# Patient Record
Sex: Female | Born: 1935 | Race: White | Hispanic: No | Marital: Married | Smoking: Never smoker
Health system: Southern US, Community
[De-identification: ages and names within clinical notes are randomized; demographics above are authoritative.]

## PROBLEM LIST (undated history)

## (undated) DIAGNOSIS — I779 Disorder of arteries and arterioles, unspecified: Secondary | ICD-10-CM

## (undated) DIAGNOSIS — I739 Peripheral vascular disease, unspecified: Secondary | ICD-10-CM

## (undated) DIAGNOSIS — G8103 Flaccid hemiplegia affecting right nondominant side: Secondary | ICD-10-CM

## (undated) DIAGNOSIS — G20A1 Parkinson's disease without dyskinesia, without mention of fluctuations: Secondary | ICD-10-CM

## (undated) DIAGNOSIS — M6259 Muscle wasting and atrophy, not elsewhere classified, multiple sites: Secondary | ICD-10-CM

## (undated) DIAGNOSIS — I69320 Aphasia following cerebral infarction: Secondary | ICD-10-CM

## (undated) DIAGNOSIS — I639 Cerebral infarction, unspecified: Secondary | ICD-10-CM

## (undated) DIAGNOSIS — R41841 Cognitive communication deficit: Secondary | ICD-10-CM

## (undated) DIAGNOSIS — G20 Parkinson's disease: Secondary | ICD-10-CM

## (undated) DIAGNOSIS — R627 Adult failure to thrive: Secondary | ICD-10-CM

## (undated) DIAGNOSIS — E559 Vitamin D deficiency, unspecified: Secondary | ICD-10-CM

## (undated) DIAGNOSIS — N189 Chronic kidney disease, unspecified: Secondary | ICD-10-CM

## (undated) DIAGNOSIS — I10 Essential (primary) hypertension: Secondary | ICD-10-CM

## (undated) DIAGNOSIS — E785 Hyperlipidemia, unspecified: Secondary | ICD-10-CM

## (undated) HISTORY — DX: Flaccid hemiplegia affecting right nondominant side: G81.03

## (undated) HISTORY — DX: Aphasia following cerebral infarction: I69.320

## (undated) HISTORY — PX: CATARACT EXTRACTION: SUR2

## (undated) HISTORY — DX: Cerebral infarction, unspecified: I63.9

## (undated) HISTORY — DX: Essential (primary) hypertension: I10

## (undated) HISTORY — DX: Adult failure to thrive: R62.7

## (undated) HISTORY — DX: Hyperlipidemia, unspecified: E78.5

## (undated) HISTORY — DX: Disorder of arteries and arterioles, unspecified: I77.9

## (undated) HISTORY — DX: Parkinson's disease: G20

## (undated) HISTORY — DX: Muscle wasting and atrophy, not elsewhere classified, multiple sites: M62.59

## (undated) HISTORY — DX: Chronic kidney disease, unspecified: N18.9

## (undated) HISTORY — DX: Peripheral vascular disease, unspecified: I73.9

## (undated) HISTORY — DX: Vitamin D deficiency, unspecified: E55.9

## (undated) HISTORY — DX: Parkinson's disease without dyskinesia, without mention of fluctuations: G20.A1

## (undated) HISTORY — DX: Cognitive communication deficit: R41.841

---

## 1957-02-10 HISTORY — PX: THYROIDECTOMY: SHX17

## 1998-07-13 ENCOUNTER — Other Ambulatory Visit: Admission: RE | Admit: 1998-07-13 | Discharge: 1998-07-13 | Payer: Self-pay | Admitting: Internal Medicine

## 1999-12-04 ENCOUNTER — Other Ambulatory Visit: Admission: RE | Admit: 1999-12-04 | Discharge: 1999-12-04 | Payer: Self-pay | Admitting: Internal Medicine

## 2003-08-11 ENCOUNTER — Ambulatory Visit (HOSPITAL_COMMUNITY): Admission: RE | Admit: 2003-08-11 | Discharge: 2003-08-11 | Payer: Self-pay | Admitting: *Deleted

## 2007-04-29 ENCOUNTER — Encounter: Payer: Self-pay | Admitting: Internal Medicine

## 2007-05-06 ENCOUNTER — Ambulatory Visit (HOSPITAL_COMMUNITY): Admission: RE | Admit: 2007-05-06 | Discharge: 2007-05-06 | Payer: Self-pay | Admitting: Interventional Radiology

## 2008-02-14 ENCOUNTER — Ambulatory Visit: Payer: Self-pay | Admitting: Internal Medicine

## 2008-12-11 HISTORY — PX: NM MYOCAR PERF WALL MOTION: HXRAD629

## 2009-01-12 ENCOUNTER — Emergency Department (HOSPITAL_COMMUNITY): Admission: EM | Admit: 2009-01-12 | Discharge: 2009-01-12 | Payer: Self-pay | Admitting: Emergency Medicine

## 2010-05-09 HISTORY — PX: US ECHOCARDIOGRAPHY: HXRAD669

## 2010-05-14 LAB — POCT I-STAT, CHEM 8
BUN: 32 mg/dL — ABNORMAL HIGH (ref 6–23)
Chloride: 108 mEq/L (ref 96–112)
HCT: 34 % — ABNORMAL LOW (ref 36.0–46.0)
Potassium: 4 mEq/L (ref 3.5–5.1)

## 2010-05-14 LAB — DIFFERENTIAL
Basophils Absolute: 0 10*3/uL (ref 0.0–0.1)
Basophils Relative: 0 % (ref 0–1)
Lymphocytes Relative: 20 % (ref 12–46)
Monocytes Absolute: 0.5 10*3/uL (ref 0.1–1.0)
Neutro Abs: 5.5 10*3/uL (ref 1.7–7.7)
Neutrophils Relative %: 71 % (ref 43–77)

## 2010-05-14 LAB — POCT CARDIAC MARKERS
CKMB, poc: 1.4 ng/mL (ref 1.0–8.0)
Myoglobin, poc: 160 ng/mL (ref 12–200)
Troponin i, poc: <0.05 ng/mL (ref 0.00–0.09)

## 2010-05-14 LAB — CBC
Hemoglobin: 11.4 g/dL — ABNORMAL LOW (ref 12.0–15.0)
MCHC: 34 g/dL (ref 30.0–36.0)
RBC: 3.63 MIL/uL — ABNORMAL LOW (ref 3.87–5.11)
RDW: 13.5 % (ref 11.5–15.5)

## 2010-06-25 NOTE — Consult Note (Signed)
NAME:  Maria Hanna, Maria Hanna               ACCOUNT NO.:  [id number]   MEDICAL RECORD NO.:  [id number]          PATIENT TYPE:  OUT   LOCATION:  XRAY                         FACILITY:  [facility]   PHYSICIAN:  Delton See, P.A.   DATE OF BIRTH:  Sep 04, 1925   DATE OF CONSULTATION:  04/29/2007  DATE OF DISCHARGE:                                 CONSULTATION   CHIEF COMPLAINT:  Cerebrovascular disease.   HISTORY OF PRESENT ILLNESS:  This is a very pleasant 77 year old female  referred to Dr. Corliss Skains through the courtesy of Dr. Jerrel Ivory after  the patient was admitted to [facility] on April 24, 2007 with a  left CVA.  The patient fortunately has coordination problems with her  right hand.   During her stay at [facility] she had an MRI, MRA that did reveal  an occluded left internal carotid artery as well as an occluded right  vertebral artery. Cerebral angiogram has been recommended.  The patient  has been treated with aspirin and Plavix.  She has had no further  symptoms.  She presents today accompanied by her husband to meet with  Dr. Corliss Skains to discuss further treatment recommendations.   PAST MEDICAL HISTORY:  Significant for hyperlipidemia, her recent left  CVA, hypertension, osteoporosis.  She has previously seen Dr. Jacinto Halim for  a 2-D echo that revealed minimal valvular abnormalities with an ejection  fraction of 70%.  She also previously had a treadmill stress test.   PAST SURGICAL HISTORY:  Significant for a subtotal thyroidectomy as well  as cataract surgery.  The patient denies previous problems with  anesthesia.   ALLERGIES:  No known drug allergies.  She denies allergies to latex,  shrimp, iodine or shellfish.   CURRENT MEDICATIONS:  Include Plavix 75 mg daily, calcium 500 mg daily,  Fosamax once weekly, Lovaza 1000 mg daily, Crestor 5 mg daily, Toprol XL  50 mg daily, Xforge 160 mg daily, a vitamin for her eyes one daily, and  aspirin 325 mg daily.  The  patient has never smoked.  She does not use  alcohol.  She is retired from the [employer] and she also worked  at the [employer]   FAMILY HISTORY:  Mother died at age 59 from natural causes. Her father  died at age 55 from renal cancer.   IMPRESSION:  As noted the patient presents today for further discussions  with Dr. Corliss Skains regarding her cerebrovascular disease.  Dr. Corliss Skains  reviewed the results of the MRI, MRA performed April 26, 2007 at  [facility].  He pointed out the occluded left internal carotid  artery as well as the suspected occluded right vertebral artery.  Based  on the MRI, MRA results it could not be determined with certainty that  these arteries were occluded.  Therefore a cerebral angiogram has been  recommended.  The cerebral angiogram was described in detail  along with the risks and potential benefits. All of their questions have  been answered.  We also discussed possible PTA stenting if felt to be  safe and indicated.  We have tentatively scheduled the patient for  cerebral angiogram next Thursday. Greater than 40 minutes was spent on  this consult.      Delton See, P.A.     DR/MEDQ  D:  04/29/2007  T:  04/30/2007  Job:  [id number]   cc:   Victoriano Lain, MD  Lenon Curt. Chilton Si, M.D.  Jerrel Ivory, MD  Cristy Hilts. Jacinto Halim, MD

## 2010-06-25 NOTE — Assessment & Plan Note (Signed)
[facility]                         ELECTROPHYSIOLOGY OFFICE NOTE   NAME:JOHNSONYudith, Norlander                      MRN:          [id number]  DATE:02/14/2008                            DOB:          [birth year]    HISTORY OF PRESENT ILLNESS:  Ms. Texidor is a new patient of mine.  She  is referred for evaluation of a tachy-brady symptoms.  The patient has a  history of stroke and she has known carotid and vertebral artery disease  with documented occluded right vertebral artery and an occluded left  internal carotid by MRI and cerebral angiogram.  For this, she has been  treated with aspirin and Plavix.  She has rare palpitations.  She has  never had frank syncope.  She was seen by Dr. Yates Decamp and had a 3-week  CardioNet monitor obtained, this demonstrated asymptomatic bradycardia  down into the 50s.  It also demonstrated nonsustained tachycardia at a  rate of 130 beats per minute.  This was classified as ventricular  tachycardia though it certainly could be apparent SVT.  The etiology of  the arrhythmia is unclear.  Despite this, the patient has never had  frank syncope and really does not feel much in the way of symptomatic  palpitations.  If anything, these are not particularly bothersome to her  and are not associated with much in the way of symptoms.  The patient  recently underwent stress Myoview testing, which demonstrated normal LV  systolic function and normal perfusion in all coronary distribution.  The patient had normal renal arteries on vascular study.  She denies  chest pain or shortness of breath and she denies frank syncope.   PAST MEDICAL HISTORY:  Notable for hypertension.  She has dyslipidemia.  She has a history of osteoporosis.   PAST SURGICAL HISTORY:  Notable for subtotal thyroidectomy and cataract  surgery in the past.   ALLERGIES:  She had no known drug allergies.   MEDICATIONS:  Her medications include,  1. Lovaza 1 g 4 times  daily.  2. Maxzide 25 mg half tablet daily.  3. Lisinopril 40 a day.  4. Plavix 75 a day.  5. Aspirin 81 a day.  6. Fosamax 70 a day.   FAMILY HISTORY:  Noncontributory at her advanced age.   SOCIAL HISTORY:  The patient denies tobacco or ethanol abuse.   REVIEW OF SYSTEMS:  Review of systems are negative except as noted in  the HPI.   PHYSICAL EXAMINATION:  GENERAL:  She is a pleasant, elderly-appearing  woman, in no acute distress.  VITAL SIGNS:  The blood pressure was 100/60, pulse was 77 and regular,  respirations were 18, the weight was 135 pounds.  HEENT:  Normocephalic  and atraumatic.  She is wearing glasses.  The oropharynx is moist.  Sclerae anicteric.  NECK:  Revealed no jugular venous distention.  There is no thyromegaly.  Trachea was midline.  LUNGS:  Clear bilaterally to auscultation.  No wheezes, rales, or  rhonchi were appreciated.  There is no increased work of breathing.  CARDIOVASCULAR:  Revealed a regular rate and rhythm.  Normal S1 and S2.  The PMI is not enlarged nor was it laterally displaced.  ABDOMEN:  Soft and nontender.  There is no organomegaly.  EXTREMITIES:  Demonstrated no cyanosis, clubbing, or edema.  NEUROLOGIC:  The patient's right arm and right leg were minimally weaker  than the left.  She has a slight tremor on the right arm.  Otherwise,  neurologic exam was normal.   IMPRESSION:  1. Minimally symptomatic palpitations.  2. Cerebral vascular disease with an occluded carotid as well as      vertebral artery.  3. Wide QRS tachycardia most likely nonsustained atrial flutter versus      supraventricular tachycardia with aberration versus ventricular      tachycardia.  4. Sinus bradycardia, also minimally if at all symptomatic.   DISCUSSION:  I have discussed treatment options with the patient in  detail. The thing that I am most struck by is her paucity of symptoms.  I am not sure whether she is just playing this down or whether she truly   is asymptomatic, but as best I can tell she is minimally if at all  symptomatic from this.  With her cerebrovascular disease, aspirin and  Plavix are indicated and I think there is very little if any advantage,  maybe of significant disadvantage in terms of bleeding risk by  initiation of Coumadin, because she has not had much documented in the  way of symptomatic arrhythmias, I would recommend at this point a period  of watchful waiting.  I would be reluctant to recommend catheter  ablation, as I think the likelihood of reducing her thromboembolic risk  with this is minimal and that she is fairly asymptomatic suggested we  would not be doing much for her symptoms.  With regard to her  bradycardia, again she is minimally if at all symptomatic from  bradycardia, and I think that some day, she may ultimately require  permanent pacemaker insertion, she presently does not have a clear-cut  indication for one.  I will plan on discussing all these issues with her  primary cardiologist, Dr. Jacinto Halim and we will see her back as needed.     Doylene Canning. Ladona Ridgel, MD  Electronically Signed    GWT/MedQ  DD: 02/14/2008  DT: 02/15/2008  Job #: [id number]   cc:   Cristy Hilts. Jacinto Halim, MD  Lonie Peak, PA

## 2010-06-28 NOTE — Op Note (Signed)
NAME:  Maria Hanna, Maria Hanna                         ACCOUNT NO.:  [id number]   MEDICAL RECORD NO.:  [id number]                   PATIENT TYPE:  AMB   LOCATION:  ENDO                                 FACILITY:  [facility]   PHYSICIAN:  Georgiana Spinner, M.D.                 DATE OF BIRTH:  09/06/1925   DATE OF PROCEDURE:  08/11/2003  DATE OF DISCHARGE:                                 OPERATIVE REPORT   ADDENDUM:                                               Georgiana Spinner, M.D.    GMO/MEDQ  D:  08/11/2003  T:  08/11/2003  Job:  [id number]   cc:   Lenon Curt. Chilton Si, M.D.  [address]  [place]  [place] [address]  Fax: [phone]

## 2010-06-28 NOTE — Op Note (Signed)
NAME:  Maria Hanna, Maria Hanna                         ACCOUNT NO.:  [id number]   MEDICAL RECORD NO.:  [id number]                   PATIENT TYPE:  AMB   LOCATION:  ENDO                                 FACILITY:  [facility]   PHYSICIAN:  Georgiana Spinner, M.D.                 DATE OF BIRTH:  09/27/1925   DATE OF PROCEDURE:  DATE OF DISCHARGE:                                 OPERATIVE REPORT   PROCEDURE:  Colonoscopy.   INDICATIONS:  Colon polyp.   ANESTHESIA:  Demerol 60 mg, Versed 6 mg.   DESCRIPTION OF PROCEDURE:  With the patient mildly sedated and in the left  lateral decubitus position, the Olympus videoscopic colonoscope was inserted  in the rectum and passed through a tortuous sigmoid colon to the cecum,  identified by ileocecal valve and appendiceal orifice, both of which were  photographed.  From this point,  the colonoscope was slowly withdrawn,  taking circumferential views of the colonic mucosa, stopping only in the  rectum which appeared normal on direct and showed hemorrhoids on retroflexed  view.  The endoscope was straightened and withdrawn.  The patient's vital  signs and pulse oximetry remained stable.  The patient tolerated the  procedure well without apparent complications.   FINDINGS:  Internal hemorrhoids and some diverticula seen in the sigmoid  colon; otherwise unremarkable exam.   PLAN:  Repeat examination possibly in five years.                                               Georgiana Spinner, M.D.    GMO/MEDQ  D:  08/11/2003  T:  08/11/2003  Job:  [id number]

## 2010-11-04 LAB — BASIC METABOLIC PANEL
BUN: 11
Calcium: 9.1
Creatinine, Ser: 0.81
GFR calc Af Amer: >60
GFR calc non Af Amer: >60

## 2010-11-04 LAB — PROTIME-INR
INR: 0.9
Prothrombin Time: 11.8

## 2010-11-04 LAB — CBC
MCV: 90.3
Platelets: 244
RBC: 4.06
WBC: 6.8

## 2011-02-19 DIAGNOSIS — M999 Biomechanical lesion, unspecified: Secondary | ICD-10-CM | POA: Diagnosis not present

## 2011-02-19 DIAGNOSIS — M5430 Sciatica, unspecified side: Secondary | ICD-10-CM | POA: Diagnosis not present

## 2011-03-10 DIAGNOSIS — D500 Iron deficiency anemia secondary to blood loss (chronic): Secondary | ICD-10-CM | POA: Diagnosis not present

## 2011-03-24 DIAGNOSIS — I6529 Occlusion and stenosis of unspecified carotid artery: Secondary | ICD-10-CM | POA: Diagnosis not present

## 2011-03-24 DIAGNOSIS — I119 Hypertensive heart disease without heart failure: Secondary | ICD-10-CM | POA: Diagnosis not present

## 2011-04-09 DIAGNOSIS — D500 Iron deficiency anemia secondary to blood loss (chronic): Secondary | ICD-10-CM | POA: Diagnosis not present

## 2011-04-16 DIAGNOSIS — H3530 Unspecified macular degeneration: Secondary | ICD-10-CM | POA: Diagnosis not present

## 2011-04-16 DIAGNOSIS — Z961 Presence of intraocular lens: Secondary | ICD-10-CM | POA: Diagnosis not present

## 2011-04-21 DIAGNOSIS — M5430 Sciatica, unspecified side: Secondary | ICD-10-CM | POA: Diagnosis not present

## 2011-04-21 DIAGNOSIS — M999 Biomechanical lesion, unspecified: Secondary | ICD-10-CM | POA: Diagnosis not present

## 2011-04-29 DIAGNOSIS — R195 Other fecal abnormalities: Secondary | ICD-10-CM | POA: Diagnosis not present

## 2011-04-29 DIAGNOSIS — D509 Iron deficiency anemia, unspecified: Secondary | ICD-10-CM | POA: Diagnosis not present

## 2011-05-06 DIAGNOSIS — I10 Essential (primary) hypertension: Secondary | ICD-10-CM | POA: Diagnosis not present

## 2011-05-06 DIAGNOSIS — M549 Dorsalgia, unspecified: Secondary | ICD-10-CM | POA: Diagnosis not present

## 2011-05-06 DIAGNOSIS — G20 Parkinson's disease: Secondary | ICD-10-CM | POA: Diagnosis not present

## 2011-05-06 DIAGNOSIS — I69959 Hemiplegia and hemiparesis following unspecified cerebrovascular disease affecting unspecified side: Secondary | ICD-10-CM | POA: Diagnosis not present

## 2011-05-06 DIAGNOSIS — Z79899 Other long term (current) drug therapy: Secondary | ICD-10-CM | POA: Diagnosis not present

## 2011-05-06 DIAGNOSIS — E782 Mixed hyperlipidemia: Secondary | ICD-10-CM | POA: Diagnosis not present

## 2011-05-21 DIAGNOSIS — M999 Biomechanical lesion, unspecified: Secondary | ICD-10-CM | POA: Diagnosis not present

## 2011-05-21 DIAGNOSIS — M5430 Sciatica, unspecified side: Secondary | ICD-10-CM | POA: Diagnosis not present

## 2011-06-18 DIAGNOSIS — M5430 Sciatica, unspecified side: Secondary | ICD-10-CM | POA: Diagnosis not present

## 2011-06-18 DIAGNOSIS — D500 Iron deficiency anemia secondary to blood loss (chronic): Secondary | ICD-10-CM | POA: Diagnosis not present

## 2011-06-18 DIAGNOSIS — M999 Biomechanical lesion, unspecified: Secondary | ICD-10-CM | POA: Diagnosis not present

## 2011-07-11 DIAGNOSIS — Z01818 Encounter for other preprocedural examination: Secondary | ICD-10-CM | POA: Diagnosis not present

## 2011-07-11 DIAGNOSIS — IMO0002 Reserved for concepts with insufficient information to code with codable children: Secondary | ICD-10-CM | POA: Diagnosis not present

## 2011-07-11 DIAGNOSIS — I495 Sick sinus syndrome: Secondary | ICD-10-CM | POA: Diagnosis not present

## 2011-07-11 DIAGNOSIS — M479 Spondylosis, unspecified: Secondary | ICD-10-CM | POA: Diagnosis not present

## 2011-07-11 DIAGNOSIS — M1710 Unilateral primary osteoarthritis, unspecified knee: Secondary | ICD-10-CM | POA: Diagnosis not present

## 2011-07-16 DIAGNOSIS — M5430 Sciatica, unspecified side: Secondary | ICD-10-CM | POA: Diagnosis not present

## 2011-07-16 DIAGNOSIS — M999 Biomechanical lesion, unspecified: Secondary | ICD-10-CM | POA: Diagnosis not present

## 2011-07-17 DIAGNOSIS — M1710 Unilateral primary osteoarthritis, unspecified knee: Secondary | ICD-10-CM | POA: Diagnosis not present

## 2011-08-13 DIAGNOSIS — M5430 Sciatica, unspecified side: Secondary | ICD-10-CM | POA: Diagnosis not present

## 2011-08-13 DIAGNOSIS — M999 Biomechanical lesion, unspecified: Secondary | ICD-10-CM | POA: Diagnosis not present

## 2011-08-18 DIAGNOSIS — M1710 Unilateral primary osteoarthritis, unspecified knee: Secondary | ICD-10-CM | POA: Diagnosis not present

## 2011-09-10 DIAGNOSIS — M5430 Sciatica, unspecified side: Secondary | ICD-10-CM | POA: Diagnosis not present

## 2011-09-10 DIAGNOSIS — M999 Biomechanical lesion, unspecified: Secondary | ICD-10-CM | POA: Diagnosis not present

## 2011-09-18 DIAGNOSIS — D500 Iron deficiency anemia secondary to blood loss (chronic): Secondary | ICD-10-CM | POA: Diagnosis not present

## 2011-09-30 DIAGNOSIS — I6529 Occlusion and stenosis of unspecified carotid artery: Secondary | ICD-10-CM | POA: Diagnosis not present

## 2011-10-14 DIAGNOSIS — D649 Anemia, unspecified: Secondary | ICD-10-CM | POA: Diagnosis not present

## 2011-10-14 DIAGNOSIS — K5900 Constipation, unspecified: Secondary | ICD-10-CM | POA: Diagnosis not present

## 2011-10-14 DIAGNOSIS — D500 Iron deficiency anemia secondary to blood loss (chronic): Secondary | ICD-10-CM | POA: Diagnosis not present

## 2011-10-27 DIAGNOSIS — G609 Hereditary and idiopathic neuropathy, unspecified: Secondary | ICD-10-CM | POA: Diagnosis not present

## 2011-10-27 DIAGNOSIS — M159 Polyosteoarthritis, unspecified: Secondary | ICD-10-CM | POA: Diagnosis not present

## 2011-10-27 DIAGNOSIS — M25561 Pain in right knee: Secondary | ICD-10-CM | POA: Insufficient documentation

## 2011-10-27 DIAGNOSIS — M25569 Pain in unspecified knee: Secondary | ICD-10-CM | POA: Diagnosis not present

## 2011-11-05 DIAGNOSIS — M5430 Sciatica, unspecified side: Secondary | ICD-10-CM | POA: Diagnosis not present

## 2011-11-05 DIAGNOSIS — M999 Biomechanical lesion, unspecified: Secondary | ICD-10-CM | POA: Diagnosis not present

## 2011-11-06 DIAGNOSIS — I69959 Hemiplegia and hemiparesis following unspecified cerebrovascular disease affecting unspecified side: Secondary | ICD-10-CM | POA: Diagnosis not present

## 2011-11-06 DIAGNOSIS — E782 Mixed hyperlipidemia: Secondary | ICD-10-CM | POA: Diagnosis not present

## 2011-11-06 DIAGNOSIS — I10 Essential (primary) hypertension: Secondary | ICD-10-CM | POA: Diagnosis not present

## 2011-11-06 DIAGNOSIS — Z23 Encounter for immunization: Secondary | ICD-10-CM | POA: Diagnosis not present

## 2011-11-06 DIAGNOSIS — D509 Iron deficiency anemia, unspecified: Secondary | ICD-10-CM | POA: Diagnosis not present

## 2011-12-03 DIAGNOSIS — M999 Biomechanical lesion, unspecified: Secondary | ICD-10-CM | POA: Diagnosis not present

## 2011-12-03 DIAGNOSIS — M5430 Sciatica, unspecified side: Secondary | ICD-10-CM | POA: Diagnosis not present

## 2011-12-10 DIAGNOSIS — G8929 Other chronic pain: Secondary | ICD-10-CM | POA: Diagnosis not present

## 2011-12-10 DIAGNOSIS — M1710 Unilateral primary osteoarthritis, unspecified knee: Secondary | ICD-10-CM | POA: Diagnosis not present

## 2011-12-10 DIAGNOSIS — G609 Hereditary and idiopathic neuropathy, unspecified: Secondary | ICD-10-CM | POA: Diagnosis not present

## 2011-12-10 DIAGNOSIS — G5790 Unspecified mononeuropathy of unspecified lower limb: Secondary | ICD-10-CM | POA: Diagnosis not present

## 2011-12-10 DIAGNOSIS — M25569 Pain in unspecified knee: Secondary | ICD-10-CM | POA: Diagnosis not present

## 2011-12-12 DIAGNOSIS — I10 Essential (primary) hypertension: Secondary | ICD-10-CM | POA: Diagnosis not present

## 2011-12-12 DIAGNOSIS — R404 Transient alteration of awareness: Secondary | ICD-10-CM | POA: Diagnosis not present

## 2011-12-12 DIAGNOSIS — R5383 Other fatigue: Secondary | ICD-10-CM | POA: Diagnosis not present

## 2011-12-12 DIAGNOSIS — I69998 Other sequelae following unspecified cerebrovascular disease: Secondary | ICD-10-CM | POA: Diagnosis not present

## 2011-12-12 DIAGNOSIS — R5381 Other malaise: Secondary | ICD-10-CM | POA: Diagnosis not present

## 2011-12-12 DIAGNOSIS — D649 Anemia, unspecified: Secondary | ICD-10-CM | POA: Diagnosis not present

## 2011-12-12 DIAGNOSIS — R55 Syncope and collapse: Secondary | ICD-10-CM | POA: Diagnosis not present

## 2011-12-12 DIAGNOSIS — I2510 Atherosclerotic heart disease of native coronary artery without angina pectoris: Secondary | ICD-10-CM | POA: Diagnosis not present

## 2011-12-12 DIAGNOSIS — A499 Bacterial infection, unspecified: Secondary | ICD-10-CM | POA: Diagnosis not present

## 2011-12-12 DIAGNOSIS — N390 Urinary tract infection, site not specified: Secondary | ICD-10-CM | POA: Diagnosis not present

## 2011-12-29 DIAGNOSIS — M159 Polyosteoarthritis, unspecified: Secondary | ICD-10-CM | POA: Diagnosis not present

## 2011-12-29 DIAGNOSIS — M25569 Pain in unspecified knee: Secondary | ICD-10-CM | POA: Diagnosis not present

## 2012-02-13 DIAGNOSIS — G894 Chronic pain syndrome: Secondary | ICD-10-CM

## 2012-02-13 DIAGNOSIS — G5790 Unspecified mononeuropathy of unspecified lower limb: Secondary | ICD-10-CM

## 2012-02-13 DIAGNOSIS — M179 Osteoarthritis of knee, unspecified: Secondary | ICD-10-CM | POA: Insufficient documentation

## 2012-02-13 DIAGNOSIS — M1710 Unilateral primary osteoarthritis, unspecified knee: Secondary | ICD-10-CM | POA: Diagnosis not present

## 2012-02-13 DIAGNOSIS — M25569 Pain in unspecified knee: Secondary | ICD-10-CM | POA: Diagnosis not present

## 2012-02-13 HISTORY — DX: Osteoarthritis of knee, unspecified: M17.9

## 2012-02-13 HISTORY — DX: Unspecified mononeuropathy of unspecified lower limb: G57.90

## 2012-02-13 HISTORY — DX: Chronic pain syndrome: G89.4

## 2012-02-23 DIAGNOSIS — D500 Iron deficiency anemia secondary to blood loss (chronic): Secondary | ICD-10-CM | POA: Diagnosis not present

## 2012-03-01 DIAGNOSIS — N390 Urinary tract infection, site not specified: Secondary | ICD-10-CM | POA: Diagnosis not present

## 2012-03-01 DIAGNOSIS — G20 Parkinson's disease: Secondary | ICD-10-CM | POA: Diagnosis not present

## 2012-03-01 DIAGNOSIS — D509 Iron deficiency anemia, unspecified: Secondary | ICD-10-CM | POA: Diagnosis not present

## 2012-03-01 DIAGNOSIS — R55 Syncope and collapse: Secondary | ICD-10-CM | POA: Diagnosis not present

## 2012-03-02 DIAGNOSIS — D500 Iron deficiency anemia secondary to blood loss (chronic): Secondary | ICD-10-CM | POA: Diagnosis not present

## 2012-03-02 DIAGNOSIS — R195 Other fecal abnormalities: Secondary | ICD-10-CM | POA: Diagnosis not present

## 2012-03-11 ENCOUNTER — Other Ambulatory Visit (HOSPITAL_COMMUNITY): Payer: Self-pay | Admitting: Cardiovascular Disease

## 2012-03-11 DIAGNOSIS — I119 Hypertensive heart disease without heart failure: Secondary | ICD-10-CM | POA: Diagnosis not present

## 2012-03-11 DIAGNOSIS — R5383 Other fatigue: Secondary | ICD-10-CM | POA: Diagnosis not present

## 2012-03-11 DIAGNOSIS — Z79899 Other long term (current) drug therapy: Secondary | ICD-10-CM | POA: Diagnosis not present

## 2012-03-11 DIAGNOSIS — I6529 Occlusion and stenosis of unspecified carotid artery: Secondary | ICD-10-CM

## 2012-03-11 DIAGNOSIS — I359 Nonrheumatic aortic valve disorder, unspecified: Secondary | ICD-10-CM

## 2012-03-11 DIAGNOSIS — E782 Mixed hyperlipidemia: Secondary | ICD-10-CM | POA: Diagnosis not present

## 2012-03-25 ENCOUNTER — Ambulatory Visit (HOSPITAL_COMMUNITY): Payer: Self-pay

## 2012-03-25 ENCOUNTER — Inpatient Hospital Stay (HOSPITAL_COMMUNITY): Admission: RE | Admit: 2012-03-25 | Payer: Self-pay | Source: Ambulatory Visit

## 2012-03-29 ENCOUNTER — Ambulatory Visit (HOSPITAL_COMMUNITY)
Admission: RE | Admit: 2012-03-29 | Discharge: 2012-03-29 | Disposition: A | Discharge status: Home / Self Care | Payer: Medicare Other | Source: Ambulatory Visit | Attending: Cardiovascular Disease | Admitting: Cardiovascular Disease

## 2012-03-29 DIAGNOSIS — I369 Nonrheumatic tricuspid valve disorder, unspecified: Secondary | ICD-10-CM | POA: Insufficient documentation

## 2012-03-29 DIAGNOSIS — I059 Rheumatic mitral valve disease, unspecified: Secondary | ICD-10-CM | POA: Diagnosis not present

## 2012-03-29 DIAGNOSIS — E782 Mixed hyperlipidemia: Secondary | ICD-10-CM | POA: Insufficient documentation

## 2012-03-29 DIAGNOSIS — I119 Hypertensive heart disease without heart failure: Secondary | ICD-10-CM

## 2012-03-29 DIAGNOSIS — I359 Nonrheumatic aortic valve disorder, unspecified: Secondary | ICD-10-CM | POA: Diagnosis not present

## 2012-03-29 DIAGNOSIS — I319 Disease of pericardium, unspecified: Secondary | ICD-10-CM | POA: Insufficient documentation

## 2012-03-29 DIAGNOSIS — I6529 Occlusion and stenosis of unspecified carotid artery: Secondary | ICD-10-CM | POA: Diagnosis not present

## 2012-03-29 NOTE — Progress Notes (Signed)
2D Echo Performed 03/29/2012    Davell Beckstead, RCS  

## 2012-03-29 NOTE — Progress Notes (Signed)
Carotid Duplex Complete Maria Hanna 

## 2012-04-01 DIAGNOSIS — M25569 Pain in unspecified knee: Secondary | ICD-10-CM | POA: Diagnosis not present

## 2012-04-01 DIAGNOSIS — G894 Chronic pain syndrome: Secondary | ICD-10-CM | POA: Diagnosis not present

## 2012-04-01 DIAGNOSIS — M1710 Unilateral primary osteoarthritis, unspecified knee: Secondary | ICD-10-CM | POA: Diagnosis not present

## 2012-04-06 DIAGNOSIS — D509 Iron deficiency anemia, unspecified: Secondary | ICD-10-CM | POA: Diagnosis not present

## 2012-04-20 DIAGNOSIS — Z961 Presence of intraocular lens: Secondary | ICD-10-CM | POA: Diagnosis not present

## 2012-04-20 DIAGNOSIS — H3530 Unspecified macular degeneration: Secondary | ICD-10-CM | POA: Diagnosis not present

## 2012-04-29 DIAGNOSIS — M25569 Pain in unspecified knee: Secondary | ICD-10-CM | POA: Diagnosis not present

## 2012-04-29 DIAGNOSIS — D500 Iron deficiency anemia secondary to blood loss (chronic): Secondary | ICD-10-CM | POA: Diagnosis not present

## 2012-04-29 DIAGNOSIS — M1710 Unilateral primary osteoarthritis, unspecified knee: Secondary | ICD-10-CM | POA: Diagnosis not present

## 2012-04-29 DIAGNOSIS — G894 Chronic pain syndrome: Secondary | ICD-10-CM | POA: Diagnosis not present

## 2012-05-05 DIAGNOSIS — I10 Essential (primary) hypertension: Secondary | ICD-10-CM | POA: Diagnosis not present

## 2012-05-05 DIAGNOSIS — D509 Iron deficiency anemia, unspecified: Secondary | ICD-10-CM | POA: Diagnosis not present

## 2012-05-05 DIAGNOSIS — K2970 Gastritis, unspecified, without bleeding: Secondary | ICD-10-CM | POA: Diagnosis not present

## 2012-05-05 DIAGNOSIS — E782 Mixed hyperlipidemia: Secondary | ICD-10-CM | POA: Diagnosis not present

## 2012-05-07 DIAGNOSIS — I6529 Occlusion and stenosis of unspecified carotid artery: Secondary | ICD-10-CM | POA: Diagnosis not present

## 2012-05-07 DIAGNOSIS — E785 Hyperlipidemia, unspecified: Secondary | ICD-10-CM | POA: Diagnosis not present

## 2012-05-07 DIAGNOSIS — I119 Hypertensive heart disease without heart failure: Secondary | ICD-10-CM | POA: Diagnosis not present

## 2012-07-14 DIAGNOSIS — M1710 Unilateral primary osteoarthritis, unspecified knee: Secondary | ICD-10-CM | POA: Diagnosis not present

## 2012-07-14 DIAGNOSIS — G894 Chronic pain syndrome: Secondary | ICD-10-CM | POA: Diagnosis not present

## 2012-07-28 DIAGNOSIS — D509 Iron deficiency anemia, unspecified: Secondary | ICD-10-CM | POA: Diagnosis not present

## 2012-08-03 ENCOUNTER — Other Ambulatory Visit: Payer: Self-pay | Admitting: *Deleted

## 2012-08-03 MED ORDER — FENOFIBRATE 145 MG PO TABS: 145.0000 mg | ORAL_TABLET | Freq: Every day | ORAL | Status: DC

## 2012-08-03 NOTE — Telephone Encounter (Signed)
Fenofibrate refilled electronically

## 2012-08-10 DIAGNOSIS — J209 Acute bronchitis, unspecified: Secondary | ICD-10-CM | POA: Diagnosis not present

## 2012-08-10 DIAGNOSIS — H6120 Impacted cerumen, unspecified ear: Secondary | ICD-10-CM | POA: Diagnosis not present

## 2012-08-16 DIAGNOSIS — N189 Chronic kidney disease, unspecified: Secondary | ICD-10-CM | POA: Diagnosis not present

## 2012-08-16 DIAGNOSIS — E0789 Other specified disorders of thyroid: Secondary | ICD-10-CM | POA: Diagnosis not present

## 2012-08-16 DIAGNOSIS — D649 Anemia, unspecified: Secondary | ICD-10-CM | POA: Diagnosis not present

## 2012-08-23 DIAGNOSIS — M5430 Sciatica, unspecified side: Secondary | ICD-10-CM | POA: Diagnosis not present

## 2012-08-23 DIAGNOSIS — M999 Biomechanical lesion, unspecified: Secondary | ICD-10-CM | POA: Diagnosis not present

## 2012-08-24 DIAGNOSIS — N189 Chronic kidney disease, unspecified: Secondary | ICD-10-CM | POA: Diagnosis not present

## 2012-08-24 DIAGNOSIS — N039 Chronic nephritic syndrome with unspecified morphologic changes: Secondary | ICD-10-CM | POA: Diagnosis not present

## 2012-08-24 DIAGNOSIS — D631 Anemia in chronic kidney disease: Secondary | ICD-10-CM | POA: Diagnosis not present

## 2012-09-06 ENCOUNTER — Other Ambulatory Visit (HOSPITAL_COMMUNITY): Payer: Self-pay | Admitting: Cardiovascular Disease

## 2012-09-06 DIAGNOSIS — I6521 Occlusion and stenosis of right carotid artery: Secondary | ICD-10-CM

## 2012-09-07 DIAGNOSIS — N189 Chronic kidney disease, unspecified: Secondary | ICD-10-CM | POA: Diagnosis not present

## 2012-09-07 DIAGNOSIS — D631 Anemia in chronic kidney disease: Secondary | ICD-10-CM | POA: Diagnosis not present

## 2012-09-13 ENCOUNTER — Ambulatory Visit (HOSPITAL_COMMUNITY)
Admission: RE | Admit: 2012-09-13 | Discharge: 2012-09-13 | Disposition: A | Discharge status: Home / Self Care | Payer: Medicare Other | Source: Ambulatory Visit | Attending: Cardiology | Admitting: Cardiology

## 2012-09-13 DIAGNOSIS — I6521 Occlusion and stenosis of right carotid artery: Secondary | ICD-10-CM

## 2012-09-13 DIAGNOSIS — I6529 Occlusion and stenosis of unspecified carotid artery: Secondary | ICD-10-CM | POA: Insufficient documentation

## 2012-09-13 NOTE — Progress Notes (Signed)
Carotid Duplex Completed. Shardae Kleinman, BS, RDMS, RVT  

## 2012-09-20 DIAGNOSIS — M999 Biomechanical lesion, unspecified: Secondary | ICD-10-CM | POA: Diagnosis not present

## 2012-09-20 DIAGNOSIS — M5430 Sciatica, unspecified side: Secondary | ICD-10-CM | POA: Diagnosis not present

## 2012-09-21 DIAGNOSIS — D649 Anemia, unspecified: Secondary | ICD-10-CM | POA: Diagnosis not present

## 2012-10-18 DIAGNOSIS — M999 Biomechanical lesion, unspecified: Secondary | ICD-10-CM | POA: Diagnosis not present

## 2012-10-18 DIAGNOSIS — M5430 Sciatica, unspecified side: Secondary | ICD-10-CM | POA: Diagnosis not present

## 2012-10-19 DIAGNOSIS — D631 Anemia in chronic kidney disease: Secondary | ICD-10-CM | POA: Diagnosis not present

## 2012-10-19 DIAGNOSIS — N189 Chronic kidney disease, unspecified: Secondary | ICD-10-CM | POA: Diagnosis not present

## 2012-11-02 ENCOUNTER — Other Ambulatory Visit: Payer: Self-pay | Admitting: *Deleted

## 2012-11-02 MED ORDER — FENOFIBRATE 145 MG PO TABS: 145.0000 mg | ORAL_TABLET | Freq: Every day | ORAL | Status: DC

## 2012-11-04 DIAGNOSIS — G20 Parkinson's disease: Secondary | ICD-10-CM | POA: Diagnosis not present

## 2012-11-04 DIAGNOSIS — D509 Iron deficiency anemia, unspecified: Secondary | ICD-10-CM | POA: Diagnosis not present

## 2012-11-04 DIAGNOSIS — I10 Essential (primary) hypertension: Secondary | ICD-10-CM | POA: Diagnosis not present

## 2012-11-04 DIAGNOSIS — N183 Chronic kidney disease, stage 3 unspecified: Secondary | ICD-10-CM | POA: Diagnosis not present

## 2012-11-04 DIAGNOSIS — E782 Mixed hyperlipidemia: Secondary | ICD-10-CM | POA: Diagnosis not present

## 2012-11-04 DIAGNOSIS — I69959 Hemiplegia and hemiparesis following unspecified cerebrovascular disease affecting unspecified side: Secondary | ICD-10-CM | POA: Diagnosis not present

## 2012-11-15 DIAGNOSIS — M999 Biomechanical lesion, unspecified: Secondary | ICD-10-CM | POA: Diagnosis not present

## 2012-11-15 DIAGNOSIS — M5430 Sciatica, unspecified side: Secondary | ICD-10-CM | POA: Diagnosis not present

## 2012-11-17 DIAGNOSIS — D649 Anemia, unspecified: Secondary | ICD-10-CM | POA: Diagnosis not present

## 2012-11-18 DIAGNOSIS — Z23 Encounter for immunization: Secondary | ICD-10-CM | POA: Diagnosis not present

## 2012-12-13 DIAGNOSIS — M5430 Sciatica, unspecified side: Secondary | ICD-10-CM | POA: Diagnosis not present

## 2012-12-13 DIAGNOSIS — M999 Biomechanical lesion, unspecified: Secondary | ICD-10-CM | POA: Diagnosis not present

## 2012-12-20 DIAGNOSIS — D631 Anemia in chronic kidney disease: Secondary | ICD-10-CM | POA: Diagnosis not present

## 2012-12-20 DIAGNOSIS — N189 Chronic kidney disease, unspecified: Secondary | ICD-10-CM | POA: Diagnosis not present

## 2012-12-22 ENCOUNTER — Ambulatory Visit: Payer: No Typology Code available for payment source | Admitting: Cardiovascular Disease

## 2012-12-23 ENCOUNTER — Encounter: Payer: Self-pay | Admitting: Cardiovascular Disease

## 2012-12-23 ENCOUNTER — Ambulatory Visit (INDEPENDENT_AMBULATORY_CARE_PROVIDER_SITE_OTHER): Payer: Medicare Other | Admitting: Cardiovascular Disease

## 2012-12-23 VITALS — BP 140/62 | HR 60 | Ht 62.0 in | Wt 132.5 lb

## 2012-12-23 DIAGNOSIS — R011 Cardiac murmur, unspecified: Secondary | ICD-10-CM

## 2012-12-23 DIAGNOSIS — I6529 Occlusion and stenosis of unspecified carotid artery: Secondary | ICD-10-CM | POA: Diagnosis not present

## 2012-12-23 DIAGNOSIS — I10 Essential (primary) hypertension: Secondary | ICD-10-CM

## 2012-12-23 DIAGNOSIS — I6521 Occlusion and stenosis of right carotid artery: Secondary | ICD-10-CM

## 2012-12-23 DIAGNOSIS — D649 Anemia, unspecified: Secondary | ICD-10-CM

## 2012-12-23 DIAGNOSIS — E785 Hyperlipidemia, unspecified: Secondary | ICD-10-CM

## 2012-12-23 DIAGNOSIS — K219 Gastro-esophageal reflux disease without esophagitis: Secondary | ICD-10-CM

## 2012-12-23 NOTE — Patient Instructions (Signed)
Your physician recommends that you schedule a follow-up appointment in: feb 2015  Your physician has requested that you have a carotid duplex. This test is an ultrasound of the carotid arteries in your neck. It looks at blood flow through these arteries that supply the brain with blood. Allow one hour for this exam. There are no restrictions or special instructions. Feb 2015   Your physician has requested that you have an echocardiogram. Echocardiography is a painless test that uses sound waves to create images of your heart. It provides your doctor with information about the size and shape of your heart and how well your heart's chambers and valves are working. This procedure takes approximately one hour. There are no restrictions for this procedure. Feb 2015       '

## 2013-01-03 DIAGNOSIS — D631 Anemia in chronic kidney disease: Secondary | ICD-10-CM | POA: Diagnosis not present

## 2013-01-03 DIAGNOSIS — N189 Chronic kidney disease, unspecified: Secondary | ICD-10-CM | POA: Diagnosis not present

## 2013-01-10 DIAGNOSIS — M999 Biomechanical lesion, unspecified: Secondary | ICD-10-CM | POA: Diagnosis not present

## 2013-01-10 DIAGNOSIS — M5430 Sciatica, unspecified side: Secondary | ICD-10-CM | POA: Diagnosis not present

## 2013-01-13 ENCOUNTER — Encounter: Payer: Self-pay | Admitting: Cardiovascular Disease

## 2013-01-13 DIAGNOSIS — I6529 Occlusion and stenosis of unspecified carotid artery: Secondary | ICD-10-CM

## 2013-01-13 DIAGNOSIS — I10 Essential (primary) hypertension: Secondary | ICD-10-CM

## 2013-01-13 DIAGNOSIS — K219 Gastro-esophageal reflux disease without esophagitis: Secondary | ICD-10-CM | POA: Insufficient documentation

## 2013-01-13 DIAGNOSIS — D649 Anemia, unspecified: Secondary | ICD-10-CM | POA: Insufficient documentation

## 2013-01-13 DIAGNOSIS — E785 Hyperlipidemia, unspecified: Secondary | ICD-10-CM | POA: Insufficient documentation

## 2013-01-13 HISTORY — DX: Occlusion and stenosis of unspecified carotid artery: I65.29

## 2013-01-13 HISTORY — DX: Gastro-esophageal reflux disease without esophagitis: K21.9

## 2013-01-13 HISTORY — DX: Essential (primary) hypertension: I10

## 2013-01-13 HISTORY — DX: Hyperlipidemia, unspecified: E78.5

## 2013-01-13 NOTE — Progress Notes (Signed)
Patient ID: Maria Hanna, female   DOB: 1925-10-30, 79 y.o.   MRN: [id number]     HPI: Maria Hanna is a 79 y.o. female who presents for assessment cardiology evaluation.  Maria Hanna has a history of hypertension, hyperlipidemia, as well as carotid disease. She is status post remote CVA. 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 Additional problems include anemia, aortic valve sclerosis, and grade 1 diastolic dysfunction.  Over the past 6 months, she has continued to do well. She is unaware of any episodes of chest pain. She denies any paresthesias. She apparently has seen hematologist and has been put on Procrit for her anemia. She also has a history of GERD and hyperlipidemia.  Past Medical History  Diagnosis Date  . Hypertension   . Hyperlipidemia   . Chronic kidney disease   . Stroke     History reviewed. No pertinent past surgical history.  Allergies  Allergen Reactions  . Atorvastatin Other (See Comments)    Muscle pain    Current Outpatient Prescriptions  Medication Sig Dispense Refill  . aspirin 81 MG tablet Take 81 mg by mouth every other day.      . calcium carbonate (OS-CAL) 600 MG TABS tablet Take 600 mg by mouth 2 (two) times daily with a meal.      . clopidogrel (PLAVIX) 75 MG tablet Take 75 mg by mouth daily with breakfast.      . doxazosin (CARDURA) 2 MG tablet Take 2 mg by mouth daily.      Marland Kitchen Epoetin Alfa (PROCRIT IJ) Inject as directed every 8 (eight) weeks.      . fenofibrate (TRICOR) 145 MG tablet Take 1 tablet (145 mg total) by mouth daily.  90 tablet  0  . lisinopril (PRINIVIL,ZESTRIL) 40 MG tablet Take 40 mg by mouth daily.      . Multiple Vitamins-Minerals (OCUVITE PRESERVISION PO) Take 1 tablet by mouth daily.      . pantoprazole (PROTONIX) 40 MG tablet Take 40 mg by mouth daily.      . psyllium (METAMUCIL) 58.6 % packet Take 1 packet by mouth daily.      . rosuvastatin (CRESTOR) 20 MG  tablet Take 20 mg by mouth daily.      . vitamin C (ASCORBIC ACID) 250 MG tablet Take 250 mg by mouth daily.       No current facility-administered medications for this visit.    History   Social History  . Marital Status: Married    Spouse Name: N/A    Number of Children: N/A  . Years of Education: N/A   Occupational History  . Not on file.   Social History Main Topics  . Smoking status: Never Smoker   . Smokeless tobacco: Never Used  . Alcohol Use: Not on file  . Drug Use: No  . Sexual Activity: Not on file   Other Topics Concern  . Not on file   Social History Narrative  . No narrative on file   Additional social history is notable that she is married and has 2 children and 2 grandchildren. She does not use tobacco or alcohol. She does not routinely exercise.  History reviewed. No pertinent family history.  ROS is negative for fevers, chills or night sweats. She denies skin rash. She denies hearing issues. She denies weakness on one side of the body versus the other. There is no shortness of breath.  She denies presyncope or syncope. There is no chest pressure pressure. At times she does note some mild constipation. She also does have remote GERD. She denies claudication. She denies tremors. She denies rash. She denies known diabetes. She denies cold or heat intolerance.  Other comprehensive 12 point system review is negative.  PE BP 140/62  Pulse 60  Ht 5\' 2"  (1.575 m)  Wt 60.102 kg (132 lb 8 oz)  BMI 24.23 kg/m2  Repeat blood pressure by me 03/05/1968. General: Alert, oriented, no distress.  Skin: normal turgor, no rashes HEENT: Normocephalic, atraumatic. Pupils round and reactive; sclera anicteric;no lid lag.  Nose without nasal septal hypertrophy Mouth/Parynx benign; Mallinpatti scale 2 Neck: No JVD, right carotid bruit. Lungs: clear to ausculatation and percussion; no wheezing or rales Heart: RRR, s1 s2 normal 2/6 systolic murmur in the aortic region  concordant with her aortic sclerosis Abdomen: soft, nontender; no hepatosplenomehaly, BS+; abdominal aorta nontender and not dilated by palpation. Pulses 2+ Extremities: no clubbing cyanosis or edema, Homan's sign negative  Neurologic: grossly nonfocal Psychologic: normal affect and mood.  ECG: Sinus rhythm at 60 beats per minute. No ectopy.  LABS:  BMET    Component Value Date/Time   NA 142 01/12/2009 0545   K 4.0 01/12/2009 0545   CL 108 01/12/2009 0545   CO2 28 05/06/2007 0848   GLUCOSE 102* 01/12/2009 0545   BUN 32* 01/12/2009 0545   CREATININE 1.0 01/12/2009 0545   CALCIUM 9.1 05/06/2007 0848   GFRNONAA >60 05/06/2007 0848   GFRAA  Value: >60        The eGFR has been calculated using the MDRD equation. This calculation has not been validated in all clinical 05/06/2007 0848     Hepatic Function Panel  No results found for this basename: prot, albumin, ast, alt, alkphos, bilitot, bilidir, ibili     CBC    Component Value Date/Time   WBC 7.8 01/12/2009 0535   RBC 3.63* 01/12/2009 0535   HGB 11.6* 01/12/2009 0545   HCT 34.0* 01/12/2009 0545   PLT 190 01/12/2009 0535   MCV 92.3 01/12/2009 0535   MCHC 34.0 01/12/2009 0535   RDW 13.5 01/12/2009 0535   LYMPHSABS 1.6 01/12/2009 0535   MONOABS 0.5 01/12/2009 0535   EOSABS 0.2 01/12/2009 0535   BASOSABS 0.0 01/12/2009 0535     BNP No results found for this basename: probnp    Lipid Panel  No results found for this basename: chol, trig, hdl, cholhdl, vldl, ldlcalc     RADIOLOGY: No results found.    ASSESSMENT AND PLAN: My impression is that Maria Hanna continues to do fairly well. Her blood pressure today is controlled and repeat by me was 124/70. She does have documented total occlusion of her left carotid previous 50 - 69% right carotid stenoses. In February 2015 I am scheduling her for a one-year followup carotid duplex study to make certain she is not developing progressive stenoses on the right. She will also undergo a 2-D  echo Doppler study at that time. I will see her back in followup of the above studies and further recommendations will be made at that time     Lennette Bihari, MD, [facility]  01/13/2013 3:11 PM

## 2013-01-17 ENCOUNTER — Encounter: Payer: Self-pay | Admitting: Cardiovascular Disease

## 2013-01-18 DIAGNOSIS — N189 Chronic kidney disease, unspecified: Secondary | ICD-10-CM | POA: Diagnosis not present

## 2013-01-18 DIAGNOSIS — D631 Anemia in chronic kidney disease: Secondary | ICD-10-CM | POA: Diagnosis not present

## 2013-01-18 DIAGNOSIS — Z09 Encounter for follow-up examination after completed treatment for conditions other than malignant neoplasm: Secondary | ICD-10-CM | POA: Diagnosis not present

## 2013-01-27 DIAGNOSIS — D649 Anemia, unspecified: Secondary | ICD-10-CM | POA: Diagnosis not present

## 2013-01-28 DIAGNOSIS — E538 Deficiency of other specified B group vitamins: Secondary | ICD-10-CM | POA: Diagnosis not present

## 2013-01-31 DIAGNOSIS — E538 Deficiency of other specified B group vitamins: Secondary | ICD-10-CM | POA: Diagnosis not present

## 2013-02-01 DIAGNOSIS — E538 Deficiency of other specified B group vitamins: Secondary | ICD-10-CM | POA: Diagnosis not present

## 2013-02-02 DIAGNOSIS — E538 Deficiency of other specified B group vitamins: Secondary | ICD-10-CM | POA: Diagnosis not present

## 2013-02-04 DIAGNOSIS — E538 Deficiency of other specified B group vitamins: Secondary | ICD-10-CM | POA: Diagnosis not present

## 2013-02-07 DIAGNOSIS — E538 Deficiency of other specified B group vitamins: Secondary | ICD-10-CM | POA: Diagnosis not present

## 2013-02-09 ENCOUNTER — Other Ambulatory Visit: Payer: Self-pay | Admitting: Cardiovascular Disease

## 2013-02-09 NOTE — Telephone Encounter (Signed)
Rx was sent to pharmacy electronically. 

## 2013-02-14 DIAGNOSIS — M999 Biomechanical lesion, unspecified: Secondary | ICD-10-CM | POA: Diagnosis not present

## 2013-02-14 DIAGNOSIS — M5430 Sciatica, unspecified side: Secondary | ICD-10-CM | POA: Diagnosis not present

## 2013-02-15 DIAGNOSIS — E538 Deficiency of other specified B group vitamins: Secondary | ICD-10-CM | POA: Diagnosis not present

## 2013-02-21 DIAGNOSIS — L821 Other seborrheic keratosis: Secondary | ICD-10-CM | POA: Diagnosis not present

## 2013-02-21 DIAGNOSIS — L820 Inflamed seborrheic keratosis: Secondary | ICD-10-CM | POA: Diagnosis not present

## 2013-02-22 DIAGNOSIS — D649 Anemia, unspecified: Secondary | ICD-10-CM | POA: Diagnosis not present

## 2013-02-24 DIAGNOSIS — D509 Iron deficiency anemia, unspecified: Secondary | ICD-10-CM | POA: Diagnosis not present

## 2013-02-24 DIAGNOSIS — N189 Chronic kidney disease, unspecified: Secondary | ICD-10-CM | POA: Diagnosis not present

## 2013-02-24 DIAGNOSIS — D631 Anemia in chronic kidney disease: Secondary | ICD-10-CM | POA: Diagnosis not present

## 2013-02-24 DIAGNOSIS — E538 Deficiency of other specified B group vitamins: Secondary | ICD-10-CM | POA: Diagnosis not present

## 2013-03-01 DIAGNOSIS — D509 Iron deficiency anemia, unspecified: Secondary | ICD-10-CM | POA: Diagnosis not present

## 2013-03-03 DIAGNOSIS — E538 Deficiency of other specified B group vitamins: Secondary | ICD-10-CM | POA: Diagnosis not present

## 2013-03-10 DIAGNOSIS — E538 Deficiency of other specified B group vitamins: Secondary | ICD-10-CM | POA: Diagnosis not present

## 2013-03-14 DIAGNOSIS — M999 Biomechanical lesion, unspecified: Secondary | ICD-10-CM | POA: Diagnosis not present

## 2013-03-14 DIAGNOSIS — M5430 Sciatica, unspecified side: Secondary | ICD-10-CM | POA: Diagnosis not present

## 2013-03-15 DIAGNOSIS — D500 Iron deficiency anemia secondary to blood loss (chronic): Secondary | ICD-10-CM | POA: Diagnosis not present

## 2013-03-16 ENCOUNTER — Ambulatory Visit (HOSPITAL_COMMUNITY)
Admission: RE | Admit: 2013-03-16 | Discharge: 2013-03-16 | Disposition: A | Discharge status: Home / Self Care | Payer: Medicare Other | Source: Ambulatory Visit | Attending: Cardiovascular Disease | Admitting: Cardiovascular Disease

## 2013-03-16 DIAGNOSIS — I6529 Occlusion and stenosis of unspecified carotid artery: Secondary | ICD-10-CM | POA: Diagnosis not present

## 2013-03-16 DIAGNOSIS — I6521 Occlusion and stenosis of right carotid artery: Secondary | ICD-10-CM

## 2013-03-16 NOTE — Progress Notes (Signed)
Carotid Duplex Completed. Bentlie Catanzaro, BS, RDMS, RVT  

## 2013-03-30 ENCOUNTER — Inpatient Hospital Stay (HOSPITAL_COMMUNITY): Admission: RE | Admit: 2013-03-30 | Payer: No Typology Code available for payment source | Source: Ambulatory Visit

## 2013-04-06 DIAGNOSIS — D500 Iron deficiency anemia secondary to blood loss (chronic): Secondary | ICD-10-CM | POA: Diagnosis not present

## 2013-04-11 DIAGNOSIS — E538 Deficiency of other specified B group vitamins: Secondary | ICD-10-CM | POA: Diagnosis not present

## 2013-04-18 ENCOUNTER — Ambulatory Visit (INDEPENDENT_AMBULATORY_CARE_PROVIDER_SITE_OTHER): Payer: Medicare Other | Admitting: Cardiovascular Disease

## 2013-04-18 ENCOUNTER — Encounter: Payer: Self-pay | Admitting: Cardiovascular Disease

## 2013-04-18 VITALS — BP 162/78 | HR 65 | Ht 62.0 in | Wt 129.8 lb

## 2013-04-18 DIAGNOSIS — E785 Hyperlipidemia, unspecified: Secondary | ICD-10-CM | POA: Diagnosis not present

## 2013-04-18 DIAGNOSIS — I6529 Occlusion and stenosis of unspecified carotid artery: Secondary | ICD-10-CM

## 2013-04-18 DIAGNOSIS — K219 Gastro-esophageal reflux disease without esophagitis: Secondary | ICD-10-CM | POA: Diagnosis not present

## 2013-04-18 DIAGNOSIS — M25473 Effusion, unspecified ankle: Secondary | ICD-10-CM

## 2013-04-18 DIAGNOSIS — I10 Essential (primary) hypertension: Secondary | ICD-10-CM

## 2013-04-18 DIAGNOSIS — D649 Anemia, unspecified: Secondary | ICD-10-CM

## 2013-04-18 DIAGNOSIS — I700 Atherosclerosis of aorta: Secondary | ICD-10-CM | POA: Diagnosis not present

## 2013-04-18 DIAGNOSIS — R609 Edema, unspecified: Secondary | ICD-10-CM

## 2013-04-18 DIAGNOSIS — IMO0001 Reserved for inherently not codable concepts without codable children: Secondary | ICD-10-CM

## 2013-04-18 MED ORDER — HYDROCHLOROTHIAZIDE 12.5 MG PO CAPS: 12.5000 mg | ORAL_CAPSULE | Freq: Every day | ORAL | Status: DC

## 2013-04-18 NOTE — Patient Instructions (Signed)
Your physician has recommended you make the following change in your medication: start new prescription given for hydrochlorathiazide 12.5 mg this has already been sent to the pharmacy.   Your physician recommends that you schedule a follow-up appointment in: 2-3 months.

## 2013-04-25 ENCOUNTER — Encounter: Payer: Self-pay | Admitting: Cardiovascular Disease

## 2013-04-25 DIAGNOSIS — M25473 Effusion, unspecified ankle: Secondary | ICD-10-CM | POA: Insufficient documentation

## 2013-04-25 DIAGNOSIS — IMO0001 Reserved for inherently not codable concepts without codable children: Secondary | ICD-10-CM

## 2013-04-25 HISTORY — DX: Reserved for inherently not codable concepts without codable children: IMO0001

## 2013-04-25 NOTE — Progress Notes (Signed)
Patient ID: [name], female   DOB: 1925/06/26, 80 y.o.   MRN: [id number]     HPI: Maria Hanna is a 80 y.o. female who presents for follow-up cardiology evaluation.  Maria Hanna has a history of hypertension, hyperlipidemia, as well as carotid disease. She is status post remote CVA. 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 Additional problems include anemia, aortic valve sclerosis, and grade 1 diastolic dysfunction.  I last saw her 4 months ago.  She does have recent development of ankle swelling right greater than left. She also has a right hand/arm tremor. She is unaware of any episodes of chest pain. She denies any paresthesias. In the past had seen a hematologist and had been put on Procrit for her anemia. She also has a history of GERD and hyperlipidemia.  Past Medical History  Diagnosis Date  . Hypertension   . Hyperlipidemia   . Chronic kidney disease   . Stroke     History reviewed. No pertinent past surgical history.  Allergies  Allergen Reactions  . Atorvastatin Other (See Comments)    Muscle pain    Current Outpatient Prescriptions  Medication Sig Dispense Refill  . aspirin 81 MG tablet Take 81 mg by mouth every other day.      . calcium carbonate (OS-CAL) 600 MG TABS tablet Take 600 mg by mouth 2 (two) times daily with a meal.      . clopidogrel (PLAVIX) 75 MG tablet Take 75 mg by mouth daily with breakfast.      . Cyanocobalamin (VITAMIN B-12 IJ) Inject as directed every 30 (thirty) days.      Marland Kitchen doxazosin (CARDURA) 2 MG tablet Take 2 mg by mouth daily.      . fenofibrate (TRICOR) 145 MG tablet TAKE 1 TABLET (145 MG TOTAL) BY MOUTH DAILY.  90 tablet  3  . lisinopril (PRINIVIL,ZESTRIL) 40 MG tablet Take 40 mg by mouth daily.      . Multiple Vitamins-Minerals (OCUVITE PRESERVISION PO) Take 1 tablet by mouth daily.      . pantoprazole (PROTONIX) 40 MG tablet Take 40 mg by mouth daily.      .  psyllium (METAMUCIL) 58.6 % packet Take 1 packet by mouth daily as needed.       . rosuvastatin (CRESTOR) 20 MG tablet Take 20 mg by mouth daily.      . hydrochlorothiazide (MICROZIDE) 12.5 MG capsule Take 1 capsule (12.5 mg total) by mouth daily.  90 capsule  3   No current facility-administered medications for this visit.    History   Social History  . Marital Status: Married    Spouse Name: N/A    Number of Children: N/A  . Years of Education: N/A   Occupational History  . Not on file.   Social History Main Topics  . Smoking status: Never Smoker   . Smokeless tobacco: Never Used  . Alcohol Use: Not on file  . Drug Use: No  . Sexual Activity: Not on file   Other Topics Concern  . Not on file   Social History Narrative  . No narrative on file   Additional social history is notable that she is married and has 2 children and 2 grandchildren. She does not use tobacco or alcohol. She does not routinely exercise.  History reviewed. No pertinent family history.  ROS is negative for fevers, chills or night sweats. She denies skin  rash. She denies change in vision She denies hearing issues. She denies weakness on one side of the body versus the other. There is no shortness of breath. She denies presyncope or syncope. There is no chest pressure pressure. At times she does note some mild constipation. She also does have remote GERD. She denies nausea vomiting or diarrhea. There is no blood in stool or urine. She denies claudication. She admits to ankle swelling right greater than left . She does have a tremor of her right hand and arm. She denies rash. She denies known diabetes. She denies cold or heat intolerance.  Other comprehensive 14 point system review is negative.  PE BP 162/78  Pulse 65  Ht 5' 2"  (1.575 m)  Wt 129 lb 12.8 oz (58.877 kg)  BMI 23.73 kg/m2  Repeat blood pressure by me 03/05/1968. General: Alert, oriented, no distress.  Skin: normal turgor, no rashes HEENT:  Normocephalic, atraumatic. Pupils round and reactive; sclera anicteric;no lid lag.  Nose without nasal septal hypertrophy Mouth/Parynx benign; Mallinpatti scale 2 Neck: No JVD, right carotid bruit with normal carotid upstroke Lungs: clear to ausculatation and percussion; no wheezing or rales Heart: RRR, s1 s2 normal 2/6 systolic murmur in the aortic region concordant with her aortic sclerosis Abdomen: soft, nontender; no hepatosplenomehaly, BS+; abdominal aorta nontender and not dilated by palpation. Back: No CVA tenderness Pulses 2+ Extremities: no clubbing cyanosis or edema, Homan's sign negative  Neurologic: grossly nonfocal Psychologic: normal affect and mood.  ECG (and apparently read by me) normal sinus rhythm at 65 beats per minute. Normal intervals. No ectopy.  Prior 12/23/2012 ECG: Sinus rhythm at 60 beats per minute. No ectopy.  LABS:  BMET    Component Value Date/Time   NA 142 01/12/2009 0545   K 4.0 01/12/2009 0545   CL 108 01/12/2009 0545   CO2 28 05/06/2007 0848   GLUCOSE 102* 01/12/2009 0545   BUN 32* 01/12/2009 0545   CREATININE 1.0 01/12/2009 0545   CALCIUM 9.1 05/06/2007 0848   GFRNONAA >60 05/06/2007 0848   GFRAA  Value: >60        The eGFR has been calculated using the MDRD equation. This calculation has not been validated in all clinical 05/06/2007 0848     Hepatic Function Panel  No results found for this basename: prot,  albumin,  ast,  alt,  alkphos,  bilitot,  bilidir,  ibili     CBC    Component Value Date/Time   WBC 7.8 01/12/2009 0535   RBC 3.63* 01/12/2009 0535   HGB 11.6* 01/12/2009 0545   HCT 34.0* 01/12/2009 0545   PLT 190 01/12/2009 0535   MCV 92.3 01/12/2009 0535   MCHC 34.0 01/12/2009 0535   RDW 13.5 01/12/2009 0535   LYMPHSABS 1.6 01/12/2009 0535   MONOABS 0.5 01/12/2009 0535   EOSABS 0.2 01/12/2009 0535   BASOSABS 0.0 01/12/2009 0535     BNP No results found for this basename: probnp    Lipid Panel  No results found for this basename:  chol,  trig,  hdl,  cholhdl,  vldl,  ldlcalc     RADIOLOGY: No results found.    ASSESSMENT AND PLAN: Maria Hanna is an 80 year old female has remote history of CVA and has known total occlusion of her left carotid with 50-60% stenosis of her right carotid. Chest history of hypertension hyperlipidemia as well as aortic valve sclerosis , and grade 1 diastolic dysfunction. Her blood pressure today is elevated. She does have mild ankle  swelling. I'm electing to add hydrochlorothiazide 12.5 mg to her medical regimen which should also improve blood pressure above and beyond her current treatment with lisinopril 40 mg daily and Cardura 2 mg. She is on aspirin and Plavix for her carotid disease and prior stroke. She takes Protonix for GERD. She is on fenofibrate 145 mg and Crestor 20 mg for hyperlipidemia. She will be scheduled for followup echo Doppler study to reassess her systolic diastolic function and aortic valve disease. I will see her in 3 months for cardiology reevaluation.   Troy Sine, MD, [facility]  04/25/2013 9:03 AM

## 2013-04-26 DIAGNOSIS — H353290 Exudative age-related macular degeneration, unspecified eye, stage unspecified: Secondary | ICD-10-CM | POA: Diagnosis not present

## 2013-04-26 DIAGNOSIS — Z961 Presence of intraocular lens: Secondary | ICD-10-CM | POA: Diagnosis not present

## 2013-04-29 DIAGNOSIS — D649 Anemia, unspecified: Secondary | ICD-10-CM | POA: Diagnosis not present

## 2013-05-02 ENCOUNTER — Ambulatory Visit (HOSPITAL_COMMUNITY)
Admission: RE | Admit: 2013-05-02 | Discharge: 2013-05-02 | Disposition: A | Discharge status: Home / Self Care | Payer: Medicare Other | Source: Ambulatory Visit | Attending: Cardiovascular Disease | Admitting: Cardiovascular Disease

## 2013-05-02 DIAGNOSIS — R011 Cardiac murmur, unspecified: Secondary | ICD-10-CM

## 2013-05-02 DIAGNOSIS — I517 Cardiomegaly: Secondary | ICD-10-CM | POA: Diagnosis not present

## 2013-05-02 NOTE — Progress Notes (Signed)
2D Echo Performed 05/02/2013    Shanequia Kendrick, RCS  

## 2013-05-03 DIAGNOSIS — Z09 Encounter for follow-up examination after completed treatment for conditions other than malignant neoplasm: Secondary | ICD-10-CM | POA: Diagnosis not present

## 2013-05-03 DIAGNOSIS — N189 Chronic kidney disease, unspecified: Secondary | ICD-10-CM | POA: Diagnosis not present

## 2013-05-03 DIAGNOSIS — D631 Anemia in chronic kidney disease: Secondary | ICD-10-CM | POA: Diagnosis not present

## 2013-05-04 DIAGNOSIS — I10 Essential (primary) hypertension: Secondary | ICD-10-CM | POA: Diagnosis not present

## 2013-05-04 DIAGNOSIS — I69959 Hemiplegia and hemiparesis following unspecified cerebrovascular disease affecting unspecified side: Secondary | ICD-10-CM | POA: Diagnosis not present

## 2013-05-04 DIAGNOSIS — E782 Mixed hyperlipidemia: Secondary | ICD-10-CM | POA: Diagnosis not present

## 2013-05-04 DIAGNOSIS — G20 Parkinson's disease: Secondary | ICD-10-CM | POA: Diagnosis not present

## 2013-05-11 DIAGNOSIS — E538 Deficiency of other specified B group vitamins: Secondary | ICD-10-CM | POA: Diagnosis not present

## 2013-05-11 DIAGNOSIS — E782 Mixed hyperlipidemia: Secondary | ICD-10-CM | POA: Diagnosis not present

## 2013-05-11 DIAGNOSIS — D631 Anemia in chronic kidney disease: Secondary | ICD-10-CM | POA: Diagnosis not present

## 2013-05-11 DIAGNOSIS — N183 Chronic kidney disease, stage 3 unspecified: Secondary | ICD-10-CM | POA: Diagnosis not present

## 2013-05-18 ENCOUNTER — Encounter: Payer: Self-pay | Admitting: *Deleted

## 2013-06-02 DIAGNOSIS — D649 Anemia, unspecified: Secondary | ICD-10-CM | POA: Diagnosis not present

## 2013-07-04 DIAGNOSIS — D631 Anemia in chronic kidney disease: Secondary | ICD-10-CM | POA: Diagnosis not present

## 2013-07-04 DIAGNOSIS — N189 Chronic kidney disease, unspecified: Secondary | ICD-10-CM | POA: Diagnosis not present

## 2013-07-20 ENCOUNTER — Encounter: Payer: Self-pay | Admitting: *Deleted

## 2013-07-23 ENCOUNTER — Telehealth: Payer: Self-pay | Admitting: Cardiovascular Disease

## 2013-07-23 NOTE — Telephone Encounter (Signed)
Closed encounter °

## 2013-07-25 ENCOUNTER — Ambulatory Visit: Payer: Medicare Other | Admitting: Cardiovascular Disease

## 2013-08-02 ENCOUNTER — Encounter: Payer: Self-pay | Admitting: Cardiovascular Disease

## 2013-08-02 ENCOUNTER — Ambulatory Visit (INDEPENDENT_AMBULATORY_CARE_PROVIDER_SITE_OTHER): Payer: Medicare Other | Admitting: Cardiovascular Disease

## 2013-08-02 VITALS — BP 142/62 | HR 68 | Ht 62.0 in | Wt 135.0 lb

## 2013-08-02 DIAGNOSIS — I6523 Occlusion and stenosis of bilateral carotid arteries: Secondary | ICD-10-CM

## 2013-08-02 DIAGNOSIS — I658 Occlusion and stenosis of other precerebral arteries: Secondary | ICD-10-CM

## 2013-08-02 DIAGNOSIS — I10 Essential (primary) hypertension: Secondary | ICD-10-CM

## 2013-08-02 DIAGNOSIS — I700 Atherosclerosis of aorta: Secondary | ICD-10-CM | POA: Diagnosis not present

## 2013-08-02 DIAGNOSIS — I6529 Occlusion and stenosis of unspecified carotid artery: Secondary | ICD-10-CM

## 2013-08-02 DIAGNOSIS — IMO0001 Reserved for inherently not codable concepts without codable children: Secondary | ICD-10-CM

## 2013-08-02 DIAGNOSIS — M25473 Effusion, unspecified ankle: Secondary | ICD-10-CM

## 2013-08-02 DIAGNOSIS — R609 Edema, unspecified: Secondary | ICD-10-CM | POA: Diagnosis not present

## 2013-08-02 DIAGNOSIS — E785 Hyperlipidemia, unspecified: Secondary | ICD-10-CM

## 2013-08-02 NOTE — Patient Instructions (Addendum)
Your physician has recommended you make the following change in your medication: stop the doxazosin. Resume fluid pill ( hydrochlorothiazide)  Your physician recommends that you schedule a follow-up appointment in: 6 months.

## 2013-08-02 NOTE — Progress Notes (Signed)
Patient ID: Maria Hanna, female   DOB: [birth year], 80 y.o.   MRN: [id number]     HPI: Maria Hanna is a 80 y.o. female who presents for 6 month follow-up cardiology evaluation.  Maria Hanna has a history of hypertension, hyperlipidemia, as well as carotid disease. She is status post remote CVA. 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 Additional problems include anemia, aortic valve sclerosis, grade 1 diastolic dysfunction and intermittent lower extremity edema.  When I saw her last she had development of ankle swelling right greater than left , and was hypertensive with a blood pressure of 132 systolic.  I recommended the addition of HCTZ 12.5 mg to her medical regimen.  She tells me she saw her primary care physician shortly thereafter and her blood pressure was low and was therefore told to stop taking this.  She still notes occasional leg swelling.  She denies palpitations She also has a right hand/arm tremor. She is unaware of any episodes of chest pain. She denies any paresthesias. In the past had seen a hematologist and had been put on Procrit for her anemia.  A recent hemoglobin and hematocrit was 10.7/33.5, and she is to follow up with the hematologist later this week.  She also has a history of GERD and hyperlipidemia.    Past Medical History  Diagnosis Date  . Hypertension   . Hyperlipidemia   . Chronic kidney disease   . Stroke   . Carotid disease, bilateral     Past Surgical History  Procedure Laterality Date  . Cataract extraction  7/97,10/97  . Thyroidectomy  1959  . US echocardiography  05/09/2010    mild ca+ of the AOV leaflets,AOV mildly sclerotic,aortic root sclerosis.  Marland Kitchen Nm myocar perf wall motion  12/11/2008    negative for ischemia. Low risk scan.    Allergies  Allergen Reactions  . Atorvastatin Other (See Comments)    Muscle pain    Current Outpatient Prescriptions  Medication Sig Dispense  Refill  . aspirin 81 MG tablet Take 81 mg by mouth every other day.      . calcium carbonate (OS-CAL) 600 MG TABS tablet Take 600 mg by mouth 2 (two) times daily with a meal.      . clopidogrel (PLAVIX) 75 MG tablet Take 75 mg by mouth daily with breakfast.      . Cyanocobalamin (VITAMIN B-12 IJ) Inject as directed every 30 (thirty) days.      . fenofibrate (TRICOR) 145 MG tablet TAKE 1 TABLET (145 MG TOTAL) BY MOUTH DAILY.  90 tablet  3  . hydrochlorothiazide (MICROZIDE) 12.5 MG capsule Take 1 capsule (12.5 mg total) by mouth daily.  90 capsule  3  . lisinopril (PRINIVIL,ZESTRIL) 40 MG tablet Take 40 mg by mouth daily.      . Multiple Vitamins-Minerals (OCUVITE PRESERVISION PO) Take 1 tablet by mouth daily.      . pantoprazole (PROTONIX) 40 MG tablet Take 40 mg by mouth daily.      . psyllium (METAMUCIL) 58.6 % packet Take 1 packet by mouth daily as needed.       . rosuvastatin (CRESTOR) 20 MG tablet Take 20 mg by mouth daily.       No current facility-administered medications for this visit.    History   Social History  . Marital Status: Married    Spouse Name: N/A    Number of Children: N/A  .  Years of Education: N/A   Occupational History  . Not on file.   Social History Main Topics  . Smoking status: Never Smoker   . Smokeless tobacco: Never Used  . Alcohol Use: No  . Drug Use: No  . Sexual Activity: Not on file   Other Topics Concern  . Not on file   Social History Narrative  . No narrative on file   Additional social history is notable that she is married and has 2 children and 2 grandchildren. She does not use tobacco or alcohol. She does not routinely exercise.  Family History  Problem Relation Age of Onset  . Cancer Father   . Cancer Brother   . Pneumonia Mother    ROS General: Negative; No fevers, chills, or night sweats;  HEENT: Negative; No changes in vision or hearing, sinus congestion, difficulty swallowing Pulmonary: Negative; No cough, wheezing,  shortness of breath, hemoptysis Cardiovascular: Negative; No chest pain, presyncope, syncope, palpatations Positive for ankle swelling GI: Positive for GERD No nausea, vomiting, diarrhea, or abdominal pain GU: Negative; No dysuria, hematuria, or difficulty voiding Musculoskeletal: Negative; no myalgias, joint pain, or weakness Hematologic/Oncology: Positive for anemia; no easy bruising, bleeding Endocrine: Negative; no heat/cold intolerance; no diabetes Neuro: Positive for carotid stenoses, as are the left occluded carotid and right carotid with 50-69% stenosis; no changes in balance, headaches Skin: Negative; No rashes or skin lesions Psychiatric: Negative; No behavioral problems, depression Sleep: Negative; No snoring, daytime sleepiness, hypersomnolence, bruxism, restless legs, hypnogognic hallucinations, no cataplexy Other comprehensive 14 point system review is negative.  PE BP 142/62  Pulse 68  Ht 5' 2"  (1.575 m)  Wt 135 lb (61.236 kg)  BMI 24.69 kg/m2  Repeat blood pressure by me 03/05/1968. General: Alert, oriented, no distress.  Skin: normal turgor, no rashes HEENT: Normocephalic, atraumatic. Pupils round and reactive; sclera anicteric;no lid lag.  Nose without nasal septal hypertrophy Mouth/Parynx benign; Mallinpatti scale 2 Neck: No JVD, right carotid bruit with normal carotid upstroke Lungs: clear to ausculatation and percussion; no wheezing or rales Heart: RRR, s1 s2 normal; 2/6 systolic murmur in the aortic region concordant with her aortic sclerosis Abdomen: soft, nontender; no hepatosplenomehaly, BS+; abdominal aorta nontender and not dilated by palpation. Back: No CVA tenderness Pulses 2+ Extremities: Trace to 1+ bilateral ankle edema no clubbing cyanosis , Homan's sign negative  Neurologic: Right arm tremor Psychologic: normal affect and mood.  ECG (independently read by me): Normal sinus rhythm at 68 beats per minute.  No significant ST changes.  QTc interval  395 msec.  Prior March 90,015 ECG (and apparently read by me) normal sinus rhythm at 65 beats per minute. Normal intervals. No ectopy.  Prior 12/23/2012 ECG: Sinus rhythm at 60 beats per minute. No ectopy.  LABS:  BMET    Component Value Date/Time   NA 142 01/12/2009 0545   K 4.0 01/12/2009 0545   CL 108 01/12/2009 0545   CO2 28 05/06/2007 0848   GLUCOSE 102* 01/12/2009 0545   BUN 32* 01/12/2009 0545   CREATININE 1.0 01/12/2009 0545   CALCIUM 9.1 05/06/2007 0848   GFRNONAA >60 05/06/2007 0848   GFRAA  Value: >60        The eGFR has been calculated using the MDRD equation. This calculation has not been validated in all clinical 05/06/2007 0848     Hepatic Function Panel  No results found for this basename: prot,  albumin,  ast,  alt,  alkphos,  bilitot,  bilidir,  ibili  CBC    Component Value Date/Time   WBC 7.8 01/12/2009 0535   RBC 3.63* 01/12/2009 0535   HGB 11.6* 01/12/2009 0545   HCT 34.0* 01/12/2009 0545   PLT 190 01/12/2009 0535   MCV 92.3 01/12/2009 0535   MCHC 34.0 01/12/2009 0535   RDW 13.5 01/12/2009 0535   LYMPHSABS 1.6 01/12/2009 0535   MONOABS 0.5 01/12/2009 0535   EOSABS 0.2 01/12/2009 0535   BASOSABS 0.0 01/12/2009 0535     BNP No results found for this basename: probnp    Lipid Panel  No results found for this basename: chol,  trig,  hdl,  cholhdl,  vldl,  ldlcalc     RADIOLOGY: No results found.    ASSESSMENT AND PLAN: Maria Hanna is an 80 year old female has remote history of CVA and has known total occlusion of her left carotid with 50-60% stenosis of her right carotid. She has a history of hypertension hyperlipidemia as well as aortic valve sclerosis , and grade 1 diastolic dysfunction.  Presently, she has been on lisinopril 40 mg in addition to digoxin Zosyn 2 mg and has not been taking her HCTZ 12.5 mg for blood pressure control.  When I last saw her blood pressure was 220 systolically and she did have leg edema for which the HCTZ was added.   Presently, I recommended she discontinue the Doxil, Zosyn and resume taking the HCTZ to 25 mg.  Hopefully, she will tolerate this well.  Her blood pressure standpoint and also derived benefit with reference to her intermittent ankle edema.  She is tolerating Crestor 20 mg for hyperlipidemia.  She is on baby aspirin plus Plavix 75 mg for peripheral vascular disease carotid abnormalities.  She her GERD is well controlled with Protonix.  I will see her in 6 months for reevaluation or sooner if problems arise.   Troy Sine, MD, [facility]  08/02/2013 3:22 PM

## 2013-08-04 DIAGNOSIS — N039 Chronic nephritic syndrome with unspecified morphologic changes: Secondary | ICD-10-CM | POA: Diagnosis not present

## 2013-08-04 DIAGNOSIS — Z09 Encounter for follow-up examination after completed treatment for conditions other than malignant neoplasm: Secondary | ICD-10-CM | POA: Diagnosis not present

## 2013-08-04 DIAGNOSIS — D631 Anemia in chronic kidney disease: Secondary | ICD-10-CM | POA: Diagnosis not present

## 2013-08-04 DIAGNOSIS — E538 Deficiency of other specified B group vitamins: Secondary | ICD-10-CM | POA: Diagnosis not present

## 2013-08-04 DIAGNOSIS — D509 Iron deficiency anemia, unspecified: Secondary | ICD-10-CM | POA: Diagnosis not present

## 2013-08-04 DIAGNOSIS — N189 Chronic kidney disease, unspecified: Secondary | ICD-10-CM | POA: Diagnosis not present

## 2013-08-10 DIAGNOSIS — D509 Iron deficiency anemia, unspecified: Secondary | ICD-10-CM | POA: Diagnosis not present

## 2013-10-10 DIAGNOSIS — D649 Anemia, unspecified: Secondary | ICD-10-CM | POA: Diagnosis not present

## 2013-10-12 DIAGNOSIS — N039 Chronic nephritic syndrome with unspecified morphologic changes: Secondary | ICD-10-CM | POA: Diagnosis not present

## 2013-10-12 DIAGNOSIS — D509 Iron deficiency anemia, unspecified: Secondary | ICD-10-CM | POA: Diagnosis not present

## 2013-10-12 DIAGNOSIS — D631 Anemia in chronic kidney disease: Secondary | ICD-10-CM | POA: Diagnosis not present

## 2013-10-12 DIAGNOSIS — N189 Chronic kidney disease, unspecified: Secondary | ICD-10-CM | POA: Diagnosis not present

## 2013-11-04 DIAGNOSIS — R55 Syncope and collapse: Secondary | ICD-10-CM | POA: Diagnosis not present

## 2013-11-04 DIAGNOSIS — N390 Urinary tract infection, site not specified: Secondary | ICD-10-CM | POA: Diagnosis not present

## 2013-11-04 DIAGNOSIS — M1990 Unspecified osteoarthritis, unspecified site: Secondary | ICD-10-CM | POA: Diagnosis not present

## 2013-11-11 DIAGNOSIS — Z23 Encounter for immunization: Secondary | ICD-10-CM | POA: Diagnosis not present

## 2013-11-11 DIAGNOSIS — R609 Edema, unspecified: Secondary | ICD-10-CM | POA: Diagnosis not present

## 2013-11-11 DIAGNOSIS — I10 Essential (primary) hypertension: Secondary | ICD-10-CM | POA: Diagnosis not present

## 2013-11-11 DIAGNOSIS — Z1389 Encounter for screening for other disorder: Secondary | ICD-10-CM | POA: Diagnosis not present

## 2013-11-11 DIAGNOSIS — Z9181 History of falling: Secondary | ICD-10-CM | POA: Diagnosis not present

## 2013-11-11 DIAGNOSIS — N183 Chronic kidney disease, stage 3 (moderate): Secondary | ICD-10-CM | POA: Diagnosis not present

## 2013-12-05 DIAGNOSIS — I10 Essential (primary) hypertension: Secondary | ICD-10-CM | POA: Diagnosis not present

## 2013-12-05 DIAGNOSIS — M179 Osteoarthritis of knee, unspecified: Secondary | ICD-10-CM | POA: Diagnosis not present

## 2013-12-05 DIAGNOSIS — R609 Edema, unspecified: Secondary | ICD-10-CM | POA: Diagnosis not present

## 2013-12-05 DIAGNOSIS — N183 Chronic kidney disease, stage 3 (moderate): Secondary | ICD-10-CM | POA: Diagnosis not present

## 2013-12-05 DIAGNOSIS — E782 Mixed hyperlipidemia: Secondary | ICD-10-CM | POA: Diagnosis not present

## 2014-01-30 ENCOUNTER — Other Ambulatory Visit: Payer: Self-pay

## 2014-01-30 MED ORDER — FENOFIBRATE 145 MG PO TABS: 145.0000 mg | ORAL_TABLET | Freq: Every day | ORAL | Status: DC

## 2014-01-30 NOTE — Telephone Encounter (Signed)
Rx was sent to pharmacy electronically. 

## 2014-02-13 DIAGNOSIS — N189 Chronic kidney disease, unspecified: Secondary | ICD-10-CM | POA: Diagnosis not present

## 2014-02-13 DIAGNOSIS — D631 Anemia in chronic kidney disease: Secondary | ICD-10-CM | POA: Diagnosis not present

## 2014-02-16 DIAGNOSIS — D631 Anemia in chronic kidney disease: Secondary | ICD-10-CM | POA: Diagnosis not present

## 2014-02-16 DIAGNOSIS — N189 Chronic kidney disease, unspecified: Secondary | ICD-10-CM | POA: Diagnosis not present

## 2014-03-14 ENCOUNTER — Telehealth (HOSPITAL_COMMUNITY): Payer: Self-pay | Admitting: *Deleted

## 2014-04-10 DIAGNOSIS — I69359 Hemiplegia and hemiparesis following cerebral infarction affecting unspecified side: Secondary | ICD-10-CM | POA: Diagnosis not present

## 2014-04-10 DIAGNOSIS — I10 Essential (primary) hypertension: Secondary | ICD-10-CM | POA: Diagnosis not present

## 2014-04-10 DIAGNOSIS — N189 Chronic kidney disease, unspecified: Secondary | ICD-10-CM | POA: Diagnosis not present

## 2014-04-10 DIAGNOSIS — I739 Peripheral vascular disease, unspecified: Secondary | ICD-10-CM | POA: Diagnosis not present

## 2014-04-10 DIAGNOSIS — M179 Osteoarthritis of knee, unspecified: Secondary | ICD-10-CM | POA: Diagnosis not present

## 2014-04-10 DIAGNOSIS — G20 Parkinson's disease: Secondary | ICD-10-CM | POA: Diagnosis not present

## 2014-04-10 DIAGNOSIS — R634 Abnormal weight loss: Secondary | ICD-10-CM | POA: Diagnosis not present

## 2014-04-10 DIAGNOSIS — N183 Chronic kidney disease, stage 3 (moderate): Secondary | ICD-10-CM | POA: Diagnosis not present

## 2014-04-10 DIAGNOSIS — E782 Mixed hyperlipidemia: Secondary | ICD-10-CM | POA: Diagnosis not present

## 2014-04-14 DIAGNOSIS — I739 Peripheral vascular disease, unspecified: Secondary | ICD-10-CM | POA: Diagnosis not present

## 2014-04-14 DIAGNOSIS — I6523 Occlusion and stenosis of bilateral carotid arteries: Secondary | ICD-10-CM | POA: Diagnosis not present

## 2014-05-24 ENCOUNTER — Other Ambulatory Visit: Payer: Self-pay | Admitting: *Deleted

## 2014-05-24 MED ORDER — HYDROCHLOROTHIAZIDE 12.5 MG PO CAPS: 12.5000 mg | ORAL_CAPSULE | Freq: Every day | ORAL | Status: DC

## 2014-06-20 ENCOUNTER — Other Ambulatory Visit: Payer: Self-pay

## 2014-06-20 MED ORDER — HYDROCHLOROTHIAZIDE 12.5 MG PO CAPS: 12.5000 mg | ORAL_CAPSULE | Freq: Every day | ORAL | Status: DC

## 2014-06-20 NOTE — Telephone Encounter (Signed)
Rx(s) sent to pharmacy electronically.  

## 2014-06-22 DIAGNOSIS — D631 Anemia in chronic kidney disease: Secondary | ICD-10-CM | POA: Diagnosis not present

## 2014-06-22 DIAGNOSIS — N189 Chronic kidney disease, unspecified: Secondary | ICD-10-CM | POA: Diagnosis not present

## 2014-07-16 ENCOUNTER — Other Ambulatory Visit: Payer: Self-pay | Admitting: Cardiovascular Disease

## 2014-07-17 NOTE — Telephone Encounter (Signed)
Rx has been sent to the pharmacy electronically. ° °

## 2014-07-28 ENCOUNTER — Other Ambulatory Visit: Payer: Self-pay

## 2014-07-28 MED ORDER — FENOFIBRATE 145 MG PO TABS: 145.0000 mg | ORAL_TABLET | Freq: Every day | ORAL | Status: DC

## 2014-08-12 ENCOUNTER — Other Ambulatory Visit: Payer: Self-pay | Admitting: Cardiovascular Disease

## 2014-08-15 NOTE — Telephone Encounter (Signed)
Last telephone note says pt doesn't want to drive to the office anymore, supposed to find care else where

## 2014-08-17 DIAGNOSIS — Z23 Encounter for immunization: Secondary | ICD-10-CM | POA: Diagnosis not present

## 2014-08-17 DIAGNOSIS — E538 Deficiency of other specified B group vitamins: Secondary | ICD-10-CM | POA: Diagnosis not present

## 2014-08-17 DIAGNOSIS — N183 Chronic kidney disease, stage 3 (moderate): Secondary | ICD-10-CM | POA: Diagnosis not present

## 2014-08-17 DIAGNOSIS — I739 Peripheral vascular disease, unspecified: Secondary | ICD-10-CM | POA: Diagnosis not present

## 2014-08-17 DIAGNOSIS — E782 Mixed hyperlipidemia: Secondary | ICD-10-CM | POA: Diagnosis not present

## 2014-08-17 DIAGNOSIS — N189 Chronic kidney disease, unspecified: Secondary | ICD-10-CM | POA: Diagnosis not present

## 2014-08-17 DIAGNOSIS — I10 Essential (primary) hypertension: Secondary | ICD-10-CM | POA: Diagnosis not present

## 2014-08-17 DIAGNOSIS — G20 Parkinson's disease: Secondary | ICD-10-CM | POA: Diagnosis not present

## 2014-08-17 DIAGNOSIS — M179 Osteoarthritis of knee, unspecified: Secondary | ICD-10-CM | POA: Diagnosis not present

## 2014-08-17 DIAGNOSIS — Z1389 Encounter for screening for other disorder: Secondary | ICD-10-CM | POA: Diagnosis not present

## 2014-08-17 DIAGNOSIS — I69359 Hemiplegia and hemiparesis following cerebral infarction affecting unspecified side: Secondary | ICD-10-CM | POA: Diagnosis not present

## 2014-08-22 DIAGNOSIS — N189 Chronic kidney disease, unspecified: Secondary | ICD-10-CM | POA: Diagnosis not present

## 2014-08-22 DIAGNOSIS — D631 Anemia in chronic kidney disease: Secondary | ICD-10-CM | POA: Diagnosis not present

## 2014-10-20 DIAGNOSIS — D631 Anemia in chronic kidney disease: Secondary | ICD-10-CM | POA: Diagnosis not present

## 2014-10-20 DIAGNOSIS — N189 Chronic kidney disease, unspecified: Secondary | ICD-10-CM | POA: Diagnosis not present

## 2014-10-23 DIAGNOSIS — D631 Anemia in chronic kidney disease: Secondary | ICD-10-CM | POA: Diagnosis not present

## 2014-10-23 DIAGNOSIS — N189 Chronic kidney disease, unspecified: Secondary | ICD-10-CM | POA: Diagnosis not present

## 2014-12-22 DIAGNOSIS — M179 Osteoarthritis of knee, unspecified: Secondary | ICD-10-CM | POA: Diagnosis not present

## 2014-12-22 DIAGNOSIS — Z9181 History of falling: Secondary | ICD-10-CM | POA: Diagnosis not present

## 2014-12-22 DIAGNOSIS — Z1389 Encounter for screening for other disorder: Secondary | ICD-10-CM | POA: Diagnosis not present

## 2014-12-22 DIAGNOSIS — E782 Mixed hyperlipidemia: Secondary | ICD-10-CM | POA: Diagnosis not present

## 2014-12-22 DIAGNOSIS — I10 Essential (primary) hypertension: Secondary | ICD-10-CM | POA: Diagnosis not present

## 2014-12-22 DIAGNOSIS — G20 Parkinson's disease: Secondary | ICD-10-CM | POA: Diagnosis not present

## 2014-12-22 DIAGNOSIS — Z23 Encounter for immunization: Secondary | ICD-10-CM | POA: Diagnosis not present

## 2014-12-22 DIAGNOSIS — E538 Deficiency of other specified B group vitamins: Secondary | ICD-10-CM | POA: Diagnosis not present

## 2014-12-22 DIAGNOSIS — N189 Chronic kidney disease, unspecified: Secondary | ICD-10-CM | POA: Diagnosis not present

## 2014-12-22 DIAGNOSIS — I739 Peripheral vascular disease, unspecified: Secondary | ICD-10-CM | POA: Diagnosis not present

## 2014-12-22 DIAGNOSIS — D631 Anemia in chronic kidney disease: Secondary | ICD-10-CM | POA: Diagnosis not present

## 2014-12-22 DIAGNOSIS — I69359 Hemiplegia and hemiparesis following cerebral infarction affecting unspecified side: Secondary | ICD-10-CM | POA: Diagnosis not present

## 2014-12-22 DIAGNOSIS — K2970 Gastritis, unspecified, without bleeding: Secondary | ICD-10-CM | POA: Diagnosis not present

## 2014-12-22 DIAGNOSIS — N183 Chronic kidney disease, stage 3 (moderate): Secondary | ICD-10-CM | POA: Diagnosis not present

## 2015-02-27 DIAGNOSIS — H353132 Nonexudative age-related macular degeneration, bilateral, intermediate dry stage: Secondary | ICD-10-CM | POA: Diagnosis not present

## 2015-02-27 DIAGNOSIS — H26493 Other secondary cataract, bilateral: Secondary | ICD-10-CM | POA: Diagnosis not present

## 2015-04-25 DIAGNOSIS — K2970 Gastritis, unspecified, without bleeding: Secondary | ICD-10-CM | POA: Diagnosis not present

## 2015-04-25 DIAGNOSIS — E782 Mixed hyperlipidemia: Secondary | ICD-10-CM | POA: Diagnosis not present

## 2015-04-25 DIAGNOSIS — Z6820 Body mass index (BMI) 20.0-20.9, adult: Secondary | ICD-10-CM | POA: Diagnosis not present

## 2015-04-25 DIAGNOSIS — N189 Chronic kidney disease, unspecified: Secondary | ICD-10-CM | POA: Diagnosis not present

## 2015-04-25 DIAGNOSIS — I69359 Hemiplegia and hemiparesis following cerebral infarction affecting unspecified side: Secondary | ICD-10-CM | POA: Diagnosis not present

## 2015-04-25 DIAGNOSIS — M179 Osteoarthritis of knee, unspecified: Secondary | ICD-10-CM | POA: Diagnosis not present

## 2015-04-25 DIAGNOSIS — I739 Peripheral vascular disease, unspecified: Secondary | ICD-10-CM | POA: Diagnosis not present

## 2015-04-25 DIAGNOSIS — G20 Parkinson's disease: Secondary | ICD-10-CM | POA: Diagnosis not present

## 2015-04-25 DIAGNOSIS — I10 Essential (primary) hypertension: Secondary | ICD-10-CM | POA: Diagnosis not present

## 2015-06-12 DIAGNOSIS — N189 Chronic kidney disease, unspecified: Secondary | ICD-10-CM | POA: Diagnosis not present

## 2015-06-12 DIAGNOSIS — D631 Anemia in chronic kidney disease: Secondary | ICD-10-CM | POA: Diagnosis not present

## 2015-07-24 DIAGNOSIS — D631 Anemia in chronic kidney disease: Secondary | ICD-10-CM | POA: Diagnosis not present

## 2015-07-24 DIAGNOSIS — N189 Chronic kidney disease, unspecified: Secondary | ICD-10-CM | POA: Diagnosis not present

## 2015-08-27 DIAGNOSIS — I10 Essential (primary) hypertension: Secondary | ICD-10-CM | POA: Diagnosis not present

## 2015-08-27 DIAGNOSIS — M1991 Primary osteoarthritis, unspecified site: Secondary | ICD-10-CM | POA: Diagnosis not present

## 2015-08-27 DIAGNOSIS — I739 Peripheral vascular disease, unspecified: Secondary | ICD-10-CM | POA: Diagnosis not present

## 2015-08-27 DIAGNOSIS — N183 Chronic kidney disease, stage 3 (moderate): Secondary | ICD-10-CM | POA: Diagnosis not present

## 2015-08-27 DIAGNOSIS — N189 Chronic kidney disease, unspecified: Secondary | ICD-10-CM | POA: Diagnosis not present

## 2015-08-27 DIAGNOSIS — I69359 Hemiplegia and hemiparesis following cerebral infarction affecting unspecified side: Secondary | ICD-10-CM | POA: Diagnosis not present

## 2015-08-27 DIAGNOSIS — E782 Mixed hyperlipidemia: Secondary | ICD-10-CM | POA: Diagnosis not present

## 2015-09-04 DIAGNOSIS — D631 Anemia in chronic kidney disease: Secondary | ICD-10-CM | POA: Diagnosis not present

## 2015-09-04 DIAGNOSIS — N189 Chronic kidney disease, unspecified: Secondary | ICD-10-CM | POA: Diagnosis not present

## 2015-09-26 DIAGNOSIS — I6523 Occlusion and stenosis of bilateral carotid arteries: Secondary | ICD-10-CM | POA: Diagnosis not present

## 2015-09-26 DIAGNOSIS — I739 Peripheral vascular disease, unspecified: Secondary | ICD-10-CM | POA: Diagnosis not present

## 2015-12-06 DIAGNOSIS — D631 Anemia in chronic kidney disease: Secondary | ICD-10-CM | POA: Diagnosis not present

## 2015-12-06 DIAGNOSIS — N189 Chronic kidney disease, unspecified: Secondary | ICD-10-CM | POA: Diagnosis not present

## 2016-01-07 DIAGNOSIS — E538 Deficiency of other specified B group vitamins: Secondary | ICD-10-CM | POA: Diagnosis not present

## 2016-01-07 DIAGNOSIS — I69359 Hemiplegia and hemiparesis following cerebral infarction affecting unspecified side: Secondary | ICD-10-CM | POA: Diagnosis not present

## 2016-01-07 DIAGNOSIS — I10 Essential (primary) hypertension: Secondary | ICD-10-CM | POA: Diagnosis not present

## 2016-01-07 DIAGNOSIS — Z1389 Encounter for screening for other disorder: Secondary | ICD-10-CM | POA: Diagnosis not present

## 2016-01-07 DIAGNOSIS — E782 Mixed hyperlipidemia: Secondary | ICD-10-CM | POA: Diagnosis not present

## 2016-01-07 DIAGNOSIS — Z23 Encounter for immunization: Secondary | ICD-10-CM | POA: Diagnosis not present

## 2016-01-07 DIAGNOSIS — I739 Peripheral vascular disease, unspecified: Secondary | ICD-10-CM | POA: Diagnosis not present

## 2016-01-07 DIAGNOSIS — Z6821 Body mass index (BMI) 21.0-21.9, adult: Secondary | ICD-10-CM | POA: Diagnosis not present

## 2016-01-07 DIAGNOSIS — Z9181 History of falling: Secondary | ICD-10-CM | POA: Diagnosis not present

## 2016-01-07 DIAGNOSIS — N189 Chronic kidney disease, unspecified: Secondary | ICD-10-CM | POA: Diagnosis not present

## 2016-01-07 DIAGNOSIS — G20 Parkinson's disease: Secondary | ICD-10-CM | POA: Diagnosis not present

## 2016-01-07 DIAGNOSIS — H6120 Impacted cerumen, unspecified ear: Secondary | ICD-10-CM | POA: Diagnosis not present

## 2016-04-07 DIAGNOSIS — E611 Iron deficiency: Secondary | ICD-10-CM | POA: Diagnosis not present

## 2016-04-07 DIAGNOSIS — N189 Chronic kidney disease, unspecified: Secondary | ICD-10-CM | POA: Diagnosis not present

## 2016-04-07 DIAGNOSIS — D631 Anemia in chronic kidney disease: Secondary | ICD-10-CM | POA: Diagnosis not present

## 2016-05-06 DIAGNOSIS — Z6820 Body mass index (BMI) 20.0-20.9, adult: Secondary | ICD-10-CM | POA: Diagnosis not present

## 2016-05-06 DIAGNOSIS — N189 Chronic kidney disease, unspecified: Secondary | ICD-10-CM | POA: Diagnosis not present

## 2016-05-06 DIAGNOSIS — I10 Essential (primary) hypertension: Secondary | ICD-10-CM | POA: Diagnosis not present

## 2016-05-06 DIAGNOSIS — I69359 Hemiplegia and hemiparesis following cerebral infarction affecting unspecified side: Secondary | ICD-10-CM | POA: Diagnosis not present

## 2016-05-06 DIAGNOSIS — E782 Mixed hyperlipidemia: Secondary | ICD-10-CM | POA: Diagnosis not present

## 2016-05-06 DIAGNOSIS — G20 Parkinson's disease: Secondary | ICD-10-CM | POA: Diagnosis not present

## 2016-05-06 DIAGNOSIS — Z79899 Other long term (current) drug therapy: Secondary | ICD-10-CM | POA: Diagnosis not present

## 2016-05-06 DIAGNOSIS — N183 Chronic kidney disease, stage 3 (moderate): Secondary | ICD-10-CM | POA: Diagnosis not present

## 2016-05-06 DIAGNOSIS — M170 Bilateral primary osteoarthritis of knee: Secondary | ICD-10-CM | POA: Diagnosis not present

## 2016-05-06 DIAGNOSIS — I739 Peripheral vascular disease, unspecified: Secondary | ICD-10-CM | POA: Diagnosis not present

## 2016-07-03 DIAGNOSIS — D631 Anemia in chronic kidney disease: Secondary | ICD-10-CM | POA: Diagnosis not present

## 2016-07-03 DIAGNOSIS — N189 Chronic kidney disease, unspecified: Secondary | ICD-10-CM | POA: Diagnosis not present

## 2016-07-28 DIAGNOSIS — Z136 Encounter for screening for cardiovascular disorders: Secondary | ICD-10-CM | POA: Diagnosis not present

## 2016-07-28 DIAGNOSIS — E785 Hyperlipidemia, unspecified: Secondary | ICD-10-CM | POA: Diagnosis not present

## 2016-07-28 DIAGNOSIS — Z0000 Encounter for general adult medical examination without abnormal findings: Secondary | ICD-10-CM | POA: Diagnosis not present

## 2016-08-14 DIAGNOSIS — D631 Anemia in chronic kidney disease: Secondary | ICD-10-CM | POA: Diagnosis not present

## 2016-08-14 DIAGNOSIS — N189 Chronic kidney disease, unspecified: Secondary | ICD-10-CM | POA: Diagnosis not present

## 2016-09-24 DIAGNOSIS — I10 Essential (primary) hypertension: Secondary | ICD-10-CM | POA: Diagnosis not present

## 2016-09-24 DIAGNOSIS — G20 Parkinson's disease: Secondary | ICD-10-CM | POA: Diagnosis not present

## 2016-09-24 DIAGNOSIS — N183 Chronic kidney disease, stage 3 (moderate): Secondary | ICD-10-CM | POA: Diagnosis not present

## 2016-09-24 DIAGNOSIS — E782 Mixed hyperlipidemia: Secondary | ICD-10-CM | POA: Diagnosis not present

## 2016-09-24 DIAGNOSIS — I739 Peripheral vascular disease, unspecified: Secondary | ICD-10-CM | POA: Diagnosis not present

## 2016-09-24 DIAGNOSIS — Z6821 Body mass index (BMI) 21.0-21.9, adult: Secondary | ICD-10-CM | POA: Diagnosis not present

## 2016-09-24 DIAGNOSIS — M1991 Primary osteoarthritis, unspecified site: Secondary | ICD-10-CM | POA: Diagnosis not present

## 2016-09-24 DIAGNOSIS — I69359 Hemiplegia and hemiparesis following cerebral infarction affecting unspecified side: Secondary | ICD-10-CM | POA: Diagnosis not present

## 2016-09-24 DIAGNOSIS — K2970 Gastritis, unspecified, without bleeding: Secondary | ICD-10-CM | POA: Diagnosis not present

## 2016-09-25 DIAGNOSIS — N189 Chronic kidney disease, unspecified: Secondary | ICD-10-CM | POA: Diagnosis not present

## 2016-09-25 DIAGNOSIS — D631 Anemia in chronic kidney disease: Secondary | ICD-10-CM | POA: Diagnosis not present

## 2016-09-30 DIAGNOSIS — I739 Peripheral vascular disease, unspecified: Secondary | ICD-10-CM | POA: Diagnosis not present

## 2016-09-30 DIAGNOSIS — I6523 Occlusion and stenosis of bilateral carotid arteries: Secondary | ICD-10-CM | POA: Diagnosis not present

## 2016-10-16 DIAGNOSIS — D631 Anemia in chronic kidney disease: Secondary | ICD-10-CM | POA: Diagnosis not present

## 2016-10-16 DIAGNOSIS — N189 Chronic kidney disease, unspecified: Secondary | ICD-10-CM | POA: Diagnosis not present

## 2016-10-22 DIAGNOSIS — D631 Anemia in chronic kidney disease: Secondary | ICD-10-CM | POA: Diagnosis not present

## 2016-10-22 DIAGNOSIS — N189 Chronic kidney disease, unspecified: Secondary | ICD-10-CM | POA: Diagnosis not present

## 2016-11-06 DIAGNOSIS — D631 Anemia in chronic kidney disease: Secondary | ICD-10-CM | POA: Diagnosis not present

## 2016-11-06 DIAGNOSIS — N189 Chronic kidney disease, unspecified: Secondary | ICD-10-CM | POA: Diagnosis not present

## 2016-12-18 DIAGNOSIS — N189 Chronic kidney disease, unspecified: Secondary | ICD-10-CM | POA: Diagnosis not present

## 2016-12-18 DIAGNOSIS — D631 Anemia in chronic kidney disease: Secondary | ICD-10-CM | POA: Diagnosis not present

## 2017-01-26 DIAGNOSIS — Z1331 Encounter for screening for depression: Secondary | ICD-10-CM | POA: Diagnosis not present

## 2017-01-26 DIAGNOSIS — Z6821 Body mass index (BMI) 21.0-21.9, adult: Secondary | ICD-10-CM | POA: Diagnosis not present

## 2017-01-26 DIAGNOSIS — Z79899 Other long term (current) drug therapy: Secondary | ICD-10-CM | POA: Diagnosis not present

## 2017-01-26 DIAGNOSIS — Z9181 History of falling: Secondary | ICD-10-CM | POA: Diagnosis not present

## 2017-01-26 DIAGNOSIS — M179 Osteoarthritis of knee, unspecified: Secondary | ICD-10-CM | POA: Diagnosis not present

## 2017-01-26 DIAGNOSIS — I69359 Hemiplegia and hemiparesis following cerebral infarction affecting unspecified side: Secondary | ICD-10-CM | POA: Diagnosis not present

## 2017-01-26 DIAGNOSIS — Z23 Encounter for immunization: Secondary | ICD-10-CM | POA: Diagnosis not present

## 2017-01-26 DIAGNOSIS — K2970 Gastritis, unspecified, without bleeding: Secondary | ICD-10-CM | POA: Diagnosis not present

## 2017-01-26 DIAGNOSIS — G20 Parkinson's disease: Secondary | ICD-10-CM | POA: Diagnosis not present

## 2017-01-29 DIAGNOSIS — D631 Anemia in chronic kidney disease: Secondary | ICD-10-CM | POA: Diagnosis not present

## 2017-01-29 DIAGNOSIS — N189 Chronic kidney disease, unspecified: Secondary | ICD-10-CM | POA: Diagnosis not present

## 2017-03-12 DIAGNOSIS — N189 Chronic kidney disease, unspecified: Secondary | ICD-10-CM | POA: Diagnosis not present

## 2017-03-12 DIAGNOSIS — D631 Anemia in chronic kidney disease: Secondary | ICD-10-CM | POA: Diagnosis not present

## 2017-04-27 DIAGNOSIS — Z6821 Body mass index (BMI) 21.0-21.9, adult: Secondary | ICD-10-CM | POA: Diagnosis not present

## 2017-04-27 DIAGNOSIS — I69359 Hemiplegia and hemiparesis following cerebral infarction affecting unspecified side: Secondary | ICD-10-CM | POA: Diagnosis not present

## 2017-04-27 DIAGNOSIS — N183 Chronic kidney disease, stage 3 (moderate): Secondary | ICD-10-CM | POA: Diagnosis not present

## 2017-04-27 DIAGNOSIS — I10 Essential (primary) hypertension: Secondary | ICD-10-CM | POA: Diagnosis not present

## 2017-04-27 DIAGNOSIS — M179 Osteoarthritis of knee, unspecified: Secondary | ICD-10-CM | POA: Diagnosis not present

## 2017-04-27 DIAGNOSIS — I739 Peripheral vascular disease, unspecified: Secondary | ICD-10-CM | POA: Diagnosis not present

## 2017-04-27 DIAGNOSIS — G20 Parkinson's disease: Secondary | ICD-10-CM | POA: Diagnosis not present

## 2017-04-27 DIAGNOSIS — E782 Mixed hyperlipidemia: Secondary | ICD-10-CM | POA: Diagnosis not present

## 2017-05-08 DIAGNOSIS — N189 Chronic kidney disease, unspecified: Secondary | ICD-10-CM | POA: Diagnosis not present

## 2017-05-08 DIAGNOSIS — D631 Anemia in chronic kidney disease: Secondary | ICD-10-CM | POA: Diagnosis not present

## 2017-07-10 DIAGNOSIS — D631 Anemia in chronic kidney disease: Secondary | ICD-10-CM | POA: Diagnosis not present

## 2017-07-10 DIAGNOSIS — N189 Chronic kidney disease, unspecified: Secondary | ICD-10-CM | POA: Diagnosis not present

## 2017-07-27 DIAGNOSIS — I739 Peripheral vascular disease, unspecified: Secondary | ICD-10-CM | POA: Diagnosis not present

## 2017-07-27 DIAGNOSIS — M1711 Unilateral primary osteoarthritis, right knee: Secondary | ICD-10-CM | POA: Diagnosis not present

## 2017-07-27 DIAGNOSIS — Z79899 Other long term (current) drug therapy: Secondary | ICD-10-CM | POA: Diagnosis not present

## 2017-07-27 DIAGNOSIS — I69359 Hemiplegia and hemiparesis following cerebral infarction affecting unspecified side: Secondary | ICD-10-CM | POA: Diagnosis not present

## 2017-07-27 DIAGNOSIS — I10 Essential (primary) hypertension: Secondary | ICD-10-CM | POA: Diagnosis not present

## 2017-09-15 DIAGNOSIS — D631 Anemia in chronic kidney disease: Secondary | ICD-10-CM | POA: Diagnosis not present

## 2017-09-15 DIAGNOSIS — N189 Chronic kidney disease, unspecified: Secondary | ICD-10-CM | POA: Diagnosis not present

## 2017-11-04 DIAGNOSIS — Z23 Encounter for immunization: Secondary | ICD-10-CM | POA: Diagnosis not present

## 2017-11-04 DIAGNOSIS — I10 Essential (primary) hypertension: Secondary | ICD-10-CM | POA: Diagnosis not present

## 2017-11-04 DIAGNOSIS — I739 Peripheral vascular disease, unspecified: Secondary | ICD-10-CM | POA: Diagnosis not present

## 2017-11-04 DIAGNOSIS — E782 Mixed hyperlipidemia: Secondary | ICD-10-CM | POA: Diagnosis not present

## 2017-11-04 DIAGNOSIS — Z1339 Encounter for screening examination for other mental health and behavioral disorders: Secondary | ICD-10-CM | POA: Diagnosis not present

## 2017-11-04 DIAGNOSIS — N183 Chronic kidney disease, stage 3 (moderate): Secondary | ICD-10-CM | POA: Diagnosis not present

## 2017-11-04 DIAGNOSIS — M1711 Unilateral primary osteoarthritis, right knee: Secondary | ICD-10-CM | POA: Diagnosis not present

## 2017-11-09 DIAGNOSIS — N189 Chronic kidney disease, unspecified: Secondary | ICD-10-CM | POA: Diagnosis not present

## 2017-11-09 DIAGNOSIS — D631 Anemia in chronic kidney disease: Secondary | ICD-10-CM | POA: Diagnosis not present

## 2017-12-07 DIAGNOSIS — E785 Hyperlipidemia, unspecified: Secondary | ICD-10-CM | POA: Diagnosis not present

## 2017-12-07 DIAGNOSIS — Z139 Encounter for screening, unspecified: Secondary | ICD-10-CM | POA: Diagnosis not present

## 2017-12-07 DIAGNOSIS — Z136 Encounter for screening for cardiovascular disorders: Secondary | ICD-10-CM | POA: Diagnosis not present

## 2017-12-07 DIAGNOSIS — Z0000 Encounter for general adult medical examination without abnormal findings: Secondary | ICD-10-CM | POA: Diagnosis not present

## 2017-12-28 DIAGNOSIS — I739 Peripheral vascular disease, unspecified: Secondary | ICD-10-CM | POA: Diagnosis not present

## 2017-12-28 DIAGNOSIS — I6521 Occlusion and stenosis of right carotid artery: Secondary | ICD-10-CM | POA: Diagnosis not present

## 2017-12-30 ENCOUNTER — Other Ambulatory Visit: Payer: Self-pay

## 2018-01-12 DIAGNOSIS — D631 Anemia in chronic kidney disease: Secondary | ICD-10-CM | POA: Diagnosis not present

## 2018-01-12 DIAGNOSIS — N189 Chronic kidney disease, unspecified: Secondary | ICD-10-CM | POA: Diagnosis not present

## 2018-02-18 DIAGNOSIS — Z79899 Other long term (current) drug therapy: Secondary | ICD-10-CM | POA: Diagnosis not present

## 2018-02-18 DIAGNOSIS — M1711 Unilateral primary osteoarthritis, right knee: Secondary | ICD-10-CM | POA: Diagnosis not present

## 2018-02-18 DIAGNOSIS — I69359 Hemiplegia and hemiparesis following cerebral infarction affecting unspecified side: Secondary | ICD-10-CM | POA: Diagnosis not present

## 2018-02-18 DIAGNOSIS — I739 Peripheral vascular disease, unspecified: Secondary | ICD-10-CM | POA: Diagnosis not present

## 2018-02-18 DIAGNOSIS — I10 Essential (primary) hypertension: Secondary | ICD-10-CM | POA: Diagnosis not present

## 2018-03-11 DIAGNOSIS — N189 Chronic kidney disease, unspecified: Secondary | ICD-10-CM | POA: Diagnosis not present

## 2018-03-11 DIAGNOSIS — D631 Anemia in chronic kidney disease: Secondary | ICD-10-CM | POA: Diagnosis not present

## 2018-05-20 DIAGNOSIS — M1711 Unilateral primary osteoarthritis, right knee: Secondary | ICD-10-CM | POA: Diagnosis not present

## 2018-05-20 DIAGNOSIS — I739 Peripheral vascular disease, unspecified: Secondary | ICD-10-CM | POA: Diagnosis not present

## 2018-05-20 DIAGNOSIS — I10 Essential (primary) hypertension: Secondary | ICD-10-CM | POA: Diagnosis not present

## 2018-05-20 DIAGNOSIS — I69359 Hemiplegia and hemiparesis following cerebral infarction affecting unspecified side: Secondary | ICD-10-CM | POA: Diagnosis not present

## 2018-07-12 DIAGNOSIS — D631 Anemia in chronic kidney disease: Secondary | ICD-10-CM

## 2018-07-12 DIAGNOSIS — N189 Chronic kidney disease, unspecified: Secondary | ICD-10-CM

## 2018-07-20 DIAGNOSIS — D509 Iron deficiency anemia, unspecified: Secondary | ICD-10-CM | POA: Diagnosis not present

## 2018-07-20 DIAGNOSIS — D631 Anemia in chronic kidney disease: Secondary | ICD-10-CM | POA: Diagnosis not present

## 2018-07-20 DIAGNOSIS — N189 Chronic kidney disease, unspecified: Secondary | ICD-10-CM | POA: Diagnosis not present

## 2018-07-22 DIAGNOSIS — Z9181 History of falling: Secondary | ICD-10-CM | POA: Diagnosis not present

## 2018-07-22 DIAGNOSIS — E782 Mixed hyperlipidemia: Secondary | ICD-10-CM | POA: Diagnosis not present

## 2018-07-22 DIAGNOSIS — Z1331 Encounter for screening for depression: Secondary | ICD-10-CM | POA: Diagnosis not present

## 2018-07-22 DIAGNOSIS — I739 Peripheral vascular disease, unspecified: Secondary | ICD-10-CM | POA: Diagnosis not present

## 2018-07-22 DIAGNOSIS — M1711 Unilateral primary osteoarthritis, right knee: Secondary | ICD-10-CM | POA: Diagnosis not present

## 2018-07-22 DIAGNOSIS — I10 Essential (primary) hypertension: Secondary | ICD-10-CM | POA: Diagnosis not present

## 2018-07-27 DIAGNOSIS — N189 Chronic kidney disease, unspecified: Secondary | ICD-10-CM | POA: Diagnosis not present

## 2018-07-27 DIAGNOSIS — D509 Iron deficiency anemia, unspecified: Secondary | ICD-10-CM | POA: Diagnosis not present

## 2018-07-27 DIAGNOSIS — D631 Anemia in chronic kidney disease: Secondary | ICD-10-CM | POA: Diagnosis not present

## 2018-10-12 DIAGNOSIS — D649 Anemia, unspecified: Secondary | ICD-10-CM

## 2018-10-12 DIAGNOSIS — N189 Chronic kidney disease, unspecified: Secondary | ICD-10-CM | POA: Diagnosis not present

## 2018-10-12 DIAGNOSIS — D631 Anemia in chronic kidney disease: Secondary | ICD-10-CM

## 2018-10-13 DIAGNOSIS — M79605 Pain in left leg: Secondary | ICD-10-CM | POA: Diagnosis not present

## 2018-10-13 DIAGNOSIS — R600 Localized edema: Secondary | ICD-10-CM | POA: Diagnosis not present

## 2018-10-29 DIAGNOSIS — N189 Chronic kidney disease, unspecified: Secondary | ICD-10-CM | POA: Diagnosis not present

## 2018-10-29 DIAGNOSIS — D631 Anemia in chronic kidney disease: Secondary | ICD-10-CM | POA: Diagnosis not present

## 2018-11-26 DIAGNOSIS — N189 Chronic kidney disease, unspecified: Secondary | ICD-10-CM | POA: Diagnosis not present

## 2018-11-26 DIAGNOSIS — D631 Anemia in chronic kidney disease: Secondary | ICD-10-CM | POA: Diagnosis not present

## 2018-12-13 DIAGNOSIS — E785 Hyperlipidemia, unspecified: Secondary | ICD-10-CM | POA: Diagnosis not present

## 2018-12-13 DIAGNOSIS — Z139 Encounter for screening, unspecified: Secondary | ICD-10-CM | POA: Diagnosis not present

## 2018-12-13 DIAGNOSIS — Z0000 Encounter for general adult medical examination without abnormal findings: Secondary | ICD-10-CM | POA: Diagnosis not present

## 2018-12-13 DIAGNOSIS — Z9181 History of falling: Secondary | ICD-10-CM | POA: Diagnosis not present

## 2018-12-13 DIAGNOSIS — Z1331 Encounter for screening for depression: Secondary | ICD-10-CM | POA: Diagnosis not present

## 2018-12-30 DIAGNOSIS — N189 Chronic kidney disease, unspecified: Secondary | ICD-10-CM | POA: Diagnosis not present

## 2018-12-30 DIAGNOSIS — D631 Anemia in chronic kidney disease: Secondary | ICD-10-CM | POA: Diagnosis not present

## 2019-02-22 DIAGNOSIS — N189 Chronic kidney disease, unspecified: Secondary | ICD-10-CM | POA: Diagnosis not present

## 2019-02-22 DIAGNOSIS — D509 Iron deficiency anemia, unspecified: Secondary | ICD-10-CM | POA: Diagnosis not present

## 2019-02-22 DIAGNOSIS — D631 Anemia in chronic kidney disease: Secondary | ICD-10-CM | POA: Diagnosis not present

## 2019-02-25 DIAGNOSIS — D509 Iron deficiency anemia, unspecified: Secondary | ICD-10-CM | POA: Diagnosis not present

## 2019-03-04 DIAGNOSIS — D509 Iron deficiency anemia, unspecified: Secondary | ICD-10-CM | POA: Diagnosis not present

## 2019-05-02 DIAGNOSIS — D631 Anemia in chronic kidney disease: Secondary | ICD-10-CM | POA: Diagnosis not present

## 2019-05-02 DIAGNOSIS — D509 Iron deficiency anemia, unspecified: Secondary | ICD-10-CM | POA: Diagnosis not present

## 2019-05-02 DIAGNOSIS — N189 Chronic kidney disease, unspecified: Secondary | ICD-10-CM | POA: Diagnosis not present

## 2019-05-09 DIAGNOSIS — N189 Chronic kidney disease, unspecified: Secondary | ICD-10-CM | POA: Diagnosis not present

## 2019-05-09 DIAGNOSIS — D631 Anemia in chronic kidney disease: Secondary | ICD-10-CM | POA: Diagnosis not present

## 2019-06-02 DIAGNOSIS — D631 Anemia in chronic kidney disease: Secondary | ICD-10-CM | POA: Diagnosis not present

## 2019-06-02 DIAGNOSIS — N189 Chronic kidney disease, unspecified: Secondary | ICD-10-CM | POA: Diagnosis not present

## 2019-06-22 DIAGNOSIS — Z6820 Body mass index (BMI) 20.0-20.9, adult: Secondary | ICD-10-CM | POA: Diagnosis not present

## 2019-06-22 DIAGNOSIS — M79671 Pain in right foot: Secondary | ICD-10-CM | POA: Diagnosis not present

## 2019-06-23 DIAGNOSIS — M79671 Pain in right foot: Secondary | ICD-10-CM | POA: Diagnosis not present

## 2019-07-01 DIAGNOSIS — D509 Iron deficiency anemia, unspecified: Secondary | ICD-10-CM | POA: Diagnosis not present

## 2019-07-01 DIAGNOSIS — N189 Chronic kidney disease, unspecified: Secondary | ICD-10-CM | POA: Diagnosis not present

## 2019-07-01 DIAGNOSIS — D631 Anemia in chronic kidney disease: Secondary | ICD-10-CM | POA: Diagnosis not present

## 2019-07-06 DIAGNOSIS — N183 Chronic kidney disease, stage 3 unspecified: Secondary | ICD-10-CM | POA: Diagnosis not present

## 2019-07-06 DIAGNOSIS — N189 Chronic kidney disease, unspecified: Secondary | ICD-10-CM | POA: Diagnosis not present

## 2019-07-06 DIAGNOSIS — E782 Mixed hyperlipidemia: Secondary | ICD-10-CM | POA: Diagnosis not present

## 2019-07-06 DIAGNOSIS — M1711 Unilateral primary osteoarthritis, right knee: Secondary | ICD-10-CM | POA: Diagnosis not present

## 2019-07-06 DIAGNOSIS — M1712 Unilateral primary osteoarthritis, left knee: Secondary | ICD-10-CM | POA: Diagnosis not present

## 2019-07-06 DIAGNOSIS — I10 Essential (primary) hypertension: Secondary | ICD-10-CM | POA: Diagnosis not present

## 2019-07-06 DIAGNOSIS — I739 Peripheral vascular disease, unspecified: Secondary | ICD-10-CM | POA: Diagnosis not present

## 2019-07-15 DIAGNOSIS — N189 Chronic kidney disease, unspecified: Secondary | ICD-10-CM | POA: Diagnosis not present

## 2019-07-15 DIAGNOSIS — D631 Anemia in chronic kidney disease: Secondary | ICD-10-CM | POA: Diagnosis not present

## 2019-07-15 DIAGNOSIS — D509 Iron deficiency anemia, unspecified: Secondary | ICD-10-CM | POA: Diagnosis not present

## 2019-07-22 DIAGNOSIS — D509 Iron deficiency anemia, unspecified: Secondary | ICD-10-CM | POA: Diagnosis not present

## 2019-08-02 DIAGNOSIS — D649 Anemia, unspecified: Secondary | ICD-10-CM | POA: Diagnosis not present

## 2019-10-03 DIAGNOSIS — D631 Anemia in chronic kidney disease: Secondary | ICD-10-CM | POA: Diagnosis not present

## 2019-10-03 DIAGNOSIS — D509 Iron deficiency anemia, unspecified: Secondary | ICD-10-CM | POA: Diagnosis not present

## 2019-10-03 DIAGNOSIS — N189 Chronic kidney disease, unspecified: Secondary | ICD-10-CM | POA: Diagnosis not present

## 2020-01-02 ENCOUNTER — Other Ambulatory Visit: Payer: Self-pay | Admitting: Oncology

## 2020-01-02 DIAGNOSIS — D649 Anemia, unspecified: Secondary | ICD-10-CM

## 2020-01-02 NOTE — Progress Notes (Signed)
[facility]  [address] [place],  [place]  [address] [phone]  Clinic Day:  01/03/2020  Referring physician: Lonie Peak, PA-C   HISTORY OF PRESENT ILLNESS:  The patient is a 86 y.o. female with anemia secondary to both chronic renal insufficiency and previous iron deficiency.  She comes in today to reassess her hemoglobin.  Despite her hemoglobin being low 9 at her last visit, the patient felt fine to where she wished to be managed conservatively.  Since her last visit, the patient has been doing fairly well.  She denies having increased fatigue or any overt forms of blood loss which concern her for progressive anemia.    PHYSICAL EXAM:  Blood pressure (!) 183/74, pulse 64, temperature 98.5 F (36.9 C), resp. rate 16, height 5\' 1"  (1.549 m), weight 109 lb 9.6 oz (49.7 kg), SpO2 94 %. Wt Readings from Last 3 Encounters:  01/03/20 109 lb 9.6 oz (49.7 kg)  08/02/13 135 lb (61.2 kg)  04/18/13 129 lb 12.8 oz (58.9 kg)   Body mass index is 20.71 kg/m. Performance status (ECOG): 2 - Symptomatic, <50% confined to bed Physical Exam Constitutional:      Appearance: Normal appearance. She is not ill-appearing (she is in a wheelchair).  HENT:     Mouth/Throat:     Mouth: Mucous membranes are moist.     Pharynx: Oropharynx is clear. No oropharyngeal exudate or posterior oropharyngeal erythema.  Cardiovascular:     Rate and Rhythm: Normal rate and regular rhythm.     Heart sounds: No murmur heard.  No friction rub. No gallop.   Pulmonary:     Effort: Pulmonary effort is normal. No respiratory distress.     Breath sounds: Normal breath sounds. No wheezing, rhonchi or rales.  Abdominal:     General: Bowel sounds are normal. There is no distension.     Palpations: Abdomen is soft. There is no mass.     Tenderness: There is no abdominal tenderness.  Musculoskeletal:        General: No swelling.     Right lower leg: No edema.     Left lower leg:  No edema.  Lymphadenopathy:     Cervical: No cervical adenopathy.     Upper Body:     Right upper body: No supraclavicular or axillary adenopathy.     Left upper body: No supraclavicular or axillary adenopathy.     Lower Body: No right inguinal adenopathy. No left inguinal adenopathy.  Skin:    General: Skin is warm.     Coloration: Skin is not jaundiced.     Findings: No lesion or rash.  Neurological:     General: No focal deficit present.     Mental Status: She is alert and oriented to person, place, and time. Mental status is at baseline.     Cranial Nerves: Cranial nerves are intact.  Psychiatric:        Mood and Affect: Mood normal.        Behavior: Behavior normal.        Thought Content: Thought content normal.     LABS:  Results for DANYALE, RIDINGER (MRN Kathlene Cote) as of 01/03/2020 21:47  Ref. Range 01/03/2020 00:00 01/03/2020 13:52  Sodium Latest Ref Range: 137 - 147  141   Potassium Latest Ref Range: 3.4 - 5.3  4.0   Chloride Latest Ref Range: 99 - 108  107   CO2 Latest Ref Range: 13 - 22  25 (A)   Glucose Unknown 122   BUN Latest Ref Range: 4 - 21  41 (A)   Creatinine Latest Ref Range: 0.5 - 1.1  1.1   Calcium Latest Ref Range: 8.7 - 10.7  8.9   Alkaline Phosphatase Latest Ref Range: 25 - 125  147 (A)   Albumin Latest Ref Range: 3.5 - 5.0  3.5   AST Latest Ref Range: 13 - 35  23   ALT Latest Ref Range: 7 - 35  12   Bilirubin, Total Unknown 0.3   Iron Latest Ref Range: 28 - 170 ug/dL  33  UIBC Latest Units: ug/dL  417  TIBC Latest Ref Range: 250 - 450 ug/dL  408 (L)  Saturation Ratios Latest Ref Range: 10.4 - 31.8 %  13  Ferritin Latest Ref Range: 11 - 307 ng/mL  660 (H)  WBC Unknown 7.1   RBC Latest Ref Range: 3.87 - 5.11  3.15 (A)   Hemoglobin Latest Ref Range: 12.0 - 16.0  9.1 (A)   HCT Latest Ref Range: 36 - 46  30 (A)      ASSESSMENT & PLAN:  Assessment/Plan:  A 86 y.o. female with anemia secondary to chronic renal insufficiency and iron  deficiency. Her hemoglobin of 9.1 is slightly better than what it was previously.  Her iron parameters still show no evidence of iron deficiency anemia.  Her kidney disease remains relatively mild.  As the patient's hemoglobin is essentially stable and she is clinically doing well, she wishes to remain conservative with her anemia management.  I have no problem acquiescing to her wishes.  I will see her back in 4 months to reassess her anemia. The patient understands all the plans discussed today and is in agreement with them.     Lavelle Berland Kirby Funk, MD

## 2020-01-03 ENCOUNTER — Other Ambulatory Visit: Payer: Self-pay

## 2020-01-03 ENCOUNTER — Inpatient Hospital Stay: Payer: Medicare Other | Attending: Oncology

## 2020-01-03 ENCOUNTER — Other Ambulatory Visit: Payer: Self-pay | Admitting: Hematology and Oncology

## 2020-01-03 ENCOUNTER — Inpatient Hospital Stay (INDEPENDENT_AMBULATORY_CARE_PROVIDER_SITE_OTHER): Payer: Medicare Other | Admitting: Oncology

## 2020-01-03 ENCOUNTER — Other Ambulatory Visit: Payer: Self-pay | Admitting: Oncology

## 2020-01-03 VITALS — BP 183/74 | HR 64 | Temp 98.5°F | Resp 16 | Ht 61.0 in | Wt 109.6 lb

## 2020-01-03 DIAGNOSIS — N189 Chronic kidney disease, unspecified: Secondary | ICD-10-CM | POA: Insufficient documentation

## 2020-01-03 DIAGNOSIS — D649 Anemia, unspecified: Secondary | ICD-10-CM

## 2020-01-03 DIAGNOSIS — D631 Anemia in chronic kidney disease: Secondary | ICD-10-CM | POA: Insufficient documentation

## 2020-01-03 DIAGNOSIS — Z0001 Encounter for general adult medical examination with abnormal findings: Secondary | ICD-10-CM | POA: Diagnosis not present

## 2020-01-03 LAB — BASIC METABOLIC PANEL
BUN: 41 — AB (ref 4–21)
CO2: 25 — AB (ref 13–22)
Chloride: 107 (ref 99–108)
Creatinine: 1.1 (ref 0.5–1.1)
Glucose: 122
Potassium: 4 (ref 3.4–5.3)
Sodium: 141 (ref 137–147)

## 2020-01-03 LAB — HEPATIC FUNCTION PANEL
ALT: 12 (ref 7–35)
AST: 23 (ref 13–35)
Alkaline Phosphatase: 147 — AB (ref 25–125)
Bilirubin, Total: 0.3

## 2020-01-03 LAB — CBC AND DIFFERENTIAL
HCT: 30 — AB (ref 36–46)
Hemoglobin: 9.1 — AB (ref 12.0–16.0)
Neutrophils Absolute: 4.97
Platelets: 367 (ref 150–399)
WBC: 7.1

## 2020-01-03 LAB — IRON AND TIBC
Iron: 33 ug/dL (ref 28–170)
Saturation Ratios: 13 % (ref 10.4–31.8)
TIBC: 249 ug/dL — ABNORMAL LOW (ref 250–450)
UIBC: 216 ug/dL

## 2020-01-03 LAB — COMPREHENSIVE METABOLIC PANEL
Albumin: 3.5 (ref 3.5–5.0)
Calcium: 8.9 (ref 8.7–10.7)

## 2020-01-03 LAB — CBC: RBC: 3.15 — AB (ref 3.87–5.11)

## 2020-01-03 LAB — FERRITIN: Ferritin: 660 ng/mL — ABNORMAL HIGH (ref 11–307)

## 2020-01-10 DIAGNOSIS — Z139 Encounter for screening, unspecified: Secondary | ICD-10-CM | POA: Diagnosis not present

## 2020-01-10 DIAGNOSIS — M170 Bilateral primary osteoarthritis of knee: Secondary | ICD-10-CM | POA: Diagnosis not present

## 2020-01-10 DIAGNOSIS — I10 Essential (primary) hypertension: Secondary | ICD-10-CM | POA: Diagnosis not present

## 2020-01-10 DIAGNOSIS — I69359 Hemiplegia and hemiparesis following cerebral infarction affecting unspecified side: Secondary | ICD-10-CM | POA: Diagnosis not present

## 2020-01-10 DIAGNOSIS — Z9181 History of falling: Secondary | ICD-10-CM | POA: Diagnosis not present

## 2020-01-10 DIAGNOSIS — Z1331 Encounter for screening for depression: Secondary | ICD-10-CM | POA: Diagnosis not present

## 2020-01-10 DIAGNOSIS — I739 Peripheral vascular disease, unspecified: Secondary | ICD-10-CM | POA: Diagnosis not present

## 2020-02-21 DIAGNOSIS — R609 Edema, unspecified: Secondary | ICD-10-CM | POA: Diagnosis not present

## 2020-02-21 DIAGNOSIS — Z6820 Body mass index (BMI) 20.0-20.9, adult: Secondary | ICD-10-CM | POA: Diagnosis not present

## 2020-02-21 DIAGNOSIS — I10 Essential (primary) hypertension: Secondary | ICD-10-CM | POA: Diagnosis not present

## 2020-04-09 DIAGNOSIS — Z1331 Encounter for screening for depression: Secondary | ICD-10-CM | POA: Diagnosis not present

## 2020-04-09 DIAGNOSIS — Z0000 Encounter for general adult medical examination without abnormal findings: Secondary | ICD-10-CM | POA: Diagnosis not present

## 2020-04-09 DIAGNOSIS — E785 Hyperlipidemia, unspecified: Secondary | ICD-10-CM | POA: Diagnosis not present

## 2020-04-09 DIAGNOSIS — Z9181 History of falling: Secondary | ICD-10-CM | POA: Diagnosis not present

## 2020-04-30 DIAGNOSIS — I739 Peripheral vascular disease, unspecified: Secondary | ICD-10-CM | POA: Diagnosis not present

## 2020-04-30 DIAGNOSIS — I10 Essential (primary) hypertension: Secondary | ICD-10-CM | POA: Diagnosis not present

## 2020-04-30 DIAGNOSIS — Z6820 Body mass index (BMI) 20.0-20.9, adult: Secondary | ICD-10-CM | POA: Diagnosis not present

## 2020-04-30 DIAGNOSIS — M170 Bilateral primary osteoarthritis of knee: Secondary | ICD-10-CM | POA: Diagnosis not present

## 2020-04-30 DIAGNOSIS — H6122 Impacted cerumen, left ear: Secondary | ICD-10-CM | POA: Diagnosis not present

## 2020-04-30 DIAGNOSIS — N183 Chronic kidney disease, stage 3 unspecified: Secondary | ICD-10-CM | POA: Diagnosis not present

## 2020-04-30 DIAGNOSIS — R609 Edema, unspecified: Secondary | ICD-10-CM | POA: Diagnosis not present

## 2020-04-30 DIAGNOSIS — I69359 Hemiplegia and hemiparesis following cerebral infarction affecting unspecified side: Secondary | ICD-10-CM | POA: Diagnosis not present

## 2020-04-30 DIAGNOSIS — G20 Parkinson's disease: Secondary | ICD-10-CM | POA: Diagnosis not present

## 2020-05-03 ENCOUNTER — Inpatient Hospital Stay: Payer: Medicare Other

## 2020-05-03 ENCOUNTER — Inpatient Hospital Stay: Payer: Medicare Other | Admitting: Oncology

## 2020-05-15 ENCOUNTER — Inpatient Hospital Stay: Payer: Medicare Other | Attending: Oncology

## 2020-05-15 ENCOUNTER — Other Ambulatory Visit: Payer: Self-pay | Admitting: Oncology

## 2020-05-15 ENCOUNTER — Inpatient Hospital Stay (INDEPENDENT_AMBULATORY_CARE_PROVIDER_SITE_OTHER): Payer: Medicare Other | Admitting: Oncology

## 2020-05-15 ENCOUNTER — Other Ambulatory Visit: Payer: Self-pay | Admitting: Hematology and Oncology

## 2020-05-15 ENCOUNTER — Telehealth: Payer: Self-pay | Admitting: Oncology

## 2020-05-15 ENCOUNTER — Other Ambulatory Visit: Payer: Self-pay

## 2020-05-15 VITALS — BP 168/112 | HR 62 | Temp 98.7°F | Resp 14 | Ht 61.0 in

## 2020-05-15 DIAGNOSIS — D649 Anemia, unspecified: Secondary | ICD-10-CM

## 2020-05-15 DIAGNOSIS — N189 Chronic kidney disease, unspecified: Secondary | ICD-10-CM | POA: Insufficient documentation

## 2020-05-15 DIAGNOSIS — D631 Anemia in chronic kidney disease: Secondary | ICD-10-CM | POA: Insufficient documentation

## 2020-05-15 LAB — CBC
MCV: 94 (ref 81–99)
RBC: 3.31 — AB (ref 3.87–5.11)

## 2020-05-15 LAB — CBC AND DIFFERENTIAL
HCT: 31 — AB (ref 36–46)
Hemoglobin: 9.7 — AB (ref 12.0–16.0)
Neutrophils Absolute: 4.89
Platelets: 365 (ref 150–399)
WBC: 7.3

## 2020-05-15 LAB — BASIC METABOLIC PANEL
BUN: 33 — AB (ref 4–21)
CO2: 22 (ref 13–22)
Chloride: 108 (ref 99–108)
Creatinine: 1 (ref 0.5–1.1)
Glucose: 95
Potassium: 4.8 (ref 3.4–5.3)
Sodium: 139 (ref 137–147)

## 2020-05-15 LAB — FERRITIN: Ferritin: 659 ng/mL — ABNORMAL HIGH (ref 11–307)

## 2020-05-15 LAB — HEPATIC FUNCTION PANEL
ALT: 11 (ref 7–35)
AST: 21 (ref 13–35)
Alkaline Phosphatase: 167 — AB (ref 25–125)
Bilirubin, Total: 0.2

## 2020-05-15 LAB — IRON AND TIBC
Iron: 21 ug/dL — ABNORMAL LOW (ref 28–170)
Saturation Ratios: 8 % — ABNORMAL LOW (ref 10.4–31.8)
TIBC: 250 ug/dL (ref 250–450)
UIBC: 229 ug/dL

## 2020-05-15 LAB — COMPREHENSIVE METABOLIC PANEL
Albumin: 3.5 (ref 3.5–5.0)
Calcium: 8.5 — AB (ref 8.7–10.7)

## 2020-05-15 NOTE — Telephone Encounter (Signed)
Per 4/5 LOS, patient scheduled for July Appt's.  Gave patient Appt Summary

## 2020-05-15 NOTE — Progress Notes (Signed)
[name]  [address] [place],  [place]  [address] [phone]  Clinic Day:  05/15/2020  Referring physician: Lonie Peak, PA-C   HISTORY OF PRESENT ILLNESS:  The patient is a 87 y.o. female with anemia secondary to both chronic renal insufficiency and previous iron deficiency.  She comes in today to reassess her hemoglobin.  Despite her hemoglobin being low at 9 at her last visit, the patient felt fine to where she wished to be managed conservatively.  Since her last visit, the patient has been doing fairly well.  She denies having increased fatigue or any overt forms of blood loss which concern her for having progressive anemia.    PHYSICAL EXAM:  Blood pressure (!) 168/112, pulse 62, temperature 98.7 F (37.1 C), resp. rate 14, height 5\' 1"  (1.549 m), SpO2 97 %. Wt Readings from Last 3 Encounters:  01/03/20 109 lb 9.6 oz (49.7 kg)  08/02/13 135 lb (61.2 kg)  04/18/13 129 lb 12.8 oz (58.9 kg)   Body mass index is 20.71 kg/m. Performance status (ECOG): 2 - Symptomatic, <50% confined to bed Physical Exam Constitutional:      Appearance: Normal appearance. She is not ill-appearing (she is in a wheelchair).  HENT:     Mouth/Throat:     Mouth: Mucous membranes are moist.     Pharynx: Oropharynx is clear. No oropharyngeal exudate or posterior oropharyngeal erythema.  Cardiovascular:     Rate and Rhythm: Normal rate and regular rhythm.     Heart sounds: No murmur heard. No friction rub. No gallop.   Pulmonary:     Effort: Pulmonary effort is normal. No respiratory distress.     Breath sounds: Normal breath sounds. No wheezing, rhonchi or rales.  Chest:  Breasts:     Right: No axillary adenopathy or supraclavicular adenopathy.     Left: No axillary adenopathy or supraclavicular adenopathy.    Abdominal:     General: Bowel sounds are normal. There is no distension.     Palpations: Abdomen is soft. There is no mass.     Tenderness:  There is no abdominal tenderness.  Musculoskeletal:        General: No swelling.     Right lower leg: No edema.     Left lower leg: No edema.  Lymphadenopathy:     Cervical: No cervical adenopathy.     Upper Body:     Right upper body: No supraclavicular or axillary adenopathy.     Left upper body: No supraclavicular or axillary adenopathy.     Lower Body: No right inguinal adenopathy. No left inguinal adenopathy.  Skin:    General: Skin is warm.     Coloration: Skin is not jaundiced.     Findings: No lesion or rash.  Neurological:     General: No focal deficit present.     Mental Status: She is alert and oriented to person, place, and time. Mental status is at baseline.     Cranial Nerves: Cranial nerves are intact.  Psychiatric:        Mood and Affect: Mood normal.        Behavior: Behavior normal.        Thought Content: Thought content normal.     LABS:    Ref. Range 05/15/2020 00:00 05/15/2020 14:47  Sodium Latest Ref Range: 137 - 147  139   Potassium Latest Ref Range: 3.4 - 5.3  4.8   Chloride Latest Ref Range: 99 - 108  108   CO2 Latest Ref Range: 13 - 22  22   Glucose Unknown 95   BUN Latest Ref Range: 4 - 21  33 (A)   Creatinine Latest Ref Range: 0.5 - 1.1  1.0   Calcium Latest Ref Range: 8.7 - 10.7  8.5 (A)   Alkaline Phosphatase Latest Ref Range: 25 - 125  167 (A)   Albumin Latest Ref Range: 3.5 - 5.0  3.5   AST Latest Ref Range: 13 - 35  21   ALT Latest Ref Range: 7 - 35  11   Bilirubin, Total Unknown 0.2   Iron Latest Ref Range: 28 - 170 ug/dL  21 (L)  UIBC Latest Units: ug/dL  778  TIBC Latest Ref Range: 250 - 450 ug/dL  242  Saturation Ratios Latest Ref Range: 10.4 - 31.8 %  8 (L)  Ferritin Latest Ref Range: 11 - 307 ng/mL  659 (H)  WBC Unknown 7.3   RBC Latest Ref Range: 3.87 - 5.11  3.31 (A)   Hemoglobin Latest Ref Range: 12.0 - 16.0  9.7 (A)   HCT Latest Ref Range: 36 - 46  31 (A)   MCV Latest Ref Range: 81 - 99  94   Platelets Latest Ref Range: 150 -  399  365     ASSESSMENT & PLAN:  Assessment/Plan:  A 87 y.o. female with anemia secondary to chronic renal insufficiency and iron deficiency. Her hemoglobin of 9.7 is better than what it was at her last visit.  Her iron parameters are consistent with anemia of chronic disease, not iron deficiency anemia.  Her kidney disease remains relatively mild.  As the patient's hemoglobin is essentially stable and she is clinically doing well, she wishes to remain conservative with her anemia management.  I have no problem acquiescing to her wishes.  I will see her back in 3 months to reassess her anemia. The patient understands all the plans discussed today and is in agreement with them.     Maria Hanna Kirby Funk, MD

## 2020-08-15 ENCOUNTER — Telehealth: Payer: Self-pay | Admitting: Oncology

## 2020-08-15 NOTE — Telephone Encounter (Signed)
Patient rescheduled from 7/8 to 7/12 Labs 2:30 pm - Follow Up 3:00 pm

## 2020-08-17 ENCOUNTER — Inpatient Hospital Stay: Payer: Medicare Other | Admitting: Oncology

## 2020-08-17 ENCOUNTER — Inpatient Hospital Stay: Payer: Medicare Other

## 2020-08-21 ENCOUNTER — Inpatient Hospital Stay: Payer: Medicare Other | Admitting: Oncology

## 2020-08-21 ENCOUNTER — Inpatient Hospital Stay: Payer: Medicare Other

## 2020-08-21 ENCOUNTER — Telehealth: Payer: Self-pay | Admitting: Oncology

## 2020-08-21 NOTE — Telephone Encounter (Signed)
Patient rescheduled today's Appt to 7/19 Labs 2:15 pm - Follow Up 2:45 pm due to No Transportation

## 2020-08-23 NOTE — Progress Notes (Signed)
[facility]  [address] [place],  [place]  [address] [phone]  Clinic Day:  08/28/2020  Referring physician: Lonie Peak, PA-C  This document serves as a record of services personally performed by Weston Settle, MD. It was created on their behalf by [facility] E, a trained medical scribe. The creation of this record is based on the scribe's personal observations and the provider's statements to them.  HISTORY OF PRESENT ILLNESS:  The patient is a 87 y.o. female with anemia secondary to both chronic renal insufficiency and previous iron deficiency.  She comes in today to reassess her hemoglobin.  At her last visit, as her hemoglobin was 9.7 and she was clinically doing well, she elected not to restart red cell shot therapy.  Since her last visit, the patient has been doing fairly well.  She denies having increased fatigue or any overt forms of blood loss which concern her for having progressive anemia.    PHYSICAL EXAM:  Blood pressure (!) 190/74, pulse 67, temperature 98.4 F (36.9 C), temperature source Oral, resp. rate 18, height 5\' 1"  (1.549 m), SpO2 97 %. Wt Readings from Last 3 Encounters:  01/03/20 109 lb 9.6 oz (49.7 kg)  08/02/13 135 lb (61.2 kg)  04/18/13 129 lb 12.8 oz (58.9 kg)   Body mass index is 20.71 kg/m. Performance status (ECOG): 2 - Symptomatic, <50% confined to bed Physical Exam Constitutional:      Appearance: Normal appearance. She is not ill-appearing (she is in a wheelchair).  HENT:     Mouth/Throat:     Mouth: Mucous membranes are moist.     Pharynx: Oropharynx is clear. No oropharyngeal exudate or posterior oropharyngeal erythema.  Cardiovascular:     Rate and Rhythm: Normal rate and regular rhythm.     Heart sounds: No murmur heard.   No friction rub. No gallop.  Pulmonary:     Effort: Pulmonary effort is normal. No respiratory distress.     Breath sounds: Normal breath sounds. No wheezing, rhonchi or  rales.  Chest:  Breasts:    Right: No axillary adenopathy or supraclavicular adenopathy.     Left: No axillary adenopathy or supraclavicular adenopathy.  Abdominal:     General: Bowel sounds are normal. There is no distension.     Palpations: Abdomen is soft. There is no mass.     Tenderness: There is no abdominal tenderness.  Musculoskeletal:        General: No swelling.     Right lower leg: No edema.     Left lower leg: No edema.  Lymphadenopathy:     Cervical: No cervical adenopathy.     Upper Body:     Right upper body: No supraclavicular or axillary adenopathy.     Left upper body: No supraclavicular or axillary adenopathy.     Lower Body: No right inguinal adenopathy. No left inguinal adenopathy.  Skin:    General: Skin is warm.     Coloration: Skin is not jaundiced.     Findings: No lesion or rash.  Neurological:     General: No focal deficit present.     Mental Status: She is alert and oriented to person, place, and time. Mental status is at baseline.     Cranial Nerves: Cranial nerves are intact.  Psychiatric:        Mood and Affect: Mood normal.        Behavior: Behavior normal.        Thought  Content: Thought content normal.    LABS:      Ref. Range 08/28/2020 15:13  Iron Latest Ref Range: 28 - 170 ug/dL 19 (L)  UIBC Latest Units: ug/dL 604  TIBC Latest Ref Range: 250 - 450 ug/dL 540 (L)  Saturation Ratios Latest Ref Range: 10.4 - 31.8 % 8 (L)  Ferritin Latest Ref Range: 11 - 307 ng/mL 693 (H)   ASSESSMENT & PLAN:  Assessment/Plan:  A 87 y.o. female with anemia secondary to chronic renal insufficiency and iron deficiency.  As her hemoglobin has held stable at 9.7 without any particular intervention, she wishes to remain conservative with her disease management.  I have no problem acquiescing to her wishes.  I will see her back in 4 months for repeat clinical assessment.  The patient understands all the plans discussed today and is in agreement with them.    I,  Foye Deer, am acting as scribe for Weston Settle, MD    I have reviewed this report as typed by the medical scribe, and it is complete and accurate.  Cecely Rengel Kirby Funk, MD

## 2020-08-28 ENCOUNTER — Other Ambulatory Visit: Payer: Self-pay | Admitting: Oncology

## 2020-08-28 ENCOUNTER — Inpatient Hospital Stay: Payer: Medicare Other | Attending: Oncology

## 2020-08-28 ENCOUNTER — Other Ambulatory Visit: Payer: Self-pay

## 2020-08-28 ENCOUNTER — Encounter: Payer: Self-pay | Admitting: Oncology

## 2020-08-28 ENCOUNTER — Inpatient Hospital Stay (INDEPENDENT_AMBULATORY_CARE_PROVIDER_SITE_OTHER): Payer: Medicare Other | Admitting: Oncology

## 2020-08-28 DIAGNOSIS — D649 Anemia, unspecified: Secondary | ICD-10-CM

## 2020-08-28 DIAGNOSIS — D631 Anemia in chronic kidney disease: Secondary | ICD-10-CM

## 2020-08-28 DIAGNOSIS — N189 Chronic kidney disease, unspecified: Secondary | ICD-10-CM

## 2020-08-28 HISTORY — DX: Anemia in chronic kidney disease: D63.1

## 2020-08-28 LAB — FERRITIN: Ferritin: 693 ng/mL — ABNORMAL HIGH (ref 11–307)

## 2020-08-28 LAB — CBC AND DIFFERENTIAL
HCT: 31 — AB (ref 36–46)
Hemoglobin: 9.7 — AB (ref 12.0–16.0)
Neutrophils Absolute: 4.94
Platelets: 388 (ref 150–399)
WBC: 7.6

## 2020-08-28 LAB — IRON AND TIBC
Iron: 19 ug/dL — ABNORMAL LOW (ref 28–170)
Saturation Ratios: 8 % — ABNORMAL LOW (ref 10.4–31.8)
TIBC: 225 ug/dL — ABNORMAL LOW (ref 250–450)
UIBC: 206 ug/dL

## 2020-08-28 LAB — CBC: RBC: 3.4 — AB (ref 3.87–5.11)

## 2020-09-11 DIAGNOSIS — Z6820 Body mass index (BMI) 20.0-20.9, adult: Secondary | ICD-10-CM | POA: Diagnosis not present

## 2020-09-11 DIAGNOSIS — Z79899 Other long term (current) drug therapy: Secondary | ICD-10-CM | POA: Diagnosis not present

## 2020-09-11 DIAGNOSIS — K2970 Gastritis, unspecified, without bleeding: Secondary | ICD-10-CM | POA: Diagnosis not present

## 2020-09-11 DIAGNOSIS — E538 Deficiency of other specified B group vitamins: Secondary | ICD-10-CM | POA: Diagnosis not present

## 2020-09-11 DIAGNOSIS — I739 Peripheral vascular disease, unspecified: Secondary | ICD-10-CM | POA: Diagnosis not present

## 2020-09-11 DIAGNOSIS — G20 Parkinson's disease: Secondary | ICD-10-CM | POA: Diagnosis not present

## 2020-09-11 DIAGNOSIS — I10 Essential (primary) hypertension: Secondary | ICD-10-CM | POA: Diagnosis not present

## 2020-09-11 DIAGNOSIS — M170 Bilateral primary osteoarthritis of knee: Secondary | ICD-10-CM | POA: Diagnosis not present

## 2020-09-11 DIAGNOSIS — N183 Chronic kidney disease, stage 3 unspecified: Secondary | ICD-10-CM | POA: Diagnosis not present

## 2020-09-11 DIAGNOSIS — I69359 Hemiplegia and hemiparesis following cerebral infarction affecting unspecified side: Secondary | ICD-10-CM | POA: Diagnosis not present

## 2020-11-16 DIAGNOSIS — G20 Parkinson's disease: Secondary | ICD-10-CM | POA: Diagnosis not present

## 2020-11-16 DIAGNOSIS — U071 COVID-19: Secondary | ICD-10-CM | POA: Diagnosis not present

## 2020-11-16 DIAGNOSIS — N183 Chronic kidney disease, stage 3 unspecified: Secondary | ICD-10-CM | POA: Diagnosis not present

## 2020-12-03 DIAGNOSIS — I639 Cerebral infarction, unspecified: Secondary | ICD-10-CM | POA: Diagnosis not present

## 2020-12-03 DIAGNOSIS — N183 Chronic kidney disease, stage 3 unspecified: Secondary | ICD-10-CM | POA: Diagnosis not present

## 2020-12-03 DIAGNOSIS — I10 Essential (primary) hypertension: Secondary | ICD-10-CM | POA: Diagnosis not present

## 2020-12-03 DIAGNOSIS — E785 Hyperlipidemia, unspecified: Secondary | ICD-10-CM | POA: Diagnosis not present

## 2020-12-03 DIAGNOSIS — U071 COVID-19: Secondary | ICD-10-CM | POA: Diagnosis not present

## 2020-12-03 DIAGNOSIS — M8580 Other specified disorders of bone density and structure, unspecified site: Secondary | ICD-10-CM | POA: Diagnosis not present

## 2020-12-03 DIAGNOSIS — Z8616 Personal history of COVID-19: Secondary | ICD-10-CM | POA: Diagnosis not present

## 2020-12-03 DIAGNOSIS — Z79899 Other long term (current) drug therapy: Secondary | ICD-10-CM | POA: Diagnosis not present

## 2020-12-03 DIAGNOSIS — E559 Vitamin D deficiency, unspecified: Secondary | ICD-10-CM | POA: Diagnosis not present

## 2020-12-03 DIAGNOSIS — I69359 Hemiplegia and hemiparesis following cerebral infarction affecting unspecified side: Secondary | ICD-10-CM | POA: Diagnosis not present

## 2020-12-03 DIAGNOSIS — D518 Other vitamin B12 deficiency anemias: Secondary | ICD-10-CM | POA: Diagnosis not present

## 2020-12-03 DIAGNOSIS — K219 Gastro-esophageal reflux disease without esophagitis: Secondary | ICD-10-CM | POA: Diagnosis not present

## 2020-12-03 DIAGNOSIS — M6259 Muscle wasting and atrophy, not elsewhere classified, multiple sites: Secondary | ICD-10-CM | POA: Diagnosis not present

## 2020-12-03 DIAGNOSIS — Z23 Encounter for immunization: Secondary | ICD-10-CM | POA: Diagnosis not present

## 2020-12-03 DIAGNOSIS — M170 Bilateral primary osteoarthritis of knee: Secondary | ICD-10-CM | POA: Diagnosis not present

## 2020-12-03 DIAGNOSIS — M6281 Muscle weakness (generalized): Secondary | ICD-10-CM | POA: Diagnosis not present

## 2020-12-03 DIAGNOSIS — E782 Mixed hyperlipidemia: Secondary | ICD-10-CM | POA: Diagnosis not present

## 2020-12-03 DIAGNOSIS — M159 Polyosteoarthritis, unspecified: Secondary | ICD-10-CM | POA: Diagnosis not present

## 2020-12-03 DIAGNOSIS — N189 Chronic kidney disease, unspecified: Secondary | ICD-10-CM | POA: Diagnosis not present

## 2020-12-03 DIAGNOSIS — I739 Peripheral vascular disease, unspecified: Secondary | ICD-10-CM | POA: Diagnosis not present

## 2020-12-03 DIAGNOSIS — K5900 Constipation, unspecified: Secondary | ICD-10-CM | POA: Diagnosis not present

## 2020-12-03 DIAGNOSIS — E538 Deficiency of other specified B group vitamins: Secondary | ICD-10-CM | POA: Diagnosis not present

## 2020-12-03 DIAGNOSIS — G20 Parkinson's disease: Secondary | ICD-10-CM | POA: Diagnosis not present

## 2020-12-05 DIAGNOSIS — I739 Peripheral vascular disease, unspecified: Secondary | ICD-10-CM | POA: Diagnosis not present

## 2020-12-05 DIAGNOSIS — K5900 Constipation, unspecified: Secondary | ICD-10-CM | POA: Diagnosis not present

## 2020-12-05 DIAGNOSIS — K219 Gastro-esophageal reflux disease without esophagitis: Secondary | ICD-10-CM | POA: Diagnosis not present

## 2020-12-05 DIAGNOSIS — I639 Cerebral infarction, unspecified: Secondary | ICD-10-CM | POA: Diagnosis not present

## 2020-12-05 DIAGNOSIS — M6281 Muscle weakness (generalized): Secondary | ICD-10-CM | POA: Diagnosis not present

## 2020-12-05 DIAGNOSIS — E785 Hyperlipidemia, unspecified: Secondary | ICD-10-CM | POA: Diagnosis not present

## 2020-12-05 DIAGNOSIS — D518 Other vitamin B12 deficiency anemias: Secondary | ICD-10-CM | POA: Diagnosis not present

## 2020-12-05 DIAGNOSIS — M159 Polyosteoarthritis, unspecified: Secondary | ICD-10-CM | POA: Diagnosis not present

## 2020-12-05 DIAGNOSIS — U071 COVID-19: Secondary | ICD-10-CM | POA: Diagnosis not present

## 2020-12-05 DIAGNOSIS — I10 Essential (primary) hypertension: Secondary | ICD-10-CM | POA: Diagnosis not present

## 2020-12-05 DIAGNOSIS — G20 Parkinson's disease: Secondary | ICD-10-CM | POA: Diagnosis not present

## 2020-12-05 DIAGNOSIS — N183 Chronic kidney disease, stage 3 unspecified: Secondary | ICD-10-CM | POA: Diagnosis not present

## 2020-12-06 DIAGNOSIS — I739 Peripheral vascular disease, unspecified: Secondary | ICD-10-CM | POA: Diagnosis not present

## 2020-12-06 DIAGNOSIS — M6281 Muscle weakness (generalized): Secondary | ICD-10-CM | POA: Diagnosis not present

## 2020-12-06 DIAGNOSIS — E785 Hyperlipidemia, unspecified: Secondary | ICD-10-CM | POA: Diagnosis not present

## 2020-12-06 DIAGNOSIS — K219 Gastro-esophageal reflux disease without esophagitis: Secondary | ICD-10-CM | POA: Diagnosis not present

## 2020-12-06 DIAGNOSIS — D518 Other vitamin B12 deficiency anemias: Secondary | ICD-10-CM | POA: Diagnosis not present

## 2020-12-06 DIAGNOSIS — M159 Polyosteoarthritis, unspecified: Secondary | ICD-10-CM | POA: Diagnosis not present

## 2020-12-06 DIAGNOSIS — I639 Cerebral infarction, unspecified: Secondary | ICD-10-CM | POA: Diagnosis not present

## 2020-12-06 DIAGNOSIS — I10 Essential (primary) hypertension: Secondary | ICD-10-CM | POA: Diagnosis not present

## 2020-12-06 DIAGNOSIS — G20 Parkinson's disease: Secondary | ICD-10-CM | POA: Diagnosis not present

## 2020-12-06 DIAGNOSIS — N183 Chronic kidney disease, stage 3 unspecified: Secondary | ICD-10-CM | POA: Diagnosis not present

## 2020-12-17 DIAGNOSIS — K219 Gastro-esophageal reflux disease without esophagitis: Secondary | ICD-10-CM | POA: Diagnosis not present

## 2020-12-17 DIAGNOSIS — E785 Hyperlipidemia, unspecified: Secondary | ICD-10-CM | POA: Diagnosis not present

## 2020-12-17 DIAGNOSIS — I10 Essential (primary) hypertension: Secondary | ICD-10-CM | POA: Diagnosis not present

## 2020-12-17 DIAGNOSIS — I639 Cerebral infarction, unspecified: Secondary | ICD-10-CM | POA: Diagnosis not present

## 2020-12-17 DIAGNOSIS — K5900 Constipation, unspecified: Secondary | ICD-10-CM | POA: Diagnosis not present

## 2020-12-17 DIAGNOSIS — N183 Chronic kidney disease, stage 3 unspecified: Secondary | ICD-10-CM | POA: Diagnosis not present

## 2020-12-17 DIAGNOSIS — D518 Other vitamin B12 deficiency anemias: Secondary | ICD-10-CM | POA: Diagnosis not present

## 2020-12-17 DIAGNOSIS — G20 Parkinson's disease: Secondary | ICD-10-CM | POA: Diagnosis not present

## 2020-12-17 DIAGNOSIS — E559 Vitamin D deficiency, unspecified: Secondary | ICD-10-CM | POA: Diagnosis not present

## 2020-12-21 DIAGNOSIS — M6281 Muscle weakness (generalized): Secondary | ICD-10-CM | POA: Diagnosis not present

## 2020-12-21 DIAGNOSIS — I10 Essential (primary) hypertension: Secondary | ICD-10-CM | POA: Diagnosis not present

## 2020-12-21 DIAGNOSIS — E785 Hyperlipidemia, unspecified: Secondary | ICD-10-CM | POA: Diagnosis not present

## 2020-12-21 DIAGNOSIS — D518 Other vitamin B12 deficiency anemias: Secondary | ICD-10-CM | POA: Diagnosis not present

## 2020-12-24 NOTE — Progress Notes (Incomplete)
[facility]  [address] [place],  [place]  [address] [phone]  Clinic Day:  12/31/2020  Referring physician: Lonie Peak, PA-C  This document serves as a record of services personally performed by Weston Settle, MD. It was created on their behalf by [facility] E, a trained medical scribe. The creation of this record is based on the scribe's personal observations and the provider's statements to them.  HISTORY OF PRESENT ILLNESS:  The patient is a 87 y.o. female with anemia secondary to both chronic renal insufficiency and previous iron deficiency.  She comes in today to reassess her hemoglobin.  Since her last visit, the patient has been doing fairly well.  She denies having increased fatigue or any overt forms of blood loss which concern her for having progressive anemia.    PHYSICAL EXAM:  There were no vitals taken for this visit. Wt Readings from Last 3 Encounters:  01/03/20 109 lb 9.6 oz (49.7 kg)  08/02/13 135 lb (61.2 kg)  04/18/13 129 lb 12.8 oz (58.9 kg)   There is no height or weight on file to calculate BMI. Performance status (ECOG): 2 - Symptomatic, <50% confined to bed Physical Exam Constitutional:      Appearance: Normal appearance. She is not ill-appearing (she is in a wheelchair).  HENT:     Mouth/Throat:     Mouth: Mucous membranes are moist.     Pharynx: Oropharynx is clear. No oropharyngeal exudate or posterior oropharyngeal erythema.  Cardiovascular:     Rate and Rhythm: Normal rate and regular rhythm.     Heart sounds: No murmur heard.   No friction rub. No gallop.  Pulmonary:     Effort: Pulmonary effort is normal. No respiratory distress.     Breath sounds: Normal breath sounds. No wheezing, rhonchi or rales.  Chest:  Breasts:    Right: No axillary adenopathy or supraclavicular adenopathy.     Left: No axillary adenopathy or supraclavicular adenopathy.  Abdominal:     General: Bowel sounds are normal.  There is no distension.     Palpations: Abdomen is soft. There is no mass.     Tenderness: There is no abdominal tenderness.  Musculoskeletal:        General: No swelling.     Right lower leg: No edema.     Left lower leg: No edema.  Lymphadenopathy:     Cervical: No cervical adenopathy.     Upper Body:     Right upper body: No supraclavicular or axillary adenopathy.     Left upper body: No supraclavicular or axillary adenopathy.     Lower Body: No right inguinal adenopathy. No left inguinal adenopathy.  Skin:    General: Skin is warm.     Coloration: Skin is not jaundiced.     Findings: No lesion or rash.  Neurological:     General: No focal deficit present.     Mental Status: She is alert and oriented to person, place, and time. Mental status is at baseline.     Cranial Nerves: Cranial nerves are intact.  Psychiatric:        Mood and Affect: Mood normal.        Behavior: Behavior normal.        Thought Content: Thought content normal.    LABS:      Ref. Range 08/28/2020 15:13  Iron Latest Ref Range: 28 - 170 ug/dL 19 (L)  UIBC Latest Units: ug/dL 756  TIBC Latest  Ref Range: 250 - 450 ug/dL 225 (L)  Saturation Ratios Latest Ref Range: 10.4 - 31.8 % 8 (L)  Ferritin Latest Ref Range: 11 - 307 ng/mL 693 (H)   ASSESSMENT & PLAN:  Assessment/Plan:  A 87 y.o. female with anemia secondary to chronic renal insufficiency and iron deficiency.  As her hemoglobin has held stable at 9.7 without any particular intervention, she wishes to remain conservative with her disease management.  I have no problem acquiescing to her wishes.  I will see her back in 4 months for repeat clinical assessment.  The patient understands all the plans discussed today and is in agreement with them.    I, Rita Ohara, am acting as scribe for Marice Potter, MD    I have reviewed this report as typed by the medical scribe, and it is complete and accurate.  Dequincy Macarthur Critchley, MD

## 2020-12-25 DIAGNOSIS — K5900 Constipation, unspecified: Secondary | ICD-10-CM | POA: Diagnosis not present

## 2020-12-25 DIAGNOSIS — I10 Essential (primary) hypertension: Secondary | ICD-10-CM | POA: Diagnosis not present

## 2020-12-25 DIAGNOSIS — E785 Hyperlipidemia, unspecified: Secondary | ICD-10-CM | POA: Diagnosis not present

## 2020-12-25 DIAGNOSIS — I639 Cerebral infarction, unspecified: Secondary | ICD-10-CM | POA: Diagnosis not present

## 2020-12-31 ENCOUNTER — Other Ambulatory Visit: Payer: Medicare Other

## 2020-12-31 ENCOUNTER — Ambulatory Visit: Payer: Medicare Other | Admitting: Oncology

## 2021-01-07 DIAGNOSIS — E559 Vitamin D deficiency, unspecified: Secondary | ICD-10-CM | POA: Diagnosis not present

## 2021-01-07 DIAGNOSIS — I10 Essential (primary) hypertension: Secondary | ICD-10-CM | POA: Diagnosis not present

## 2021-01-07 DIAGNOSIS — G20 Parkinson's disease: Secondary | ICD-10-CM | POA: Diagnosis not present

## 2021-01-07 DIAGNOSIS — I639 Cerebral infarction, unspecified: Secondary | ICD-10-CM | POA: Diagnosis not present

## 2021-01-07 DIAGNOSIS — E785 Hyperlipidemia, unspecified: Secondary | ICD-10-CM | POA: Diagnosis not present

## 2021-01-09 DIAGNOSIS — R001 Bradycardia, unspecified: Secondary | ICD-10-CM | POA: Diagnosis not present

## 2021-01-09 DIAGNOSIS — I10 Essential (primary) hypertension: Secondary | ICD-10-CM | POA: Diagnosis not present

## 2021-02-13 DIAGNOSIS — E785 Hyperlipidemia, unspecified: Secondary | ICD-10-CM | POA: Diagnosis not present

## 2021-02-13 DIAGNOSIS — I10 Essential (primary) hypertension: Secondary | ICD-10-CM | POA: Diagnosis not present

## 2021-02-13 DIAGNOSIS — I739 Peripheral vascular disease, unspecified: Secondary | ICD-10-CM | POA: Diagnosis not present

## 2021-02-13 DIAGNOSIS — E559 Vitamin D deficiency, unspecified: Secondary | ICD-10-CM | POA: Diagnosis not present

## 2021-02-13 DIAGNOSIS — I639 Cerebral infarction, unspecified: Secondary | ICD-10-CM | POA: Diagnosis not present

## 2021-03-06 DIAGNOSIS — I739 Peripheral vascular disease, unspecified: Secondary | ICD-10-CM | POA: Diagnosis not present

## 2021-03-06 DIAGNOSIS — E538 Deficiency of other specified B group vitamins: Secondary | ICD-10-CM | POA: Diagnosis not present

## 2021-03-06 DIAGNOSIS — I639 Cerebral infarction, unspecified: Secondary | ICD-10-CM | POA: Diagnosis not present

## 2021-03-06 DIAGNOSIS — E559 Vitamin D deficiency, unspecified: Secondary | ICD-10-CM | POA: Diagnosis not present

## 2021-03-06 DIAGNOSIS — I10 Essential (primary) hypertension: Secondary | ICD-10-CM | POA: Diagnosis not present

## 2021-03-06 DIAGNOSIS — E785 Hyperlipidemia, unspecified: Secondary | ICD-10-CM | POA: Diagnosis not present

## 2021-03-06 DIAGNOSIS — K5900 Constipation, unspecified: Secondary | ICD-10-CM | POA: Diagnosis not present

## 2021-03-07 DIAGNOSIS — N183 Chronic kidney disease, stage 3 unspecified: Secondary | ICD-10-CM | POA: Diagnosis not present

## 2021-03-07 DIAGNOSIS — E559 Vitamin D deficiency, unspecified: Secondary | ICD-10-CM | POA: Diagnosis not present

## 2021-03-07 DIAGNOSIS — E538 Deficiency of other specified B group vitamins: Secondary | ICD-10-CM | POA: Diagnosis not present

## 2021-03-09 DIAGNOSIS — D510 Vitamin B12 deficiency anemia due to intrinsic factor deficiency: Secondary | ICD-10-CM | POA: Diagnosis not present

## 2021-03-14 DIAGNOSIS — D519 Vitamin B12 deficiency anemia, unspecified: Secondary | ICD-10-CM | POA: Diagnosis not present

## 2021-03-14 DIAGNOSIS — I639 Cerebral infarction, unspecified: Secondary | ICD-10-CM | POA: Diagnosis not present

## 2021-03-14 DIAGNOSIS — I5021 Acute systolic (congestive) heart failure: Secondary | ICD-10-CM | POA: Diagnosis not present

## 2021-03-14 DIAGNOSIS — N182 Chronic kidney disease, stage 2 (mild): Secondary | ICD-10-CM | POA: Diagnosis not present

## 2021-03-14 DIAGNOSIS — I739 Peripheral vascular disease, unspecified: Secondary | ICD-10-CM | POA: Diagnosis not present

## 2021-03-15 DIAGNOSIS — I5021 Acute systolic (congestive) heart failure: Secondary | ICD-10-CM | POA: Diagnosis not present

## 2021-03-25 DIAGNOSIS — G20 Parkinson's disease: Secondary | ICD-10-CM | POA: Diagnosis not present

## 2021-03-25 DIAGNOSIS — I10 Essential (primary) hypertension: Secondary | ICD-10-CM | POA: Diagnosis not present

## 2021-03-25 DIAGNOSIS — E538 Deficiency of other specified B group vitamins: Secondary | ICD-10-CM | POA: Diagnosis not present

## 2021-03-25 DIAGNOSIS — M170 Bilateral primary osteoarthritis of knee: Secondary | ICD-10-CM | POA: Diagnosis not present

## 2021-03-25 DIAGNOSIS — E559 Vitamin D deficiency, unspecified: Secondary | ICD-10-CM | POA: Diagnosis not present

## 2021-03-25 DIAGNOSIS — M8580 Other specified disorders of bone density and structure, unspecified site: Secondary | ICD-10-CM | POA: Diagnosis not present

## 2021-03-25 DIAGNOSIS — R41841 Cognitive communication deficit: Secondary | ICD-10-CM | POA: Diagnosis not present

## 2021-03-25 DIAGNOSIS — Z8616 Personal history of COVID-19: Secondary | ICD-10-CM | POA: Diagnosis not present

## 2021-03-25 DIAGNOSIS — N183 Chronic kidney disease, stage 3 unspecified: Secondary | ICD-10-CM | POA: Diagnosis not present

## 2021-03-25 DIAGNOSIS — K219 Gastro-esophageal reflux disease without esophagitis: Secondary | ICD-10-CM | POA: Diagnosis not present

## 2021-03-25 DIAGNOSIS — M6259 Muscle wasting and atrophy, not elsewhere classified, multiple sites: Secondary | ICD-10-CM | POA: Diagnosis not present

## 2021-03-25 DIAGNOSIS — E785 Hyperlipidemia, unspecified: Secondary | ICD-10-CM | POA: Diagnosis not present

## 2021-03-26 DIAGNOSIS — G20 Parkinson's disease: Secondary | ICD-10-CM | POA: Diagnosis not present

## 2021-03-26 DIAGNOSIS — N183 Chronic kidney disease, stage 3 unspecified: Secondary | ICD-10-CM | POA: Diagnosis not present

## 2021-03-26 DIAGNOSIS — E785 Hyperlipidemia, unspecified: Secondary | ICD-10-CM | POA: Diagnosis not present

## 2021-03-26 DIAGNOSIS — I10 Essential (primary) hypertension: Secondary | ICD-10-CM | POA: Diagnosis not present

## 2021-03-26 DIAGNOSIS — M6259 Muscle wasting and atrophy, not elsewhere classified, multiple sites: Secondary | ICD-10-CM | POA: Diagnosis not present

## 2021-03-26 DIAGNOSIS — K219 Gastro-esophageal reflux disease without esophagitis: Secondary | ICD-10-CM | POA: Diagnosis not present

## 2021-03-27 DIAGNOSIS — N183 Chronic kidney disease, stage 3 unspecified: Secondary | ICD-10-CM | POA: Diagnosis not present

## 2021-03-27 DIAGNOSIS — K219 Gastro-esophageal reflux disease without esophagitis: Secondary | ICD-10-CM | POA: Diagnosis not present

## 2021-03-27 DIAGNOSIS — G20 Parkinson's disease: Secondary | ICD-10-CM | POA: Diagnosis not present

## 2021-03-27 DIAGNOSIS — I10 Essential (primary) hypertension: Secondary | ICD-10-CM | POA: Diagnosis not present

## 2021-03-27 DIAGNOSIS — M6259 Muscle wasting and atrophy, not elsewhere classified, multiple sites: Secondary | ICD-10-CM | POA: Diagnosis not present

## 2021-03-27 DIAGNOSIS — E785 Hyperlipidemia, unspecified: Secondary | ICD-10-CM | POA: Diagnosis not present

## 2021-03-28 DIAGNOSIS — M6259 Muscle wasting and atrophy, not elsewhere classified, multiple sites: Secondary | ICD-10-CM | POA: Diagnosis not present

## 2021-03-28 DIAGNOSIS — N183 Chronic kidney disease, stage 3 unspecified: Secondary | ICD-10-CM | POA: Diagnosis not present

## 2021-03-28 DIAGNOSIS — I10 Essential (primary) hypertension: Secondary | ICD-10-CM | POA: Diagnosis not present

## 2021-03-28 DIAGNOSIS — G20 Parkinson's disease: Secondary | ICD-10-CM | POA: Diagnosis not present

## 2021-03-28 DIAGNOSIS — E785 Hyperlipidemia, unspecified: Secondary | ICD-10-CM | POA: Diagnosis not present

## 2021-03-28 DIAGNOSIS — K219 Gastro-esophageal reflux disease without esophagitis: Secondary | ICD-10-CM | POA: Diagnosis not present

## 2021-03-29 DIAGNOSIS — I10 Essential (primary) hypertension: Secondary | ICD-10-CM | POA: Diagnosis not present

## 2021-03-29 DIAGNOSIS — E785 Hyperlipidemia, unspecified: Secondary | ICD-10-CM | POA: Diagnosis not present

## 2021-03-29 DIAGNOSIS — K219 Gastro-esophageal reflux disease without esophagitis: Secondary | ICD-10-CM | POA: Diagnosis not present

## 2021-03-29 DIAGNOSIS — G20 Parkinson's disease: Secondary | ICD-10-CM | POA: Diagnosis not present

## 2021-03-29 DIAGNOSIS — M6259 Muscle wasting and atrophy, not elsewhere classified, multiple sites: Secondary | ICD-10-CM | POA: Diagnosis not present

## 2021-03-29 DIAGNOSIS — N183 Chronic kidney disease, stage 3 unspecified: Secondary | ICD-10-CM | POA: Diagnosis not present

## 2021-04-01 DIAGNOSIS — G20 Parkinson's disease: Secondary | ICD-10-CM | POA: Diagnosis not present

## 2021-04-01 DIAGNOSIS — K219 Gastro-esophageal reflux disease without esophagitis: Secondary | ICD-10-CM | POA: Diagnosis not present

## 2021-04-01 DIAGNOSIS — N183 Chronic kidney disease, stage 3 unspecified: Secondary | ICD-10-CM | POA: Diagnosis not present

## 2021-04-01 DIAGNOSIS — M6259 Muscle wasting and atrophy, not elsewhere classified, multiple sites: Secondary | ICD-10-CM | POA: Diagnosis not present

## 2021-04-01 DIAGNOSIS — E785 Hyperlipidemia, unspecified: Secondary | ICD-10-CM | POA: Diagnosis not present

## 2021-04-01 DIAGNOSIS — I10 Essential (primary) hypertension: Secondary | ICD-10-CM | POA: Diagnosis not present

## 2021-04-02 DIAGNOSIS — G20 Parkinson's disease: Secondary | ICD-10-CM | POA: Diagnosis not present

## 2021-04-02 DIAGNOSIS — E785 Hyperlipidemia, unspecified: Secondary | ICD-10-CM | POA: Diagnosis not present

## 2021-04-02 DIAGNOSIS — I10 Essential (primary) hypertension: Secondary | ICD-10-CM | POA: Diagnosis not present

## 2021-04-02 DIAGNOSIS — N183 Chronic kidney disease, stage 3 unspecified: Secondary | ICD-10-CM | POA: Diagnosis not present

## 2021-04-02 DIAGNOSIS — M6259 Muscle wasting and atrophy, not elsewhere classified, multiple sites: Secondary | ICD-10-CM | POA: Diagnosis not present

## 2021-04-02 DIAGNOSIS — K219 Gastro-esophageal reflux disease without esophagitis: Secondary | ICD-10-CM | POA: Diagnosis not present

## 2021-04-03 DIAGNOSIS — K219 Gastro-esophageal reflux disease without esophagitis: Secondary | ICD-10-CM | POA: Diagnosis not present

## 2021-04-03 DIAGNOSIS — G20 Parkinson's disease: Secondary | ICD-10-CM | POA: Diagnosis not present

## 2021-04-03 DIAGNOSIS — E785 Hyperlipidemia, unspecified: Secondary | ICD-10-CM | POA: Diagnosis not present

## 2021-04-03 DIAGNOSIS — I10 Essential (primary) hypertension: Secondary | ICD-10-CM | POA: Diagnosis not present

## 2021-04-03 DIAGNOSIS — D519 Vitamin B12 deficiency anemia, unspecified: Secondary | ICD-10-CM | POA: Diagnosis not present

## 2021-04-03 DIAGNOSIS — D649 Anemia, unspecified: Secondary | ICD-10-CM | POA: Diagnosis not present

## 2021-04-03 DIAGNOSIS — N183 Chronic kidney disease, stage 3 unspecified: Secondary | ICD-10-CM | POA: Diagnosis not present

## 2021-04-03 DIAGNOSIS — M6259 Muscle wasting and atrophy, not elsewhere classified, multiple sites: Secondary | ICD-10-CM | POA: Diagnosis not present

## 2021-04-04 DIAGNOSIS — G20 Parkinson's disease: Secondary | ICD-10-CM | POA: Diagnosis not present

## 2021-04-04 DIAGNOSIS — N183 Chronic kidney disease, stage 3 unspecified: Secondary | ICD-10-CM | POA: Diagnosis not present

## 2021-04-04 DIAGNOSIS — I10 Essential (primary) hypertension: Secondary | ICD-10-CM | POA: Diagnosis not present

## 2021-04-04 DIAGNOSIS — K219 Gastro-esophageal reflux disease without esophagitis: Secondary | ICD-10-CM | POA: Diagnosis not present

## 2021-04-04 DIAGNOSIS — E785 Hyperlipidemia, unspecified: Secondary | ICD-10-CM | POA: Diagnosis not present

## 2021-04-04 DIAGNOSIS — M6259 Muscle wasting and atrophy, not elsewhere classified, multiple sites: Secondary | ICD-10-CM | POA: Diagnosis not present

## 2021-04-05 DIAGNOSIS — E785 Hyperlipidemia, unspecified: Secondary | ICD-10-CM | POA: Diagnosis not present

## 2021-04-05 DIAGNOSIS — M6259 Muscle wasting and atrophy, not elsewhere classified, multiple sites: Secondary | ICD-10-CM | POA: Diagnosis not present

## 2021-04-05 DIAGNOSIS — N179 Acute kidney failure, unspecified: Secondary | ICD-10-CM | POA: Diagnosis not present

## 2021-04-05 DIAGNOSIS — K219 Gastro-esophageal reflux disease without esophagitis: Secondary | ICD-10-CM | POA: Diagnosis not present

## 2021-04-05 DIAGNOSIS — N183 Chronic kidney disease, stage 3 unspecified: Secondary | ICD-10-CM | POA: Diagnosis not present

## 2021-04-05 DIAGNOSIS — G20 Parkinson's disease: Secondary | ICD-10-CM | POA: Diagnosis not present

## 2021-04-05 DIAGNOSIS — I10 Essential (primary) hypertension: Secondary | ICD-10-CM | POA: Diagnosis not present

## 2021-04-08 DIAGNOSIS — M6259 Muscle wasting and atrophy, not elsewhere classified, multiple sites: Secondary | ICD-10-CM | POA: Diagnosis not present

## 2021-04-08 DIAGNOSIS — G20 Parkinson's disease: Secondary | ICD-10-CM | POA: Diagnosis not present

## 2021-04-08 DIAGNOSIS — N179 Acute kidney failure, unspecified: Secondary | ICD-10-CM | POA: Diagnosis not present

## 2021-04-08 DIAGNOSIS — I10 Essential (primary) hypertension: Secondary | ICD-10-CM | POA: Diagnosis not present

## 2021-04-08 DIAGNOSIS — I739 Peripheral vascular disease, unspecified: Secondary | ICD-10-CM | POA: Diagnosis not present

## 2021-04-08 DIAGNOSIS — I639 Cerebral infarction, unspecified: Secondary | ICD-10-CM | POA: Diagnosis not present

## 2021-04-08 DIAGNOSIS — D519 Vitamin B12 deficiency anemia, unspecified: Secondary | ICD-10-CM | POA: Diagnosis not present

## 2021-04-08 DIAGNOSIS — E785 Hyperlipidemia, unspecified: Secondary | ICD-10-CM | POA: Diagnosis not present

## 2021-04-08 DIAGNOSIS — K219 Gastro-esophageal reflux disease without esophagitis: Secondary | ICD-10-CM | POA: Diagnosis not present

## 2021-04-08 DIAGNOSIS — N183 Chronic kidney disease, stage 3 unspecified: Secondary | ICD-10-CM | POA: Diagnosis not present

## 2021-04-10 DIAGNOSIS — N179 Acute kidney failure, unspecified: Secondary | ICD-10-CM | POA: Diagnosis not present

## 2021-04-30 DIAGNOSIS — N179 Acute kidney failure, unspecified: Secondary | ICD-10-CM | POA: Diagnosis not present

## 2021-04-30 DIAGNOSIS — D519 Vitamin B12 deficiency anemia, unspecified: Secondary | ICD-10-CM | POA: Diagnosis not present

## 2021-05-03 DIAGNOSIS — R799 Abnormal finding of blood chemistry, unspecified: Secondary | ICD-10-CM | POA: Diagnosis not present

## 2021-05-09 DIAGNOSIS — E785 Hyperlipidemia, unspecified: Secondary | ICD-10-CM | POA: Diagnosis not present

## 2021-05-09 DIAGNOSIS — M818 Other osteoporosis without current pathological fracture: Secondary | ICD-10-CM | POA: Diagnosis not present

## 2021-05-09 DIAGNOSIS — R799 Abnormal finding of blood chemistry, unspecified: Secondary | ICD-10-CM | POA: Diagnosis not present

## 2021-05-09 DIAGNOSIS — I639 Cerebral infarction, unspecified: Secondary | ICD-10-CM | POA: Diagnosis not present

## 2021-05-09 DIAGNOSIS — R627 Adult failure to thrive: Secondary | ICD-10-CM | POA: Diagnosis not present

## 2021-05-11 DIAGNOSIS — R970 Elevated carcinoembryonic antigen [CEA]: Secondary | ICD-10-CM | POA: Diagnosis not present

## 2021-05-13 DIAGNOSIS — E559 Vitamin D deficiency, unspecified: Secondary | ICD-10-CM | POA: Diagnosis not present

## 2021-05-13 DIAGNOSIS — N183 Chronic kidney disease, stage 3 unspecified: Secondary | ICD-10-CM | POA: Diagnosis not present

## 2021-05-17 DIAGNOSIS — R799 Abnormal finding of blood chemistry, unspecified: Secondary | ICD-10-CM | POA: Diagnosis not present

## 2021-05-29 DIAGNOSIS — R441 Visual hallucinations: Secondary | ICD-10-CM | POA: Diagnosis not present

## 2021-05-29 DIAGNOSIS — R4182 Altered mental status, unspecified: Secondary | ICD-10-CM | POA: Diagnosis not present

## 2021-05-30 DIAGNOSIS — N390 Urinary tract infection, site not specified: Secondary | ICD-10-CM | POA: Diagnosis not present

## 2021-05-31 DIAGNOSIS — E538 Deficiency of other specified B group vitamins: Secondary | ICD-10-CM | POA: Diagnosis not present

## 2021-05-31 DIAGNOSIS — K219 Gastro-esophageal reflux disease without esophagitis: Secondary | ICD-10-CM | POA: Diagnosis not present

## 2021-05-31 DIAGNOSIS — Z20822 Contact with and (suspected) exposure to covid-19: Secondary | ICD-10-CM | POA: Diagnosis not present

## 2021-05-31 DIAGNOSIS — D631 Anemia in chronic kidney disease: Secondary | ICD-10-CM | POA: Diagnosis not present

## 2021-05-31 DIAGNOSIS — F02818 Dementia in other diseases classified elsewhere, unspecified severity, with other behavioral disturbance: Secondary | ICD-10-CM | POA: Diagnosis not present

## 2021-05-31 DIAGNOSIS — M8580 Other specified disorders of bone density and structure, unspecified site: Secondary | ICD-10-CM | POA: Diagnosis not present

## 2021-05-31 DIAGNOSIS — E876 Hypokalemia: Secondary | ICD-10-CM | POA: Diagnosis not present

## 2021-05-31 DIAGNOSIS — N183 Chronic kidney disease, stage 3 unspecified: Secondary | ICD-10-CM | POA: Diagnosis not present

## 2021-05-31 DIAGNOSIS — M6259 Muscle wasting and atrophy, not elsewhere classified, multiple sites: Secondary | ICD-10-CM | POA: Diagnosis not present

## 2021-05-31 DIAGNOSIS — M1990 Unspecified osteoarthritis, unspecified site: Secondary | ICD-10-CM | POA: Diagnosis not present

## 2021-05-31 DIAGNOSIS — E785 Hyperlipidemia, unspecified: Secondary | ICD-10-CM | POA: Diagnosis not present

## 2021-05-31 DIAGNOSIS — R001 Bradycardia, unspecified: Secondary | ICD-10-CM | POA: Diagnosis not present

## 2021-05-31 DIAGNOSIS — R442 Other hallucinations: Secondary | ICD-10-CM | POA: Diagnosis not present

## 2021-05-31 DIAGNOSIS — G20 Parkinson's disease: Secondary | ICD-10-CM | POA: Diagnosis not present

## 2021-05-31 DIAGNOSIS — R531 Weakness: Secondary | ICD-10-CM | POA: Diagnosis not present

## 2021-05-31 DIAGNOSIS — Z7982 Long term (current) use of aspirin: Secondary | ICD-10-CM | POA: Diagnosis not present

## 2021-05-31 DIAGNOSIS — I10 Essential (primary) hypertension: Secondary | ICD-10-CM | POA: Diagnosis not present

## 2021-05-31 DIAGNOSIS — E860 Dehydration: Secondary | ICD-10-CM | POA: Diagnosis not present

## 2021-05-31 DIAGNOSIS — Z7902 Long term (current) use of antithrombotics/antiplatelets: Secondary | ICD-10-CM | POA: Diagnosis not present

## 2021-05-31 DIAGNOSIS — D509 Iron deficiency anemia, unspecified: Secondary | ICD-10-CM | POA: Diagnosis not present

## 2021-05-31 DIAGNOSIS — R41841 Cognitive communication deficit: Secondary | ICD-10-CM | POA: Diagnosis not present

## 2021-05-31 DIAGNOSIS — I129 Hypertensive chronic kidney disease with stage 1 through stage 4 chronic kidney disease, or unspecified chronic kidney disease: Secondary | ICD-10-CM | POA: Diagnosis not present

## 2021-05-31 DIAGNOSIS — Z66 Do not resuscitate: Secondary | ICD-10-CM | POA: Diagnosis not present

## 2021-05-31 DIAGNOSIS — M170 Bilateral primary osteoarthritis of knee: Secondary | ICD-10-CM | POA: Diagnosis not present

## 2021-05-31 DIAGNOSIS — G9341 Metabolic encephalopathy: Secondary | ICD-10-CM | POA: Diagnosis not present

## 2021-05-31 DIAGNOSIS — Z79899 Other long term (current) drug therapy: Secondary | ICD-10-CM | POA: Diagnosis not present

## 2021-05-31 DIAGNOSIS — B9620 Unspecified Escherichia coli [E. coli] as the cause of diseases classified elsewhere: Secondary | ICD-10-CM | POA: Diagnosis not present

## 2021-05-31 DIAGNOSIS — E559 Vitamin D deficiency, unspecified: Secondary | ICD-10-CM | POA: Diagnosis not present

## 2021-05-31 DIAGNOSIS — D72829 Elevated white blood cell count, unspecified: Secondary | ICD-10-CM | POA: Diagnosis not present

## 2021-05-31 DIAGNOSIS — Z8616 Personal history of COVID-19: Secondary | ICD-10-CM | POA: Diagnosis not present

## 2021-05-31 DIAGNOSIS — R4182 Altered mental status, unspecified: Secondary | ICD-10-CM | POA: Diagnosis not present

## 2021-05-31 DIAGNOSIS — I69951 Hemiplegia and hemiparesis following unspecified cerebrovascular disease affecting right dominant side: Secondary | ICD-10-CM | POA: Diagnosis not present

## 2021-05-31 DIAGNOSIS — R443 Hallucinations, unspecified: Secondary | ICD-10-CM | POA: Diagnosis not present

## 2021-05-31 DIAGNOSIS — R319 Hematuria, unspecified: Secondary | ICD-10-CM | POA: Diagnosis not present

## 2021-05-31 DIAGNOSIS — N179 Acute kidney failure, unspecified: Secondary | ICD-10-CM | POA: Diagnosis not present

## 2021-05-31 DIAGNOSIS — N390 Urinary tract infection, site not specified: Secondary | ICD-10-CM | POA: Diagnosis not present

## 2021-05-31 DIAGNOSIS — R627 Adult failure to thrive: Secondary | ICD-10-CM | POA: Diagnosis not present

## 2021-06-02 DIAGNOSIS — M8580 Other specified disorders of bone density and structure, unspecified site: Secondary | ICD-10-CM | POA: Diagnosis not present

## 2021-06-02 DIAGNOSIS — R531 Weakness: Secondary | ICD-10-CM | POA: Diagnosis not present

## 2021-06-02 DIAGNOSIS — I639 Cerebral infarction, unspecified: Secondary | ICD-10-CM | POA: Diagnosis not present

## 2021-06-02 DIAGNOSIS — R001 Bradycardia, unspecified: Secondary | ICD-10-CM | POA: Diagnosis not present

## 2021-06-02 DIAGNOSIS — N390 Urinary tract infection, site not specified: Secondary | ICD-10-CM | POA: Diagnosis not present

## 2021-06-02 DIAGNOSIS — I69951 Hemiplegia and hemiparesis following unspecified cerebrovascular disease affecting right dominant side: Secondary | ICD-10-CM | POA: Diagnosis not present

## 2021-06-02 DIAGNOSIS — D509 Iron deficiency anemia, unspecified: Secondary | ICD-10-CM | POA: Diagnosis not present

## 2021-06-02 DIAGNOSIS — E559 Vitamin D deficiency, unspecified: Secondary | ICD-10-CM | POA: Diagnosis not present

## 2021-06-02 DIAGNOSIS — R627 Adult failure to thrive: Secondary | ICD-10-CM | POA: Diagnosis not present

## 2021-06-02 DIAGNOSIS — Z8616 Personal history of COVID-19: Secondary | ICD-10-CM | POA: Diagnosis not present

## 2021-06-02 DIAGNOSIS — E538 Deficiency of other specified B group vitamins: Secondary | ICD-10-CM | POA: Diagnosis not present

## 2021-06-02 DIAGNOSIS — R41841 Cognitive communication deficit: Secondary | ICD-10-CM | POA: Diagnosis not present

## 2021-06-02 DIAGNOSIS — N179 Acute kidney failure, unspecified: Secondary | ICD-10-CM | POA: Diagnosis not present

## 2021-06-02 DIAGNOSIS — E785 Hyperlipidemia, unspecified: Secondary | ICD-10-CM | POA: Diagnosis not present

## 2021-06-02 DIAGNOSIS — M170 Bilateral primary osteoarthritis of knee: Secondary | ICD-10-CM | POA: Diagnosis not present

## 2021-06-02 DIAGNOSIS — I10 Essential (primary) hypertension: Secondary | ICD-10-CM | POA: Diagnosis not present

## 2021-06-02 DIAGNOSIS — G20 Parkinson's disease: Secondary | ICD-10-CM | POA: Diagnosis not present

## 2021-06-02 DIAGNOSIS — K219 Gastro-esophageal reflux disease without esophagitis: Secondary | ICD-10-CM | POA: Diagnosis not present

## 2021-06-02 DIAGNOSIS — N183 Chronic kidney disease, stage 3 unspecified: Secondary | ICD-10-CM | POA: Diagnosis not present

## 2021-06-02 DIAGNOSIS — M6259 Muscle wasting and atrophy, not elsewhere classified, multiple sites: Secondary | ICD-10-CM | POA: Diagnosis not present

## 2021-06-02 DIAGNOSIS — G9341 Metabolic encephalopathy: Secondary | ICD-10-CM | POA: Diagnosis not present

## 2021-06-06 DIAGNOSIS — I639 Cerebral infarction, unspecified: Secondary | ICD-10-CM | POA: Diagnosis not present

## 2021-06-06 DIAGNOSIS — D509 Iron deficiency anemia, unspecified: Secondary | ICD-10-CM | POA: Diagnosis not present

## 2021-06-06 DIAGNOSIS — N390 Urinary tract infection, site not specified: Secondary | ICD-10-CM | POA: Diagnosis not present

## 2021-06-06 DIAGNOSIS — N179 Acute kidney failure, unspecified: Secondary | ICD-10-CM | POA: Diagnosis not present

## 2021-06-24 DIAGNOSIS — Z79899 Other long term (current) drug therapy: Secondary | ICD-10-CM | POA: Diagnosis not present

## 2021-06-24 DIAGNOSIS — N183 Chronic kidney disease, stage 3 unspecified: Secondary | ICD-10-CM | POA: Diagnosis not present

## 2021-06-24 DIAGNOSIS — G20 Parkinson's disease: Secondary | ICD-10-CM | POA: Diagnosis not present

## 2021-06-24 DIAGNOSIS — I10 Essential (primary) hypertension: Secondary | ICD-10-CM | POA: Diagnosis not present

## 2021-06-24 DIAGNOSIS — E785 Hyperlipidemia, unspecified: Secondary | ICD-10-CM | POA: Diagnosis not present

## 2021-07-02 DIAGNOSIS — I639 Cerebral infarction, unspecified: Secondary | ICD-10-CM | POA: Diagnosis not present

## 2021-07-02 DIAGNOSIS — N179 Acute kidney failure, unspecified: Secondary | ICD-10-CM | POA: Diagnosis not present

## 2021-07-02 DIAGNOSIS — D509 Iron deficiency anemia, unspecified: Secondary | ICD-10-CM | POA: Diagnosis not present

## 2021-07-02 DIAGNOSIS — R627 Adult failure to thrive: Secondary | ICD-10-CM | POA: Diagnosis not present

## 2021-07-06 DIAGNOSIS — N390 Urinary tract infection, site not specified: Secondary | ICD-10-CM | POA: Diagnosis not present

## 2021-07-13 DIAGNOSIS — K5900 Constipation, unspecified: Secondary | ICD-10-CM | POA: Diagnosis not present

## 2021-07-15 ENCOUNTER — Encounter (HOSPITAL_COMMUNITY): Payer: Self-pay

## 2021-07-15 ENCOUNTER — Inpatient Hospital Stay (HOSPITAL_COMMUNITY): Payer: Medicare Other

## 2021-07-15 ENCOUNTER — Other Ambulatory Visit: Payer: Self-pay

## 2021-07-15 ENCOUNTER — Inpatient Hospital Stay (HOSPITAL_COMMUNITY)
Admission: EM | Admit: 2021-07-15 | Discharge: 2021-07-17 | DRG: 065 | Disposition: A | Discharge status: Skilled Nursing Facility | Payer: Medicare Other | Source: Skilled Nursing Facility | Attending: Family Medicine | Admitting: Family Medicine

## 2021-07-15 ENCOUNTER — Emergency Department (HOSPITAL_COMMUNITY): Payer: Medicare Other

## 2021-07-15 DIAGNOSIS — J90 Pleural effusion, not elsewhere classified: Secondary | ICD-10-CM | POA: Diagnosis not present

## 2021-07-15 DIAGNOSIS — G8191 Hemiplegia, unspecified affecting right dominant side: Secondary | ICD-10-CM | POA: Diagnosis present

## 2021-07-15 DIAGNOSIS — K5900 Constipation, unspecified: Secondary | ICD-10-CM | POA: Diagnosis not present

## 2021-07-15 DIAGNOSIS — Z8616 Personal history of COVID-19: Secondary | ICD-10-CM | POA: Diagnosis not present

## 2021-07-15 DIAGNOSIS — R531 Weakness: Secondary | ICD-10-CM | POA: Diagnosis not present

## 2021-07-15 DIAGNOSIS — E785 Hyperlipidemia, unspecified: Secondary | ICD-10-CM | POA: Diagnosis present

## 2021-07-15 DIAGNOSIS — G20 Parkinson's disease: Secondary | ICD-10-CM | POA: Diagnosis present

## 2021-07-15 DIAGNOSIS — I639 Cerebral infarction, unspecified: Secondary | ICD-10-CM | POA: Diagnosis not present

## 2021-07-15 DIAGNOSIS — R1310 Dysphagia, unspecified: Secondary | ICD-10-CM | POA: Diagnosis not present

## 2021-07-15 DIAGNOSIS — R001 Bradycardia, unspecified: Secondary | ICD-10-CM | POA: Diagnosis present

## 2021-07-15 DIAGNOSIS — R4701 Aphasia: Secondary | ICD-10-CM | POA: Diagnosis present

## 2021-07-15 DIAGNOSIS — Z7982 Long term (current) use of aspirin: Secondary | ICD-10-CM | POA: Diagnosis not present

## 2021-07-15 DIAGNOSIS — E890 Postprocedural hypothyroidism: Secondary | ICD-10-CM | POA: Diagnosis present

## 2021-07-15 DIAGNOSIS — I63522 Cerebral infarction due to unspecified occlusion or stenosis of left anterior cerebral artery: Secondary | ICD-10-CM | POA: Diagnosis present

## 2021-07-15 DIAGNOSIS — D75839 Thrombocytosis, unspecified: Secondary | ICD-10-CM

## 2021-07-15 DIAGNOSIS — Z79899 Other long term (current) drug therapy: Secondary | ICD-10-CM | POA: Diagnosis not present

## 2021-07-15 DIAGNOSIS — Z7401 Bed confinement status: Secondary | ICD-10-CM

## 2021-07-15 DIAGNOSIS — R29724 NIHSS score 24: Secondary | ICD-10-CM | POA: Diagnosis present

## 2021-07-15 DIAGNOSIS — R404 Transient alteration of awareness: Secondary | ICD-10-CM | POA: Diagnosis not present

## 2021-07-15 DIAGNOSIS — I959 Hypotension, unspecified: Secondary | ICD-10-CM | POA: Diagnosis not present

## 2021-07-15 DIAGNOSIS — Z66 Do not resuscitate: Secondary | ICD-10-CM | POA: Diagnosis present

## 2021-07-15 DIAGNOSIS — I63233 Cerebral infarction due to unspecified occlusion or stenosis of bilateral carotid arteries: Secondary | ICD-10-CM | POA: Diagnosis not present

## 2021-07-15 DIAGNOSIS — I6522 Occlusion and stenosis of left carotid artery: Secondary | ICD-10-CM | POA: Diagnosis present

## 2021-07-15 DIAGNOSIS — R29810 Facial weakness: Secondary | ICD-10-CM | POA: Diagnosis not present

## 2021-07-15 DIAGNOSIS — K8020 Calculus of gallbladder without cholecystitis without obstruction: Secondary | ICD-10-CM | POA: Diagnosis not present

## 2021-07-15 DIAGNOSIS — Z7902 Long term (current) use of antithrombotics/antiplatelets: Secondary | ICD-10-CM | POA: Diagnosis not present

## 2021-07-15 DIAGNOSIS — N1831 Chronic kidney disease, stage 3a: Secondary | ICD-10-CM | POA: Diagnosis present

## 2021-07-15 DIAGNOSIS — Z8673 Personal history of transient ischemic attack (TIA), and cerebral infarction without residual deficits: Secondary | ICD-10-CM

## 2021-07-15 DIAGNOSIS — Z888 Allergy status to other drugs, medicaments and biological substances status: Secondary | ICD-10-CM | POA: Diagnosis not present

## 2021-07-15 DIAGNOSIS — N179 Acute kidney failure, unspecified: Secondary | ICD-10-CM | POA: Diagnosis present

## 2021-07-15 DIAGNOSIS — I10 Essential (primary) hypertension: Secondary | ICD-10-CM | POA: Diagnosis not present

## 2021-07-15 DIAGNOSIS — D509 Iron deficiency anemia, unspecified: Secondary | ICD-10-CM | POA: Diagnosis not present

## 2021-07-15 DIAGNOSIS — I129 Hypertensive chronic kidney disease with stage 1 through stage 4 chronic kidney disease, or unspecified chronic kidney disease: Secondary | ICD-10-CM | POA: Diagnosis present

## 2021-07-15 DIAGNOSIS — H9190 Unspecified hearing loss, unspecified ear: Secondary | ICD-10-CM | POA: Diagnosis present

## 2021-07-15 DIAGNOSIS — Z515 Encounter for palliative care: Secondary | ICD-10-CM

## 2021-07-15 HISTORY — DX: Thrombocytosis, unspecified: D75.839

## 2021-07-15 HISTORY — DX: Iron deficiency anemia, unspecified: D50.9

## 2021-07-15 HISTORY — DX: Bradycardia, unspecified: R00.1

## 2021-07-15 HISTORY — DX: Cerebral infarction due to unspecified occlusion or stenosis of left anterior cerebral artery: I63.522

## 2021-07-15 HISTORY — DX: Dysphagia, unspecified: R13.10

## 2021-07-15 LAB — ECHOCARDIOGRAM COMPLETE
AR max vel: 0.93 cm2
AV Area VTI: 0.99 cm2
AV Mean grad: 45.4 mmHg
AV Peak grad: 78.8 mmHg
Ao pk vel: 4.44 m/s
Area-P 1/2: 2.38 cm2
MV M vel: 6.69 m/s
MV Peak grad: 179 mmHg
P 1/2 time: 596 msec
S' Lateral: 2.9 cm
Weight: 1728.41 oz

## 2021-07-15 LAB — COMPREHENSIVE METABOLIC PANEL
ALT: 11 U/L (ref 0–44)
AST: 14 U/L — ABNORMAL LOW (ref 15–41)
Albumin: 2.4 g/dL — ABNORMAL LOW (ref 3.5–5.0)
Alkaline Phosphatase: 88 U/L (ref 38–126)
Anion gap: 10 (ref 5–15)
BUN: 46 mg/dL — ABNORMAL HIGH (ref 8–23)
CO2: 23 mmol/L (ref 22–32)
Calcium: 9.2 mg/dL (ref 8.9–10.3)
Chloride: 105 mmol/L (ref 98–111)
Creatinine, Ser: 2.18 mg/dL — ABNORMAL HIGH (ref 0.44–1.00)
GFR, Estimated: 21 mL/min — ABNORMAL LOW (ref >60)
Glucose, Bld: 128 mg/dL — ABNORMAL HIGH (ref 70–99)
Potassium: 3.9 mmol/L (ref 3.5–5.1)
Sodium: 138 mmol/L (ref 135–145)
Total Bilirubin: 0.6 mg/dL (ref 0.3–1.2)
Total Protein: 7 g/dL (ref 6.5–8.1)

## 2021-07-15 LAB — I-STAT CHEM 8, ED
BUN: 66 mg/dL — ABNORMAL HIGH (ref 8–23)
Calcium, Ion: 1.04 mmol/L — ABNORMAL LOW (ref 1.15–1.40)
Chloride: 105 mmol/L (ref 98–111)
Creatinine, Ser: 2.3 mg/dL — ABNORMAL HIGH (ref 0.44–1.00)
Glucose, Bld: 107 mg/dL — ABNORMAL HIGH (ref 70–99)
HCT: 28 % — ABNORMAL LOW (ref 36.0–46.0)
Hemoglobin: 9.5 g/dL — ABNORMAL LOW (ref 12.0–15.0)
Potassium: 7.2 mmol/L (ref 3.5–5.1)
Sodium: 135 mmol/L (ref 135–145)
TCO2: 25 mmol/L (ref 22–32)

## 2021-07-15 LAB — APTT: aPTT: 30 seconds (ref 24–36)

## 2021-07-15 LAB — HEMOGLOBIN A1C
Hgb A1c MFr Bld: 5.3 % (ref 4.8–5.6)
Mean Plasma Glucose: 105.41 mg/dL

## 2021-07-15 LAB — LIPID PANEL
Cholesterol: 151 mg/dL (ref 0–200)
HDL: 30 mg/dL — ABNORMAL LOW (ref >40)
LDL Cholesterol: 78 mg/dL (ref 0–99)
Total CHOL/HDL Ratio: 5 RATIO
Triglycerides: 215 mg/dL — ABNORMAL HIGH (ref <150)
VLDL: 43 mg/dL — ABNORMAL HIGH (ref 0–40)

## 2021-07-15 LAB — DIFFERENTIAL
Abs Immature Granulocytes: 0.05 10*3/uL (ref 0.00–0.07)
Basophils Absolute: 0 10*3/uL (ref 0.0–0.1)
Basophils Relative: 1 %
Eosinophils Absolute: 0.1 10*3/uL (ref 0.0–0.5)
Eosinophils Relative: 1 %
Immature Granulocytes: 1 %
Lymphocytes Relative: 17 %
Lymphs Abs: 1.4 10*3/uL (ref 0.7–4.0)
Monocytes Absolute: 0.5 10*3/uL (ref 0.1–1.0)
Monocytes Relative: 5 %
Neutro Abs: 6.5 10*3/uL (ref 1.7–7.7)
Neutrophils Relative %: 75 %

## 2021-07-15 LAB — CBC
HCT: 30.9 % — ABNORMAL LOW (ref 36.0–46.0)
Hemoglobin: 9.3 g/dL — ABNORMAL LOW (ref 12.0–15.0)
MCH: 29 pg (ref 26.0–34.0)
MCHC: 30.1 g/dL (ref 30.0–36.0)
MCV: 96.3 fL (ref 80.0–100.0)
Platelets: 402 10*3/uL — ABNORMAL HIGH (ref 150–400)
RBC: 3.21 MIL/uL — ABNORMAL LOW (ref 3.87–5.11)
RDW: 16.8 % — ABNORMAL HIGH (ref 11.5–15.5)
WBC: 8.6 10*3/uL (ref 4.0–10.5)
nRBC: 0 % (ref 0.0–0.2)

## 2021-07-15 LAB — CBG MONITORING, ED: Glucose-Capillary: 108 mg/dL — ABNORMAL HIGH (ref 70–99)

## 2021-07-15 LAB — PROTIME-INR
INR: 1.1 (ref 0.8–1.2)
Prothrombin Time: 13.8 seconds (ref 11.4–15.2)

## 2021-07-15 MED ORDER — ACETAMINOPHEN 160 MG/5ML PO SOLN: 650.0000 mg | ORAL | Status: DC | PRN

## 2021-07-15 MED ORDER — ZINC OXIDE 12.8 % EX OINT
1.0000 "application " | TOPICAL_OINTMENT | Freq: Three times a day (TID) | CUTANEOUS | Status: DC | PRN
  Filled 2021-07-15: qty 56.7

## 2021-07-15 MED ORDER — ACETAMINOPHEN 325 MG PO TABS: 650.0000 mg | ORAL_TABLET | ORAL | Status: DC | PRN

## 2021-07-15 MED ORDER — SENNOSIDES-DOCUSATE SODIUM 8.6-50 MG PO TABS
1.0000 | ORAL_TABLET | Freq: Two times a day (BID) | ORAL | Status: DC
  Administered 2021-07-15 – 2021-07-17 (×5): 1 via ORAL
  Filled 2021-07-15 (×5): qty 1

## 2021-07-15 MED ORDER — PANTOPRAZOLE SODIUM 40 MG PO TBEC
40.0000 mg | DELAYED_RELEASE_TABLET | Freq: Every morning | ORAL | Status: DC
  Administered 2021-07-16 – 2021-07-17 (×2): 40 mg via ORAL
  Filled 2021-07-15 (×2): qty 1

## 2021-07-15 MED ORDER — ONDANSETRON HCL 4 MG/2ML IJ SOLN
INTRAMUSCULAR | Status: AC
  Administered 2021-07-15: 4 mg via INTRAVENOUS
  Filled 2021-07-15: qty 2

## 2021-07-15 MED ORDER — MAGNESIUM OXIDE -MG SUPPLEMENT 400 (240 MG) MG PO TABS
400.0000 mg | ORAL_TABLET | Freq: Every morning | ORAL | Status: DC
  Administered 2021-07-16 – 2021-07-17 (×2): 400 mg via ORAL
  Filled 2021-07-15 (×2): qty 1

## 2021-07-15 MED ORDER — CLOPIDOGREL BISULFATE 300 MG PO TABS
300.0000 mg | ORAL_TABLET | Freq: Once | ORAL | Status: AC
  Administered 2021-07-15: 300 mg via ORAL
  Filled 2021-07-15: qty 1

## 2021-07-15 MED ORDER — ENSURE MAX PROTEIN PO LIQD
1.0000 | Freq: Two times a day (BID) | ORAL | Status: DC
  Administered 2021-07-16 (×2): 330 mL via ORAL
  Filled 2021-07-15 (×5): qty 330

## 2021-07-15 MED ORDER — HYDRALAZINE HCL 20 MG/ML IJ SOLN: 5.0000 mg | INTRAMUSCULAR | Status: DC | PRN

## 2021-07-15 MED ORDER — HEPARIN SODIUM (PORCINE) 5000 UNIT/ML IJ SOLN
5000.0000 [IU] | Freq: Three times a day (TID) | INTRAMUSCULAR | Status: DC
  Administered 2021-07-15 – 2021-07-17 (×6): 5000 [IU] via SUBCUTANEOUS
  Filled 2021-07-15 (×6): qty 1

## 2021-07-15 MED ORDER — SODIUM CHLORIDE 0.9% FLUSH
3.0000 mL | Freq: Once | INTRAVENOUS | Status: AC
  Administered 2021-07-15: 3 mL via INTRAVENOUS

## 2021-07-15 MED ORDER — ACETAMINOPHEN 650 MG RE SUPP: 650.0000 mg | RECTAL | Status: DC | PRN

## 2021-07-15 MED ORDER — POLYETHYLENE GLYCOL 3350 17 G PO PACK
17.0000 g | PACK | Freq: Every day | ORAL | Status: DC | PRN
  Administered 2021-07-16: 17 g via ORAL
  Filled 2021-07-15: qty 1

## 2021-07-15 MED ORDER — ONDANSETRON HCL 4 MG/2ML IJ SOLN: 4.0000 mg | Freq: Once | INTRAMUSCULAR | Status: AC

## 2021-07-15 MED ORDER — STROKE: EARLY STAGES OF RECOVERY BOOK
Freq: Once | Status: AC
  Filled 2021-07-15: qty 1

## 2021-07-15 MED ORDER — SODIUM CHLORIDE 0.9 % IV SOLN: INTRAVENOUS | Status: DC

## 2021-07-15 MED ORDER — MIRTAZAPINE 15 MG PO TABS
7.5000 mg | ORAL_TABLET | Freq: Every day | ORAL | Status: DC
  Administered 2021-07-15 – 2021-07-16 (×2): 7.5 mg via ORAL
  Filled 2021-07-15 (×3): qty 1

## 2021-07-15 MED ORDER — ROSUVASTATIN CALCIUM 5 MG PO TABS
5.0000 mg | ORAL_TABLET | Freq: Every morning | ORAL | Status: DC
  Administered 2021-07-16 – 2021-07-17 (×2): 5 mg via ORAL
  Filled 2021-07-15 (×2): qty 1

## 2021-07-15 MED ORDER — ASPIRIN 81 MG PO CHEW
81.0000 mg | CHEWABLE_TABLET | Freq: Every day | ORAL | Status: DC
  Administered 2021-07-15 – 2021-07-17 (×3): 81 mg via ORAL
  Filled 2021-07-15 (×3): qty 1

## 2021-07-15 MED ORDER — SORBITOL 70 % SOLN
300.0000 mL | TOPICAL_OIL | Freq: Once | ORAL | Status: AC
  Administered 2021-07-16: 300 mL via RECTAL
  Filled 2021-07-15: qty 90

## 2021-07-15 MED ORDER — CLOPIDOGREL BISULFATE 75 MG PO TABS
75.0000 mg | ORAL_TABLET | Freq: Every day | ORAL | Status: DC
  Administered 2021-07-16: 75 mg via ORAL
  Filled 2021-07-15: qty 1

## 2021-07-15 NOTE — ED Notes (Signed)
Patient transported to MRI 

## 2021-07-15 NOTE — ED Notes (Signed)
Mri to transport patient upstairs when pt is done with MRI.

## 2021-07-15 NOTE — ED Notes (Signed)
ED TO INPATIENT HANDOFF REPORT  ED Nurse Name and Phone #: Bridgit Eynon RN [phone]  S Name/Age/Gender Maria Hanna 88 y.o. female Room/Bed: 010C/010C  Code Status   Code Status: DNR  Home/SNF/Other Nursing Home Patient alert but not oriented.  Is this baseline? No   Triage Complete: Triage complete  Chief Complaint CVA (cerebral vascular accident) [facility]) [I63.9]  Triage Note Pt BIB [facility] for a code stroke from [employer] and rehab in [place]. Staff gave her meds at [facility] and she was normal. Staff came back to check on pt around 10am and she was not talking and had right sided weakness and right sided facial droop. Pt has a 18g LAC.    Allergies Allergies  Allergen Reactions   Atorvastatin Other (See Comments)    Muscle pain    Level of Care/Admitting Diagnosis ED Disposition     ED Disposition  Admit   Condition  --   Comment  Hospital Area: [facility] [[id number]]  Level of Care: Telemetry Medical [104]  May admit patient to [facility] or [facility] if equivalent level of care is available:: No  Covid Evaluation: Asymptomatic - no recent exposure (last 10 days) testing not required  Diagnosis: CVA (cerebral vascular accident) [facility]) [[id number]]  Admitting Physician: Clydie Braun [[id number]]  Attending Physician: Clydie Braun [[id number]]  Estimated length of stay: past midnight tomorrow  Certification:: I certify this patient will need inpatient services for at least 2 midnights          B Medical/Surgery History Past Medical History:  Diagnosis Date   Carotid disease, bilateral (HCC)    Chronic kidney disease    Hyperlipidemia    Hypertension    Stroke [facility])    Past Surgical History:  Procedure Laterality Date   CATARACT EXTRACTION  7/97,10/97   NM MYOCAR PERF WALL MOTION  12/11/2008   negative for ischemia. Low risk scan.   THYROIDECTOMY  1959   US ECHOCARDIOGRAPHY  05/09/2010   mild ca+ of the AOV leaflets,AOV mildly  sclerotic,aortic root sclerosis.     A IV Location/Drains/Wounds Patient Lines/Drains/Airways Status     Active Line/Drains/Airways     Name Placement date Placement time Site Days   Peripheral IV 07/15/21 18 G Left Antecubital 07/15/21  1100  Antecubital  less than 1            Intake/Output Last 24 hours No intake or output data in the 24 hours ending 07/15/21 1608  Labs/Imaging Results for orders placed or performed during the hospital encounter of 07/15/21 (from the past 48 hour(s))  Protime-INR     Status: None   Collection Time: 07/15/21 11:00 AM  Result Value Ref Range   Prothrombin Time 13.8 11.4 - 15.2 seconds   INR 1.1 0.8 - 1.2    Comment: (NOTE) INR goal varies based on device and disease states. Performed at [facility], [address] [place], [place] [address]   APTT     Status: None   Collection Time: 07/15/21 11:00 AM  Result Value Ref Range   aPTT 30 24 - 36 seconds    Comment: Performed at [facility], [address] [place], [place] [address]  CBC     Status: Abnormal   Collection Time: 07/15/21 11:00 AM  Result Value Ref Range   WBC 8.6 4.0 - 10.5 K/uL   RBC 3.21 (L) 3.87 - 5.11 MIL/uL   Hemoglobin 9.3 (L) 12.0 -  15.0 g/dL   HCT 16.130.9 (L) 09.636.0 - 04.546.0 %   MCV 96.3 80.0 - 100.0 fL   MCH 29.0 26.0 - 34.0 pg   MCHC 30.1 30.0 - 36.0 g/dL   RDW 40.916.8 (H) 81.111.5 - 91.415.5 %   Platelets 402 (H) 150 - 400 K/uL   nRBC 0.0 0.0 - 0.2 %    Comment: Performed at [facility], [address] [place], [place] [address]  Differential     Status: None   Collection Time: 07/15/21 11:00 AM  Result Value Ref Range   Neutrophils Relative % 75 %   Neutro Abs 6.5 1.7 - 7.7 K/uL   Lymphocytes Relative 17 %   Lymphs Abs 1.4 0.7 - 4.0 K/uL   Monocytes Relative 5 %   Monocytes Absolute 0.5 0.1 - 1.0 K/uL   Eosinophils Relative 1 %   Eosinophils Absolute 0.1 0.0 - 0.5 K/uL   Basophils Relative 1 %   Basophils Absolute 0.0 0.0 - 0.1 K/uL    Immature Granulocytes 1 %   Abs Immature Granulocytes 0.05 0.00 - 0.07 K/uL    Comment: Performed at [facility], [address] [place], [place] [address]  Hemoglobin A1c     Status: None   Collection Time: 07/15/21 11:00 AM  Result Value Ref Range   Hgb A1c MFr Bld 5.3 4.8 - 5.6 %    Comment: (NOTE) Pre diabetes:          5.7%-6.4%  Diabetes:              >6.4%  Glycemic control for   <7.0% adults with diabetes    Mean Plasma Glucose 105.41 mg/dL    Comment: Performed at [facility], [address] [place], [place] [address]  CBG monitoring, ED     Status: Abnormal   Collection Time: 07/15/21 11:03 AM  Result Value Ref Range   Glucose-Capillary 108 (H) 70 - 99 mg/dL    Comment: Glucose reference range applies only to samples taken after fasting for at least 8 hours.  I-stat chem 8, ED     Status: Abnormal   Collection Time: 07/15/21 11:10 AM  Result Value Ref Range   Sodium 135 135 - 145 mmol/L   Potassium 7.2 (HH) 3.5 - 5.1 mmol/L   Chloride 105 98 - 111 mmol/L   BUN 66 (H) 8 - 23 mg/dL   Creatinine, Ser 4.692.30 (H) 0.44 - 1.00 mg/dL   Glucose, Bld 629107 (H) 70 - 99 mg/dL    Comment: Glucose reference range applies only to samples taken after fasting for at least 8 hours.   Calcium, Ion 1.04 (L) 1.15 - 1.40 mmol/L   TCO2 25 22 - 32 mmol/L   Hemoglobin 9.5 (L) 12.0 - 15.0 g/dL   HCT 52.828.0 (L) 41.336.0 - 24.446.0 %   Comment NOTIFIED PHYSICIAN   Comprehensive metabolic panel     Status: Abnormal   Collection Time: 07/15/21 12:35 PM  Result Value Ref Range   Sodium 138 135 - 145 mmol/L   Potassium 3.9 3.5 - 5.1 mmol/L   Chloride 105 98 - 111 mmol/L   CO2 23 22 - 32 mmol/L   Glucose, Bld 128 (H) 70 - 99 mg/dL    Comment: Glucose reference range applies only to samples taken after fasting for at least 8 hours.   BUN 46 (H) 8 - 23 mg/dL   Creatinine, Ser 0.102.18 (H) 0.44 - 1.00 mg/dL  Calcium 9.2 8.9 - 10.3 mg/dL   Total Protein 7.0 6.5 - 8.1 g/dL   Albumin 2.4 (L) 3.5  - 5.0 g/dL   AST 14 (L) 15 - 41 U/L   ALT 11 0 - 44 U/L   Alkaline Phosphatase 88 38 - 126 U/L   Total Bilirubin 0.6 0.3 - 1.2 mg/dL   GFR, Estimated 21 (L) >60 mL/min    Comment: (NOTE) Calculated using the CKD-EPI Creatinine Equation (2021)    Anion gap 10 5 - 15    Comment: Performed at [facility], [address] [place], [place] [address]  Lipid panel     Status: Abnormal   Collection Time: 07/15/21 12:35 PM  Result Value Ref Range   Cholesterol 151 0 - 200 mg/dL   Triglycerides 403 (H) <150 mg/dL   HDL 30 (L) >47 mg/dL   Total CHOL/HDL Ratio 5.0 RATIO   VLDL 43 (H) 0 - 40 mg/dL   LDL Cholesterol 78 0 - 99 mg/dL    Comment:        Total Cholesterol/HDL:CHD Risk Coronary Heart Disease Risk Table                     Men   Women  1/2 Average Risk   3.4   3.3  Average Risk       5.0   4.4  2 X Average Risk   9.6   7.1  3 X Average Risk  23.4   11.0        Use the calculated Patient Ratio above and the CHD Risk Table to determine the patient's CHD Risk.        ATP III CLASSIFICATION (LDL):  <100     mg/dL   Optimal  425-956  mg/dL   Near or Above                    Optimal  130-159  mg/dL   Borderline  387-564  mg/dL   High  >332     mg/dL   Very High Performed at [facility], [address] [place], [place] [address]    CT HEAD CODE STROKE WO CONTRAST  Result Date: 07/15/2021 CLINICAL DATA:  Code stroke. Neuro deficit, acute, stroke suspected. Right-sided weakness and facial droop. Last known well 6 o'clock a.m. today. EXAM: CT HEAD WITHOUT CONTRAST TECHNIQUE: Contiguous axial images were obtained from the base of the skull through the vertex without intravenous contrast. RADIATION DOSE REDUCTION: This exam was performed according to the departmental dose-optimization program which includes automated exposure control, adjustment of the mA and/or kV according to patient size and/or use of iterative reconstruction technique. COMPARISON:  CT head without  contrast 05/31/2021 at [facility]. FINDINGS: Brain: Acute nonhemorrhagic infarct is present the left ACA territory with loss of gray-white differentiation in some effacement of the sulci. MCA territory is unremarkable. Basal ganglia are intact. Insular ribbon is normal. No acute or focal right hemisphere lesions are present. The brainstem and cerebellum are within normal limits. Vascular: Atherosclerotic calcifications are present within the cavernous internal carotid arteries bilaterally and the dural margin of the left vertebral artery. Proximal left A3 hyperdensity suspected. Correlate with CTA. Atherosclerotic calcifications are also present at the Skull: Calvarium is intact. No focal lytic or blastic lesions are present. No significant extracranial soft tissue lesion is present. Sinuses/Orbits: The paranasal sinuses and mastoid air cells are clear. Bilateral lens replacements are noted. Globes and  orbits are otherwise unremarkable. ASPECTS [facility] Stroke Program Early CT Score) - Ganglionic level infarction (caudate, lentiform nuclei, internal capsule, insula, M1-M3 cortex): 7/7 - Supraganglionic infarction (M4-M6 cortex): 3/3 Total score (0-10 with 10 being normal): 10/10 IMPRESSION: 1. Acute nonhemorrhagic infarct involving the left ACA territory. 2. Question hyperdensity of the left pericallosal artery at its origin. 3. ASPECTS is 10/10. The above was relayed via text pager to Dr. Amada Jupiter on 07/15/2021 at 11:18 . Electronically Signed   By: Marin Roberts M.D.   On: 07/15/2021 11:18    Pending Labs Unresulted Labs (From admission, onward)     Start     Ordered   07/15/21 1500  Urinalysis, Routine w reflex microscopic  Once,   R        07/15/21 1459            Vitals/Pain Today's Vitals   07/15/21 1445 07/15/21 1500 07/15/21 1515 07/15/21 1530  BP: (!) 159/39 (!) 155/42 (!) 161/36 (!) 164/37  Pulse: (!) 52 (!) 51 (!) 50 (!) 55  Resp: 18 17 18 15   Temp:      TempSrc:       SpO2: 96% 96% 95% 96%  Weight:        Isolation Precautions No active isolations  Medications Medications  0.9 %  sodium chloride infusion ( Intravenous New Bag/Given 07/15/21 1444)  acetaminophen (TYLENOL) tablet 650 mg (has no administration in time range)    Or  acetaminophen (TYLENOL) 160 MG/5ML solution 650 mg (has no administration in time range)    Or  acetaminophen (TYLENOL) suppository 650 mg (has no administration in time range)  heparin injection 5,000 Units (5,000 Units Subcutaneous Given 07/15/21 1442)  aspirin chewable tablet 81 mg (81 mg Oral Given 07/15/21 1551)  clopidogrel (PLAVIX) tablet 75 mg (has no administration in time range)  sorbitol, milk of mag, mineral oil, glycerin (SMOG) enema (has no administration in time range)  senna-docusate (Senokot-S) tablet 1 tablet (1 tablet Oral Given 07/15/21 1549)  hydrALAZINE (APRESOLINE) injection 5 mg (has no administration in time range)  sodium chloride flush (NS) 0.9 % injection 3 mL (3 mLs Intravenous Given 07/15/21 1139)  ondansetron (ZOFRAN) injection 4 mg (4 mg Intravenous Given 07/15/21 1233)   stroke: early stages of recovery book ( Does not apply Given 07/15/21 1442)  clopidogrel (PLAVIX) tablet 300 mg (300 mg Oral Given 07/15/21 1552)    Mobility non-ambulatory High fall risk   Focused Assessments Neuro Assessment Handoff:  Swallow screen pass?  See Speech therapy note.   NIH Stroke Scale ( + Modified Stroke Scale Criteria)  Interval: Initial Level of Consciousness (1a.)   : Alert, keenly responsive LOC Questions (1b. )   +: Answers neither question correctly LOC Commands (1c. )   + : Performs neither task correctly Best Gaze (2. )  +: Normal Visual (3. )  +: No visual loss Facial Palsy (4. )    : Minor paralysis Motor Arm, Left (5a. )   +: Some effort against gravity Motor Arm, Right (5b. )   +: No effort against gravity Motor Leg, Left (6a. )   +: No effort against gravity Motor Leg, Right (6b. )   +: No  effort against gravity Limb Ataxia (7. ): Absent Sensory (8. )   +: Mild-to-moderate sensory loss, patient feels pinprick is less sharp or is dull on the affected side, or there is a loss of superficial pain with pinprick, but patient is aware of being touched  Best Language (9. )   +: Mute, global aphasia Dysarthria (10. ): Severe dysarthria, patient's speech is so slurred as to be unintelligible in the absence of or out of proportion to any dysphasia, or is mute/anarthric Extinction/Inattention (11.)   +: Visual/tactile/auditory/spatial/personal inattention Modified SS Total  +: 20 Complete NIHSS TOTAL: 24 Last date known well: 07/15/21 Last time known well: 0600 Neuro Assessment:   Neuro Checks:   Initial (07/15/21 1115)  Last Documented NIHSS Modified Score: 20 (07/15/21 1500) Has TPA been given? No If patient is a Neuro Trauma and patient is going to OR before floor call report to 4N Charge nurse: [phone] or [phone]   R Recommendations: See Admitting Provider Note  Report given to: pinaliben patel RN  Additional Notes: Swallow precautions! See Speech therapy note. Swallows crushed meds with applesauce.

## 2021-07-15 NOTE — Progress Notes (Signed)
  Echocardiogram 2D Echocardiogram has been performed.  Maria Hanna 07/15/2021, 3:50 PM

## 2021-07-15 NOTE — ED Provider Notes (Signed)
[place] EMERGENCY DEPARTMENT Provider Note   CSN: [id number] Arrival date & time: 07/15/21  1059  An emergency department physician performed an initial assessment on this suspected stroke patient at 1101.  History  Chief Complaint  Patient presents with   Code Stroke    Maria Hanna is a 88 y.o. female.  HPI Level 5 caveat secondary to nonverbal/acuity.  Patient arrives via EMS from a nursing facility, last seen normal was 5 hours prior to my evaluation.  Reportedly the patient is typically verbal, interactive at baseline, but has not been so since that time with new right upper extremity flaccidity, right facial droop.  Patient is admitted as a code stroke prior to arrival.    Home Medications Prior to Admission medications   Medication Sig Start Date End Date Taking? Authorizing Provider  aspirin 81 MG tablet Take 81 mg by mouth every other day.    [provider]  calcium carbonate (OS-CAL) 600 MG TABS tablet Take 600 mg by mouth 2 (two) times daily with a meal.    [provider]  clopidogrel (PLAVIX) 75 MG tablet Take 75 mg by mouth daily with breakfast.    [provider]  Cyanocobalamin (VITAMIN B-12 IJ) Inject as directed every 30 (thirty) days.    [provider]  fenofibrate (TRICOR) 145 MG tablet Take 1 tablet (145 mg total) by mouth daily. 07/28/14   Troy Sine, MD  hydrochlorothiazide (MICROZIDE) 12.5 MG capsule TAKE ONE CAPSULE BY MOUTH EVERY DAY **PATIENT NEEDS APPT FOR FUTURE REFILLS ** 07/17/14   Troy Sine, MD  lisinopril (PRINIVIL,ZESTRIL) 40 MG tablet Take 40 mg by mouth daily.    [provider]  Multiple Vitamins-Minerals (OCUVITE PRESERVISION PO) Take 1 tablet by mouth daily.    [provider]  pantoprazole (PROTONIX) 40 MG tablet Take 40 mg by mouth daily.    [provider]  psyllium (METAMUCIL) 58.6 % packet Take 1 packet by mouth daily as needed.      [provider]  rosuvastatin (CRESTOR) 20 MG tablet Take 20 mg by mouth daily.    [provider]      Allergies    Atorvastatin    Review of Systems   Review of Systems  Unable to perform ROS: Acuity of condition   Physical Exam Updated Vital Signs BP (!) 149/73   Pulse (!) 51   Temp (!) 97.5 F (36.4 C) (Axillary)   Resp 16   Wt 49 kg   SpO2 97%   BMI 20.41 kg/m  Physical Exam Vitals and nursing note reviewed.  Constitutional:      General: She is not in acute distress.    Appearance: She is well-developed. She is ill-appearing. She is not toxic-appearing.  HENT:     Head: Normocephalic and atraumatic.  Eyes:     Conjunctiva/sclera: Conjunctivae normal.  Cardiovascular:     Rate and Rhythm: Normal rate and regular rhythm.  Pulmonary:     Effort: Pulmonary effort is normal. No respiratory distress.     Breath sounds: Normal breath sounds. No stridor.  Abdominal:     General: There is no distension.  Skin:    General: Skin is warm and dry.  Neurological:     Mental Status: She is alert.     Cranial Nerves: No cranial nerve deficit.     Motor: Atrophy and abnormal muscle tone present.     Coordination: Coordination abnormal.  Comments: Essentially noninteractive, does not follow commands reliably, does not hold her right arm against gravity.  Psychiatric:        Mood and Affect: Mood normal.        Cognition and Memory: Cognition is impaired. Memory is impaired.    ED Results / Procedures / Treatments   Labs (all labs ordered are listed, but only abnormal results are displayed) Labs Reviewed  CBC - Abnormal; Notable for the following components:      Result Value   RBC 3.21 (*)    Hemoglobin 9.3 (*)    HCT 30.9 (*)    RDW 16.8 (*)    Platelets 402 (*)    All other components within normal limits  I-STAT CHEM 8, ED - Abnormal; Notable for the following components:   Potassium 7.2 (*)    BUN 66 (*)    Creatinine, Ser 2.30 (*)     Glucose, Bld 107 (*)    Calcium, Ion 1.04 (*)    Hemoglobin 9.5 (*)    HCT 28.0 (*)    All other components within normal limits  PROTIME-INR  APTT  DIFFERENTIAL  COMPREHENSIVE METABOLIC PANEL  CBG MONITORING, ED    EKG EKG Interpretation  Date/Time:  Monday July 15 2021 11:23:47 EDT Ventricular Rate:  64 PR Interval:  146 QRS Duration: 96 QT Interval:  397 QTC Calculation: 410 R Axis:   10 Text Interpretation: Sinus rhythm Probable left atrial enlargement Left ventricular hypertrophy Artifact Abnormal ECG Confirmed by Carmin Muskrat [phone]) on 07/15/2021 12:24:06 PM  Radiology CT HEAD CODE STROKE WO CONTRAST  Result Date: 07/15/2021 CLINICAL DATA:  Code stroke. Neuro deficit, acute, stroke suspected. Right-sided weakness and facial droop. Last known well 6 o'clock a.m. today. EXAM: CT HEAD WITHOUT CONTRAST TECHNIQUE: Contiguous axial images were obtained from the base of the skull through the vertex without intravenous contrast. RADIATION DOSE REDUCTION: This exam was performed according to the departmental dose-optimization program which includes automated exposure control, adjustment of the mA and/or kV according to patient size and/or use of iterative reconstruction technique. COMPARISON:  CT head without contrast 05/31/2021 at [facility] FINDINGS: Brain: Acute nonhemorrhagic infarct is present the left ACA territory with loss of gray-white differentiation in some effacement of the sulci. MCA territory is unremarkable. Basal ganglia are intact. Insular ribbon is normal. No acute or focal right hemisphere lesions are present. The brainstem and cerebellum are within normal limits. Vascular: Atherosclerotic calcifications are present within the cavernous internal carotid arteries bilaterally and the dural margin of the left vertebral artery. Proximal left A3 hyperdensity suspected. Correlate with CTA. Atherosclerotic calcifications are also present at the Skull: Calvarium is intact.  No focal lytic or blastic lesions are present. No significant extracranial soft tissue lesion is present. Sinuses/Orbits: The paranasal sinuses and mastoid air cells are clear. Bilateral lens replacements are noted. Globes and orbits are otherwise unremarkable. ASPECTS [facility] Stroke Program Early CT Score) - Ganglionic level infarction (caudate, lentiform nuclei, internal capsule, insula, M1-M3 cortex): 7/7 - Supraganglionic infarction (M4-M6 cortex): 3/3 Total score (0-10 with 10 being normal): 10/10 IMPRESSION: 1. Acute nonhemorrhagic infarct involving the left ACA territory. 2. Question hyperdensity of the left pericallosal artery at its origin. 3. ASPECTS is 10/10. The above was relayed via text pager to Dr. Leonel Ramsay on 07/15/2021 at 11:18 . Electronically Signed   By: San Morelle M.D.   On: 07/15/2021 11:18    Procedures Procedures    Medications Ordered in ED Medications  ondansetron (ZOFRAN)  injection 4 mg (has no administration in time range)  sodium chloride flush (NS) 0.9 % injection 3 mL (3 mLs Intravenous Given 07/15/21 1139)    ED Course/ Medical Decision Making/ A&P This patient with a Hx of carotid stenosis, hypertension, hyperlipidemia, anemia presents to the ED for concern of facial asymmetry, right-sided flaccidity, nonverbal status, now, this involves an extensive number of treatment options, and is a complaint that carries with it a high risk of complications and morbidity.    The differential diagnosis includes stroke, infection, dehydration   Social Determinants of Health:  Age, nursing home residency  Additional history obtained:  Additional history and/or information obtained from EMS, notable for time course for illness, vital signs unremarkable in transport   After the initial evaluation, orders, including: Code stroke CT Labs ECG were initiated.   Patient placed on Cardiac and Pulse-Oximetry Monitors. The patient was maintained on a cardiac monitor.   The cardiac monitored showed an rhythm of 50 sinus normal The patient was also maintained on pulse oximetry. The readings were typically 97% room air   On repeat evaluation of the patient stayed the same  Lab Tests:  I personally interpreted labs.  The pertinent results include: Hyperkalemia, though some question for hemolysis and will be redrawn  Imaging Studies ordered:  I independently visualized and interpreted imaging which showed evidence for MCA stroke I agree with the radiologist interpretation  Consultations Obtained:  I requested consultation with the neurology,  and discussed lab and imaging findings as well as pertinent plan - they recommend: Patient is not a candidate for TNK nor intravascular intervention, but will require admission for stroke  Dispostion / Final MDM:  After consideration of the diagnostic results and the patient's response to treatment, patient be admitted for further stroke work-up with evidence for this on CT, physical stigmata consistent as well.  No evidence for hemorrhage, bacteremia, sepsis.  She does have some electrolyte abnormalities as well.    Final Clinical Impression(s) / ED Diagnoses Final diagnoses:  Acute ischemic left ACA stroke (HCC)     Carmin Muskrat, MD 07/15/21 1226

## 2021-07-15 NOTE — H&P (Addendum)
History and Physical    Patient: Maria Hanna WGN:[id number] DOB: 03-22-1925 DOA: 07/15/2021 DOS: the patient was seen and examined on 07/15/2021 PCP: Lonie Peak, PA-C  Patient coming from: SNF  Chief Complaint:  Chief Complaint  Patient presents with   Code Stroke   HPI: [name] is a 88 y.o. female with medical history significant of hypertension, hyperlipidemia, CVA/TIA, Parkinson's disease, CKD, and iron deficiency anemia who presents with complaints of right-sided weakness and aphasia.  History is obtained from review of records and by family present at bedside as she is unable to talk at this time.  At baseline patient has been bedbound require total care since having COVID back in 11/2020 and is very hard of hearing.  She had been able to communicate and daughter reports that she was normally alert and oriented x3.  She had had a bout of confusion previously, but that was related to a urinary tract infection.  She was noted to be in her normal state of health this morning at 6 AM when she took her medications, but when given her 8:30 AM medications patient was noted to have trouble speaking along with right-sided weakness.  Her daughter makes note that the patient had a TIA several years ago that she can recall, but does not ever recall her being told that she had a stroke.  Upon admission into the emergency department patient was seen as a code stroke.  Noted to be afebrile with pulse 47-97, blood pressures 149/73-184/71, and O2 saturation maintained on room air.  CT scan of the head noted an acute nonhemorrhagic infarct involving the left ACA territory.  Patient was not a thrombolytic or thrombectomy candidate.  Labs noted hemoglobin 9.3, BUN 46, and creatinine 2.18.  Neurology recommended mission for further work-up.  Her daughter notes that her heart rates have always been slow and the patient had not recently had a bowel movement in several days.  They had switched  her medications from Milk of Magnesia to MiraLAX.  Review of Systems: unable to review all systems due to the inability of the patient to answer questions. Past Medical History:  Diagnosis Date   Carotid disease, bilateral (HCC)    Chronic kidney disease    Hyperlipidemia    Hypertension    Stroke [facility])    Past Surgical History:  Procedure Laterality Date   CATARACT EXTRACTION  7/97,10/97   NM MYOCAR PERF WALL MOTION  12/11/2008   negative for ischemia. Low risk scan.   THYROIDECTOMY  1959   US ECHOCARDIOGRAPHY  05/09/2010   mild ca+ of the AOV leaflets,AOV mildly sclerotic,aortic root sclerosis.   Social History:  reports that she has never smoked. She has never used smokeless tobacco. She reports that she does not drink alcohol and does not use drugs.  Allergies  Allergen Reactions   Atorvastatin Other (See Comments)    Muscle pain    Family History  Problem Relation Age of Onset   Cancer Father    Cancer Brother    Pneumonia Mother     Prior to Admission medications   Medication Sig Start Date End Date Taking? Authorizing Provider  aspirin 81 MG tablet Take 81 mg by mouth every other day.    [provider]  calcium carbonate (OS-CAL) 600 MG TABS tablet Take 600 mg by mouth 2 (two) times daily with a meal.    [provider]  clopidogrel (PLAVIX) 75 MG tablet Take 75 mg by mouth daily with  breakfast.    [provider]  Cyanocobalamin (VITAMIN B-12 IJ) Inject as directed every 30 (thirty) days.    [provider]  fenofibrate (TRICOR) 145 MG tablet Take 1 tablet (145 mg total) by mouth daily. 07/28/14   Lennette BihariKelly, Thomas A, MD  hydrochlorothiazide (MICROZIDE) 12.5 MG capsule TAKE ONE CAPSULE BY MOUTH EVERY DAY **PATIENT NEEDS APPT FOR FUTURE REFILLS ** 07/17/14   Lennette BihariKelly, Thomas A, MD  lisinopril (PRINIVIL,ZESTRIL) 40 MG tablet Take 40 mg by mouth daily.    [provider]  Multiple Vitamins-Minerals (OCUVITE PRESERVISION PO) Take 1  tablet by mouth daily.    [provider]  pantoprazole (PROTONIX) 40 MG tablet Take 40 mg by mouth daily.    [provider]  psyllium (METAMUCIL) 58.6 % packet Take 1 packet by mouth daily as needed.     [provider]  rosuvastatin (CRESTOR) 20 MG tablet Take 20 mg by mouth daily.    [provider]    Physical Exam: Vitals:   07/15/21 1130 07/15/21 1139 07/15/21 1240 07/15/21 1300  BP: (!) 149/73  (!) 166/36 (!) 151/33  Pulse: (!) 51  (!) 47 (!) 49  Resp: 16  16 15   Temp:  (!) 97.5 F (36.4 C)    TempSrc:  Axillary    SpO2: 97%  96% 95%  Weight:       Exam  Constitutional: Elderly female who appears to be in no acute distress.  Patient does not appear able to follow commands. Eyes: PERRL, lids and conjunctivae normal ENMT: Mucous membranes are moist.  Hard of hearing. Neck: normal, supple, no masses, no thyromegaly Respiratory: clear to auscultation bilaterally, no wheezing, no crackles. Normal respiratory effort.  Cardiovascular: Bradycardic with positive systolic murmur 2/6.  No extremity edema. 2+ pedal pulses. No carotid bruits.  Abdomen: Mildly distended abdomen without tenderness to palpation.  Bowel sounds appreciated in all 4 quadrants. Musculoskeletal: no clubbing / cyanosis.  Skin: no rashes, lesions, ulcers. No induration Neurologic: Patient appears able to move the left arm and left leg without significant issue.  Patient appears unable to move the right arm or leg to gravity.  She is able to move her right toes to noxious stimuli.  Aphasia.  Right-sided facial droop present. Psychiatric: Patient is alert but unable to assess for orientation due to aphasia.  Data Reviewed:  Sinus rhythm at 64 bpm with LVH.  Reviewed patient's labs, imaging, and pertinent records as noted above.  Assessment and Plan: CVA Acute.  Patient was last noted to be normal this morning at 6 AM when she woke up to take her meds, but by 8:30 AM was noted  to have right-sided weakness and aphasia.  CT revealed an acute nonhemorrhagic infarct involving the left ACA territory with question hyperdensity of the left pericallosal artery at the origin.  She was not a candidate for thrombolytics or thrombectomy.  Neurology recommending MRI and completion of stroke work-up. -Admit to telemetry bed -Stroke order set initiated -Neuro checks -Check Hemoglobin A1c and lipid panel in a.m. -Check  MRI brain and MRA head and neck -Check echocardiogram -PT/OT/Speech to eval and treat -Aspirin and Plavix -Appreciate neurology consultative services, will follow-up  -Social work consulted  Dysphagia Acute.  Thought secondary to above.  Patient has been evaluated by speech therapy and recommended a dysphagia 1 diet with thin liquids and meds with pure. -Continue recommendations by speech therapy  Acute kidney injury Patient presents with creatinine elevated up to 218 with  BUN 46.  Baseline creatinine previously had been 1 in 05/2020.  The elevated BUN to creatinine ratio suggest prerenal cause of symptoms. -Check urinalysis -Normal saline IV fluids at 75 mL/h -Recheck kidney function tomorrow morning  Constipation Acute.  Patient reportedly had not had a bowel movement in several days.  On physical exam patient with protuberant abdomen with normal bowel sounds. -Monitor intake and output -Check acute abdominal series -Senokot-S -Smog enema this evening  Sinus bradycardia Chronic.  Patient's heart rates on admission noted to be as low as 49 with blood pressures maintained.  Daughter reports that heart rates have always been slow. -Continue to monitor and consider atropine if patient showing signs of hypotension  Iron deficiency anemia Hemoglobin 9.3 g/dL which appears around patient's baseline.  Recent labs note concern for iron deficiency back in 2022. -Continue to monitor  Essential hypertension Patient's blood pressures 149/73 -184/71. Home regimen  includes amlodipine 2.5 mg daily and losartan-hydrochlorothiazide 100-25 mg daily. -Allowing for permissive hypertension only treating for systolic blood pressures greater than 220 or diastolics greater than 110  Hyperlipidemia Home medication regimen includes Crestor 5 mg daily. -Follow-up lipid panel -Goal LDL less than 70. -Continue Crestor  Thrombocytosis Platelet count elevated at 402.  Suspect reactive in nature from an acute stroke. -Continue to monitor   DVT prophylaxis: Heparin Advance Care Planning:   Code Status: DNR.  Patient came with DNR paperwork as well as daughter confirming this at bedside.  Consults: Neurology  Family Communication: Daughter and son-in-law updated at bedside  Severity of Illness: The appropriate patient status for this patient is INPATIENT. Inpatient status is judged to be reasonable and necessary in order to provide the required intensity of service to ensure the patient's safety. The patient's presenting symptoms, physical exam findings, and initial radiographic and laboratory data in the context of their chronic comorbidities is felt to place them at high risk for further clinical deterioration. Furthermore, it is not anticipated that the patient will be medically stable for discharge from the hospital within 2 midnights of admission.   * I certify that at the point of admission it is my clinical judgment that the patient will require inpatient hospital care spanning beyond 2 midnights from the point of admission due to high intensity of service, high risk for further deterioration and high frequency of surveillance required.*  Author: Clydie Braun, MD 07/15/2021 1:50 PM  For on call review [email or web address].

## 2021-07-15 NOTE — ED Triage Notes (Signed)
Pt BIB [facility] [facility] for a code stroke from [facility] and rehab in [place]. Staff gave her meds at [facility] and she was normal. Staff came back to check on pt around 10am and she was not talking and had right sided weakness and right sided facial droop. Pt has a 18g LAC.

## 2021-07-15 NOTE — Code Documentation (Signed)
Stroke Response Nurse Documentation Code Documentation  Maria Hanna is a 88 y.o. female arriving to [facility]  via [facility] on 07/15/21 with past medical hx of HTN, HLD, CKD, Stroke, bilateral carotid disease. On aspirin 81 mg daily and clopidogrel 75 mg daily. Code stroke was activated by EMS.   Patient from [facility] where she was LKW at 0600 when she was given morning meds. At 0830 med pass, patient was not at baseline and unable to take medications.  This change was discussed at morning rounds and decision was made to call EMS. The patient is total care and wheelchair bound at baseline.  Stroke team at the bedside on patient arrival. Labs drawn and patient cleared for CT by Dr. Jeraldine Loots. Patient to CT with team. NIHSS 24, see documentation for details and code stroke times. Patient with disoriented, right facial droop, bilateral arm weakness, bilateral leg weakness, right decreased sensation, Global aphasia , dysarthria , and Sensory  neglect on exam.   The following imaging was completed:  CT Head. Patient is not a candidate for IV Thrombolytic due to outside window. Patient is not a candidate for IR.   Care Plan: q2h mNIHSS and vitals, permissive HTN <220/110.   Bedside handoff with ED RN Brittany/April.    Scarlette Slice K  Stroke Response RN

## 2021-07-15 NOTE — Consult Note (Signed)
Neurology Consultation Reason for Consult: Right-sided weakness and aphasia Referring Physician: Vanita Panda, R  CC: Right-sided weakness and aphasia  History is obtained from: Chart  HPI: Maria Hanna is a 88 y.o. female with a history of CKD, hyperlipidemia, hypertension, stroke who presents with right-sided weakness and aphasia.  She was last known to be normal when she took her meds at 6 AM.  Subsequently, when given her 830 meds, she was noted to be having trouble speaking.  This was discussed at morning report, and the decision to call 911 was subsequently made.  At baseline she is described by the nursing home as "total care."  She is able to have some degree of conversation, but does have some dementia with intermittent confusion as well.  LKW: 6 AM tpa given?: no, outside of window Thrombectomy: No, poor MR S   Past Medical History:  Diagnosis Date   Carotid disease, bilateral ([place])    Chronic kidney disease    Hyperlipidemia    Hypertension    Stroke [facility])      Family History  Problem Relation Age of Onset   Cancer Father    Cancer Brother    Pneumonia Mother      Social History:  reports that she has never smoked. She has never used smokeless tobacco. She reports that she does not drink alcohol and does not use drugs.   Exam: Current vital signs: Wt 49 kg   BMI 20.41 kg/m  Vital signs in last 24 hours: Weight:  [49 kg] 49 kg (06/05 1100)   Physical Exam  Constitutional: Appears elderly  Neuro: Mental Status: Patient is awake, globally aphasic with no speech and does not follow commands. Cranial Nerves: II: She blinks to threat bilaterally. Pupils are equal, round, and reactive to light.   III,IV, VI: EOMI without ptosis or diploplia.  VII: She has right facial weakness Motor: She has a clear right hemiparesis with little effort against gravity of either the right arm or leg Sensory: She response to noxious stimulation less in the right  left Cerebellar: No clear ataxia on the left       I have reviewed labs in epic and the results pertinent to this consultation are: Creatinine 2.18 Glucose 108  I have reviewed the images obtained: CT head-left ACA territory infarct  Impression: 88 year old female with a dense aphasia and right-sided weakness with ACA territory infarct on CT.  The symptoms are more than I would typically expect for an ACA territory infarct, but you can get out sized effects in the elderly, particularly demented patients.  It is possible that she also has some MCA territory ischemia as well, especially given that she has a known ICA occlusion.  She is not a candidate for mechanical thrombectomy, and is outside the window for IV thrombolytics.  She will need to be admitted for observation, therapy, and secondary risk factor modification.  Recommendations: - HgbA1c, fasting lipid panel - MRI of the brain without contrast - Frequent neuro checks - Echocardiogram - MRA head and neck - Prophylactic therapy-Antiplatelet med: Aspirin - dose 81mg  and plavix 75mg  daily  after 300mg  load  - Risk factor modification - Telemetry monitoring - PT consult, OT consult, Speech consult - Stroke team to follow   Roland Rack, MD [facility] [phone]  If 7pm- 7am, please page neurology on call as listed in [place].

## 2021-07-15 NOTE — Evaluation (Signed)
Clinical/Bedside Swallow Evaluation Patient Details  Name: Maria Hanna MRN: [id number] Date of Birth: 1925-10-31  Today's Date: 07/15/2021 Time: SLP Start Time (ACUTE ONLY): 1200 SLP Stop Time (ACUTE ONLY): 1222 SLP Time Calculation (min) (ACUTE ONLY): 22 min  Past Medical History:  Past Medical History:  Diagnosis Date   Carotid disease, bilateral (HCC)    Chronic kidney disease    Hyperlipidemia    Hypertension    Stroke [facility])    Past Surgical History:  Past Surgical History:  Procedure Laterality Date   CATARACT EXTRACTION  7/97,10/97   NM MYOCAR PERF WALL MOTION  12/11/2008   negative for ischemia. Low risk scan.   THYROIDECTOMY  1959   US ECHOCARDIOGRAPHY  05/09/2010   mild ca+ of the AOV leaflets,AOV mildly sclerotic,aortic root sclerosis.   HPI:  Maria Hanna is a 88 y.o. female who presented by EMS from SNF with new onset aphasia, RUE weakness and right facial asymmetry. Reportedly is verbal and interactive at baseline.  Stroke w/u underway. PMHx HTN, HLD, CKD, stroke.    Assessment / Plan / Recommendation  Clinical Impression  Pt was alert, nonverbal and unable to follow commands.  She was expressive, smiling, and able to hold the cup and bring to her lips with LUE.  She consumed 4 oz of water with no s/s of dysphagia or aspiration. She accepted bites of applesauce and consumed half a container until she began to vomit.  RN was alerted. Face and chest were cleaned and she was able to hold the emesis bag independently with left hand.    When pt is cleared for POs, recommend starting dysphagia 1/thin liquids; give meds with puree. SLP will follow for readiness. Please order speech/language/cognitive evaluation as well - thank you.   SLP Visit Diagnosis: Dysphagia, unspecified (R13.10)           Diet Recommendation   Allow sips and chips until cleared for PO diet due to N/V       Other  Recommendations Oral Care Recommendations: Oral care QID     Recommendations for follow up therapy are one component of a multi-disciplinary discharge planning process, led by the attending physician.  Recommendations may be updated based on patient status, additional functional criteria and insurance authorization.  Follow up Recommendations Other (comment) (tba)      Assistance Recommended at Discharge Frequent or constant Supervision/Assistance  Functional Status Assessment    Frequency and Duration min 2x/week  1 week       Prognosis        Swallow Study   General HPI: Maria Hanna is a 88 y.o. female who presented by EMS from SNF with new onset aphasia, RUE weakness and right facial asymmetry. Reportedly is verbal and interactive at baseline.  Stroke w/u underway. PMHx HTN, HLD, CKD, stroke. Type of Study: Bedside Swallow Evaluation Previous Swallow Assessment: none per records Diet Prior to this Study: NPO Temperature Spikes Noted: No Respiratory Status: Room air History of Recent Intubation: No Behavior/Cognition: Alert Oral Cavity Assessment:  (unable to assess) Oral Cavity - Dentition: Adequate natural dentition Self-Feeding Abilities: Able to feed self;Needs assist Patient Positioning: Upright in bed Baseline Vocal Quality: Not observed Volitional Cough: Cognitively unable to elicit Volitional Swallow: Unable to elicit    Oral/Motor/Sensory Function Overall Oral Motor/Sensory Function: Other (comment) (not following commands)   Ice Chips Ice chips: Within functional limits   Thin Liquid Thin Liquid: Within functional limits    Nectar Thick Nectar Thick Liquid:  Not tested   Honey Thick Honey Thick Liquid: Not tested   Puree Puree: Within functional limits   Solid     Solid: Not tested      [name] 07/15/2021,12:49 PM   Marchelle Folks L. Samson Frederic, MA CCC/SLP Acute Rehabilitation Services Office number [phone] Pager [phone]

## 2021-07-16 DIAGNOSIS — N179 Acute kidney failure, unspecified: Secondary | ICD-10-CM | POA: Diagnosis not present

## 2021-07-16 DIAGNOSIS — R1310 Dysphagia, unspecified: Secondary | ICD-10-CM | POA: Diagnosis not present

## 2021-07-16 DIAGNOSIS — I639 Cerebral infarction, unspecified: Secondary | ICD-10-CM | POA: Diagnosis not present

## 2021-07-16 DIAGNOSIS — I63522 Cerebral infarction due to unspecified occlusion or stenosis of left anterior cerebral artery: Secondary | ICD-10-CM | POA: Diagnosis not present

## 2021-07-16 DIAGNOSIS — K5900 Constipation, unspecified: Secondary | ICD-10-CM | POA: Diagnosis not present

## 2021-07-16 LAB — CBC
HCT: 26.2 % — ABNORMAL LOW (ref 36.0–46.0)
Hemoglobin: 7.7 g/dL — ABNORMAL LOW (ref 12.0–15.0)
MCH: 27.8 pg (ref 26.0–34.0)
MCHC: 29.4 g/dL — ABNORMAL LOW (ref 30.0–36.0)
MCV: 94.6 fL (ref 80.0–100.0)
Platelets: 428 10*3/uL — ABNORMAL HIGH (ref 150–400)
RBC: 2.77 MIL/uL — ABNORMAL LOW (ref 3.87–5.11)
RDW: 16.1 % — ABNORMAL HIGH (ref 11.5–15.5)
WBC: 11.7 10*3/uL — ABNORMAL HIGH (ref 4.0–10.5)
nRBC: 0 % (ref 0.0–0.2)

## 2021-07-16 LAB — BASIC METABOLIC PANEL
Anion gap: 7 (ref 5–15)
BUN: 45 mg/dL — ABNORMAL HIGH (ref 8–23)
CO2: 23 mmol/L (ref 22–32)
Calcium: 8.7 mg/dL — ABNORMAL LOW (ref 8.9–10.3)
Chloride: 107 mmol/L (ref 98–111)
Creatinine, Ser: 2 mg/dL — ABNORMAL HIGH (ref 0.44–1.00)
GFR, Estimated: 23 mL/min — ABNORMAL LOW (ref >60)
Glucose, Bld: 127 mg/dL — ABNORMAL HIGH (ref 70–99)
Potassium: 3.8 mmol/L (ref 3.5–5.1)
Sodium: 137 mmol/L (ref 135–145)

## 2021-07-16 MED ORDER — TICAGRELOR 90 MG PO TABS
90.0000 mg | ORAL_TABLET | Freq: Two times a day (BID) | ORAL | Status: DC
  Administered 2021-07-17: 90 mg via ORAL
  Filled 2021-07-16: qty 1

## 2021-07-16 NOTE — Evaluation (Signed)
Physical Therapy Evaluation Patient Details Name: Maria Hanna MRN: [id number] DOB: 1925/09/16 Today's Date: 07/16/2021  History of Present Illness  Maria Hanna is a 88 y.o. female who presents with complaints of right-sided weakness PMH: hypertension, hyperlipidemia, CVA/TIA, Parkinson's disease, CKD, and iron deficiency anemia. pt from SNF, bed bound since oct 2022 since she had COVID.   Clinical Impression  Per RN, per family report yesterday, pt has been bed bound since Oct 2022 when she got COVID. Pt was able to feed self and was conversant. Pt now non-verbal, with no initiation of movement and dependent for all mobility and ADLs. Pt with no attempt to move R UE and LE, pt with trace grip and attempt at LAQ EOB. Palliative consult may be beneficial to determine goals of care as medical and functional progress appear poor. Acute PT to cont to follow.       Recommendations for follow up therapy are one component of a multi-disciplinary discharge planning process, led by the attending physician.  Recommendations may be updated based on patient status, additional functional criteria and insurance authorization.  Follow Up Recommendations Skilled nursing-short term rehab (<3 hours/day) (return to facility)    Assistance Recommended at Discharge Frequent or constant Supervision/Assistance  Patient can return home with the following       Equipment Recommendations None recommended by PT  Recommendations for Other Services   (palliative consult)    Functional Status Assessment Patient has had a recent decline in their functional status and/or demonstrates limited ability to make significant improvements in function in a reasonable and predictable amount of time     Precautions / Restrictions Precautions Precautions: Fall Restrictions Weight Bearing Restrictions: No      Mobility  Bed Mobility Overal bed mobility: Needs Assistance Bed Mobility: Supine to Sit, Sit  to Supine     Supine to sit: Total assist Sit to supine: Total assist   General bed mobility comments: pt with no initiation for transfer, total dependent    Transfers                   General transfer comment: pt will need lift    Ambulation/Gait               General Gait Details: pt non ambulatory  Stairs            Wheelchair Mobility    Modified Rankin (Stroke Patients Only) Modified Rankin (Stroke Patients Only) Pre-Morbid Rankin Score: Severe disability Modified Rankin: Severe disability     Balance Overall balance assessment: Needs assistance Sitting-balance support: Feet supported, No upper extremity supported Sitting balance-Leahy Scale: Zero Sitting balance - Comments: dependent                                     Pertinent Vitals/Pain Pain Assessment Pain Assessment: Faces Faces Pain Scale: No hurt    Home Living Family/patient expects to be discharged to:: Skilled nursing facility                        Prior Function Prior Level of Function : Needs assist             Mobility Comments: per RN, per family was able to amb until she had COVID in Oct 2022 and which she has since been bed bound with minimal OOB time ADLs Comments: dependent  Hand Dominance        Extremity/Trunk Assessment   Upper Extremity Assessment Upper Extremity Assessment: RUE deficits/detail;LUE deficits/detail RUE Deficits / Details: flaccid, no initiation LUE Deficits / Details: minimal active movement, trace in grip    Lower Extremity Assessment Lower Extremity Assessment: RLE deficits/detail;LLE deficits/detail RLE Deficits / Details: no initiation of movement LLE Deficits / Details: trace movement in attempt to LAQ    Cervical / Trunk Assessment Cervical / Trunk Assessment: Kyphotic  Communication   Communication: Receptive difficulties;Expressive difficulties (pt non-verbal, no attempt to initiate  vocalization)  Cognition Arousal/Alertness: Awake/alert Behavior During Therapy: Flat affect Overall Cognitive Status: Impaired/Different from baseline                                 General Comments: pt non verbal. pt did attempt to squeeze hand on the L side, smiled to command, attempted to kick with L LE, pt unable to move R side at all. Pt turned head towards name        General Comments General comments (skin integrity, edema, etc.): fragile skin, VSS    Exercises     Assessment/Plan    PT Assessment Patient needs continued PT services  PT Problem List Decreased strength;Decreased activity tolerance;Decreased balance;Decreased mobility;Decreased knowledge of use of DME       PT Treatment Interventions Functional mobility training;Therapeutic activities;Therapeutic exercise;Balance training;Neuromuscular re-education    PT Goals (Current goals can be found in the Care Plan section)  Acute Rehab PT Goals PT Goal Formulation: Patient unable to participate in goal setting Time For Goal Achievement: 07/30/21 Potential to Achieve Goals: Fair    Frequency Min 2X/week     Co-evaluation               AM-PAC PT "6 Clicks" Mobility  Outcome Measure Help needed turning from your back to your side while in a flat bed without using bedrails?: Total Help needed moving from lying on your back to sitting on the side of a flat bed without using bedrails?: Total Help needed moving to and from a bed to a chair (including a wheelchair)?: Total Help needed standing up from a chair using your arms (e.g., wheelchair or bedside chair)?: Total Help needed to walk in hospital room?: Total Help needed climbing 3-5 steps with a railing? : Total 6 Click Score: 6    End of Session   Activity Tolerance: Patient tolerated treatment well Patient left: in bed;with call bell/phone within reach;with bed alarm set Nurse Communication: Mobility status (replace purwick) PT Visit  Diagnosis: Unsteadiness on feet (R26.81);Muscle weakness (generalized) (M62.81);Difficulty in walking, not elsewhere classified (R26.2)    Time: [id number] PT Time Calculation (min) (ACUTE ONLY): 16 min   Charges:   PT Evaluation $PT Eval Moderate Complexity: 1 Mod          Kittie Plater, PT, DPT Acute Rehabilitation Services Secure chat preferred Office #: [phone]   Berline Lopes 07/16/2021, 9:41 AM

## 2021-07-16 NOTE — Evaluation (Signed)
Speech Language Pathology Evaluation Patient Details Name: Maria Hanna MRN: [id number] DOB: 05/14/1925 Today's Date: 07/16/2021 Time: 1030-     Problem List:  Patient Active Problem List   Diagnosis Date Noted   CVA (cerebral vascular accident) ([place]) 07/15/2021   Dysphagia 07/15/2021   AKI (acute kidney injury) ([place]) 07/15/2021   Constipation 07/15/2021   Iron deficiency anemia 07/15/2021   Sinus bradycardia 07/15/2021   Thrombocytosis 07/15/2021   Anemia in chronic kidney disease 08/28/2020   Aortic sclerosis 04/25/2013   Ankle edema 04/25/2013   Carotid stenosis 01/13/2013   HTN (hypertension) 01/13/2013   Hyperlipidemia with target LDL less than 70 01/13/2013   GERD (gastroesophageal reflux disease) 01/13/2013   Anemia 01/13/2013   Past Medical History:  Past Medical History:  Diagnosis Date   Carotid disease, bilateral ([place])    Chronic kidney disease    Hyperlipidemia    Hypertension    Stroke [facility])    Past Surgical History:  Past Surgical History:  Procedure Laterality Date   CATARACT EXTRACTION  7/97,10/97   NM MYOCAR PERF WALL MOTION  12/11/2008   negative for ischemia. Low risk scan.   THYROIDECTOMY  1959   US ECHOCARDIOGRAPHY  05/09/2010   mild ca+ of the AOV leaflets,AOV mildly sclerotic,aortic root sclerosis.   HPI:  Maria Hanna is a 88 y.o. female who presented by EMS from SNF with new onset aphasia, RUE weakness and right facial asymmetry. Reportedly is verbal and interactive at baseline.  MRI showed acute infarction of the left frontal  lobe and left genu of the corpus callosum consistent with anterior  cerebral artery distribution infarction. PMHx HTN, HLD, CKD, stroke.   Assessment / Plan / Recommendation Clinical Impression  Pt admitted with large left ACA CVA which has left her nonverbal.  With max cues - modeling, tactile motion, repetition - pt was able to achieve an /ah/ sound on two occasions, but otherwise unable to phonate. No  spontaneous effort to voice was noted. She followed some simple commands (open mouth, close your eyes).  She used facial expressions to respond to examiner in a generalized way. Her son and daughter were at bedside, and after discussion with Dr.Sethi they acknowledged the severity of her stroke and its negative impact on recovery.  SLP will follow pending D/C back to SNF.    SLP Assessment  SLP Recommendation/Assessment: Patient needs continued [facility] SLP Visit Diagnosis: Cognitive communication deficit (R41.841)    Recommendations for follow up therapy are one component of a multi-disciplinary discharge planning process, led by the attending physician.  Recommendations may be updated based on patient status, additional functional criteria and insurance authorization.    Follow Up Recommendations  Skilled nursing-short term rehab (<3 hours/day)            Frequency and Duration min 2x/week  1 week      SLP Evaluation Cognition  Overall Cognitive Status: Impaired/Different from baseline Arousal/Alertness: Awake/alert       Comprehension  Auditory Comprehension Overall Auditory Comprehension: Impaired Yes/No Questions: Impaired Basic Biographical Questions: 0-25% accurate Commands: Impaired Reading Comprehension Reading Status: Impaired Word level: Unable to assess (comment)    Expression Expression Primary Mode of Expression: Verbal Verbal Expression Overall Verbal Expression: Impaired Initiation: Impaired Repetition: Impaired Pragmatics: No impairment Written Expression Dominant Hand: Right   Oral / Motor  Motor Speech Overall Motor Speech: Impaired (nonverbal)            Maria Hanna 07/16/2021, 11:07 AM  Maria Hanna, [place] Office number [phone] Pager [phone]

## 2021-07-16 NOTE — Progress Notes (Addendum)
STROKE TEAM PROGRESS NOTE   INTERVAL HISTORY Patient is seen in her room with her daughter and son at the bedside.  She resides in a SNF and presented with right sided weakness and aphasia.  She was found to have a large left ACA stroke.  She has a chronic left ICA occlusion known since 2012.  She was unable to follow commands but moves her LUE and BLE nonpurposefully. MRI scan shows a large complete territory left ACA infarct.  MRA shows left ACA occlusion and chronic left proximal ICA occlusion but filling of the left MCA through collaterals. Vitals:   07/15/21 2332 07/16/21 0314 07/16/21 0823 07/16/21 1157  BP: (!) 175/40 (!) 150/57 (!) 145/39 (!) 122/39  Pulse: (!) 55 91 60 72  Resp: Temp: 98.6 F (37 C) 99.2 F (37.3 C) 98.5 F (36.9 C) 98.5 F (36.9 C)  TempSrc: Oral Oral Oral Oral  SpO2: 96% 100% 98% 96%  Weight:       CBC:  Recent Labs  Lab 07/15/21 1100 07/15/21 1110 07/16/21 0232  WBC 8.6  --  11.7*  NEUTROABS 6.5  --   --   HGB 9.3* 9.5* 7.7*  HCT 30.9* 28.0* 26.2*  MCV 96.3  --  94.6  PLT 402*  --  428*   Basic Metabolic Panel:  Recent Labs  Lab 07/15/21 1235 07/16/21 0232  NA 138 137  K 3.9 3.8  CL 105 107  CO2 23 23  GLUCOSE 128* 127*  BUN 46* 45*  CREATININE 2.18* 2.00*  CALCIUM 9.2 8.7*   Lipid Panel:  Recent Labs  Lab 07/15/21 1235  CHOL 151  TRIG 215*  HDL 30*  CHOLHDL 5.0  VLDL 43*  LDLCALC 78   HgbA1c:  Recent Labs  Lab 07/15/21 1100  HGBA1C 5.3   Urine Drug Screen: No results for input(s): LABOPIA, COCAINSCRNUR, LABBENZ, AMPHETMU, THCU, LABBARB in the last 168 hours.  Alcohol Level No results for input(s): ETH in the last 168 hours.  IMAGING past 24 hours MR ANGIO HEAD WO CONTRAST  Result Date: 07/15/2021 CLINICAL DATA:  Stroke, follow-up. Last seen normal 5 hours before admission. Mental status changes. Abnormal head CT. EXAM: MRI HEAD WITHOUT CONTRAST MRA HEAD WITHOUT CONTRAST MRA NECK WITHOUT CONTRAST TECHNIQUE:  Multiplanar, multiecho pulse sequences of the brain and surrounding structures were obtained without intravenous contrast. Angiographic images of the Circle of Willis were obtained using MRA technique without intravenous contrast. Angiographic images of the neck were obtained using MRA technique without intravenous contrast. Carotid stenosis measurements (when applicable) are obtained utilizing NASCET criteria, using the distal internal carotid diameter as the denominator. COMPARISON:  Head CT earlier same day.  MRI 04/19/2009 FINDINGS: MRI HEAD FINDINGS Brain: Diffusion imaging shows acute infarction of the left frontal lobe and left genu of the corpus callosum consistent with anterior cerebral artery distribution infarction. Mild swelling but no visible hemorrhage. Elsewhere, brainstem and cerebellum are normal. Cerebral hemispheres are otherwise normal without background small-vessel disease, extraordinary at this age. No hydrocephalus. No extra-axial collection. Vascular: Abnormal flow appearance of the left internal carotid artery. Skull and upper cervical spine: Negative Sinuses/Orbits: Normal Other: Right mastoid effusion. MRA HEAD FINDINGS Right internal carotid artery is patent through the skull base and siphon regions. Right anterior and middle cerebral vessels are patent. No flow seen in the left anterior cerebral artery. No antegrade flow of the ICA through the skull base. Large patent posterior communicating artery gives supply to the left  MCA territory. Non dominant right vertebral artery serves PICA and gives a tiny contribution to the basilar. Dominant left vertebral artery widely patent to the basilar. No basilar stenosis. Posterior circulation branch vessels are patent. Patent left PCA as noted. MRA NECK FINDINGS Right common carotid artery widely patent to the bifurcation. 20% stenosis of the internal carotid artery at the bifurcation level but wide patency beyond that. There is stenosis of the  ECA. Left common carotid artery is occluded at the origin. There is a patent left external carotid artery. Both vertebral arteries are patent through the cervical region showing antegrade flow. The left is dominant. IMPRESSION: Acute infarction in the left anterior cerebral artery territory. Mild swelling but no hemorrhage. Otherwise normal appearance of the brain. 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 Left external carotid artery continues to show flow. No flow in the ICA at the skull base. Blood flow to the left hemisphere occurs through a patent posterior communicating artery, supplying the left MCA territory. No flow seen in the left anterior cerebral artery. Electronically Signed   By: Paulina Fusi M.D.   On: 07/15/2021 18:28   MR ANGIO NECK WO CONTRAST  Result Date: 07/15/2021 CLINICAL DATA:  Stroke, follow-up. Last seen normal 5 hours before admission. Mental status changes. Abnormal head CT. EXAM: MRI HEAD WITHOUT CONTRAST MRA HEAD WITHOUT CONTRAST MRA NECK WITHOUT CONTRAST TECHNIQUE: Multiplanar, multiecho pulse sequences of the brain and surrounding structures were obtained without intravenous contrast. Angiographic images of the Circle of Willis were obtained using MRA technique without intravenous contrast. Angiographic images of the neck were obtained using MRA technique without intravenous contrast. Carotid stenosis measurements (when applicable) are obtained utilizing NASCET criteria, using the distal internal carotid diameter as the denominator. COMPARISON:  Head CT earlier same day.  MRI 04/19/2009 FINDINGS: MRI HEAD FINDINGS Brain: Diffusion imaging shows acute infarction of the left frontal lobe and left genu of the corpus callosum consistent with anterior cerebral artery distribution infarction. Mild swelling but no visible hemorrhage. Elsewhere, brainstem and cerebellum are normal. Cerebral hemispheres are otherwise normal without background small-vessel disease, extraordinary  at this age. No hydrocephalus. No extra-axial collection. Vascular: Abnormal flow appearance of the left internal carotid artery. Skull and upper cervical spine: Negative Sinuses/Orbits: Normal Other: Right mastoid effusion. MRA HEAD FINDINGS Right internal carotid artery is patent through the skull base and siphon regions. Right anterior and middle cerebral vessels are patent. No flow seen in the left anterior cerebral artery. No antegrade flow of the ICA through the skull base. Large patent posterior communicating artery gives supply to the left MCA territory. Non dominant right vertebral artery serves PICA and gives a tiny contribution to the basilar. Dominant left vertebral artery widely patent to the basilar. No basilar stenosis. Posterior circulation branch vessels are patent. Patent left PCA as noted. MRA NECK FINDINGS Right common carotid artery widely patent to the bifurcation. 20% stenosis of the internal carotid artery at the bifurcation level but wide patency beyond that. There is stenosis of the ECA. Left common carotid artery is occluded at the origin. There is a patent left external carotid artery. Both vertebral arteries are patent through the cervical region showing antegrade flow. The left is dominant. IMPRESSION: Acute infarction in the left anterior cerebral artery territory. Mild swelling but no hemorrhage. Otherwise normal appearance of the brain. 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 Left external carotid artery continues to show flow. No flow in the ICA at the skull base. Blood flow  to the left hemisphere occurs through a patent posterior communicating artery, supplying the left MCA territory. No flow seen in the left anterior cerebral artery. Electronically Signed   By: Paulina FusiMark  Shogry M.D.   On: 07/15/2021 18:25   MR BRAIN WO CONTRAST  Result Date: 07/15/2021 CLINICAL DATA:  Stroke, follow-up. Last seen normal 5 hours before admission. Mental status changes. Abnormal head  CT. EXAM: MRI HEAD WITHOUT CONTRAST MRA HEAD WITHOUT CONTRAST MRA NECK WITHOUT CONTRAST TECHNIQUE: Multiplanar, multiecho pulse sequences of the brain and surrounding structures were obtained without intravenous contrast. Angiographic images of the Circle of Willis were obtained using MRA technique without intravenous contrast. Angiographic images of the neck were obtained using MRA technique without intravenous contrast. Carotid stenosis measurements (when applicable) are obtained utilizing NASCET criteria, using the distal internal carotid diameter as the denominator. COMPARISON:  Head CT earlier same day.  MRI 04/19/2009 FINDINGS: MRI HEAD FINDINGS Brain: Diffusion imaging shows acute infarction of the left frontal lobe and left genu of the corpus callosum consistent with anterior cerebral artery distribution infarction. Mild swelling but no visible hemorrhage. Elsewhere, brainstem and cerebellum are normal. Cerebral hemispheres are otherwise normal without background small-vessel disease, extraordinary at this age. No hydrocephalus. No extra-axial collection. Vascular: Abnormal flow appearance of the left internal carotid artery. Skull and upper cervical spine: Negative Sinuses/Orbits: Normal Other: Right mastoid effusion. MRA HEAD FINDINGS Right internal carotid artery is patent through the skull base and siphon regions. Right anterior and middle cerebral vessels are patent. No flow seen in the left anterior cerebral artery. No antegrade flow of the ICA through the skull base. Large patent posterior communicating artery gives supply to the left MCA territory. Non dominant right vertebral artery serves PICA and gives a tiny contribution to the basilar. Dominant left vertebral artery widely patent to the basilar. No basilar stenosis. Posterior circulation branch vessels are patent. Patent left PCA as noted. MRA NECK FINDINGS Right common carotid artery widely patent to the bifurcation. 20% stenosis of the  internal carotid artery at the bifurcation level but wide patency beyond that. There is stenosis of the ECA. Left common carotid artery is occluded at the origin. There is a patent left external carotid artery. Both vertebral arteries are patent through the cervical region showing antegrade flow. The left is dominant. IMPRESSION: Acute infarction in the left anterior cerebral artery territory. Mild swelling but no hemorrhage. Otherwise normal appearance of the brain. 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 Left external carotid artery continues to show flow. No flow in the ICA at the skull base. Blood flow to the left hemisphere occurs through a patent posterior communicating artery, supplying the left MCA territory. No flow seen in the left anterior cerebral artery. Electronically Signed   By: Paulina FusiMark  Shogry M.D.   On: 07/15/2021 17:57   DG ABD ACUTE 2+V W 1V CHEST  Result Date: 07/15/2021 CLINICAL DATA:  Constipation. EXAM: DG ABDOMEN ACUTE WITH 1 VIEW CHEST COMPARISON:  May 31, 2021 FINDINGS: There is no evidence of dilated bowel loops or free intraperitoneal air. A moderate stool burden is noted. No radiopaque calculi are identified. Numerous subcentimeter gallstones are seen overlying the right upper quadrant. Heart size and mediastinal contours are within normal limits. Mild to moderate severity atelectasis and/or infiltrate is seen within the left lung base. There is a small left pleural effusion. IMPRESSION: 1. Moderate stool burden without evidence of bowel obstruction. 2. Mild to moderate severity left basilar atelectasis and/or infiltrate. 3. Small left  pleural effusion. 4. Cholelithiasis. Electronically Signed   By: Aram Candela M.D.   On: 07/15/2021 20:08   ECHOCARDIOGRAM COMPLETE  Result Date: 07/15/2021    ECHOCARDIOGRAM REPORT   Patient Name:   Maria Hanna Date of Exam: 07/15/2021 Medical Rec #:  [id number]             Height:       61.0 in Accession #:    [id number]             Weight:       108.0 lb Date of Birth:  Aug 12, 1925             BSA:          1.454 m Patient Age:    88 years              BP:           155/42 mmHg Patient Gender: F                     HR:           53 bpm. Exam Location:  Inpatient Procedure: 2D Echo, Cardiac Doppler and Color Doppler Indications:    Stroke  History:        Patient has prior history of Echocardiogram examinations, most                 recent 05/02/2013. Risk Factors:Hypertension and HLD.  Sonographer:    Rodrigo Ran RCS Referring Phys: [phone] RONDELL A SMITH IMPRESSIONS  1. Left ventricular ejection fraction, by estimation, is 60 to 65%. The left ventricle has normal function. The left ventricle has no regional wall motion abnormalities. Left ventricular diastolic parameters are indeterminate.  2. Right ventricular systolic function is normal. The right ventricular size is normal.  3. The mitral valve is normal in structure. Mild mitral valve regurgitation. No evidence of mitral stenosis.  4. The aortic valve is calcified. Aortic valve regurgitation is mild. Severe aortic valve stenosis. Aortic regurgitation PHT measures 596 msec. Aortic valve area, by VTI measures 0.99 cm. Aortic valve mean gradient measures 45.4 mmHg. Aortic valve Vmax  measures 4.44 m/s.  5. The inferior vena cava is normal in size with greater than 50% respiratory variability, suggesting right atrial pressure of 3 mmHg. FINDINGS  Left Ventricle: Left ventricular ejection fraction, by estimation, is 60 to 65%. The left ventricle has normal function. The left ventricle has no regional wall motion abnormalities. The left ventricular internal cavity size was normal in size. There is  no left ventricular hypertrophy. Left ventricular diastolic parameters are indeterminate. Right Ventricle: The right ventricular size is normal. No increase in right ventricular wall thickness. Right ventricular systolic function is normal. Left Atrium: Left atrial size was normal in  size. Right Atrium: Right atrial size was normal in size. Pericardium: There is no evidence of pericardial effusion. Presence of epicardial fat layer. Mitral Valve: The mitral valve is normal in structure. Mild to moderate mitral annular calcification. Mild mitral valve regurgitation. No evidence of mitral valve stenosis. Tricuspid Valve: The tricuspid valve is normal in structure. Tricuspid valve regurgitation is not demonstrated. No evidence of tricuspid stenosis. Aortic Valve: The aortic valve is calcified. Aortic valve regurgitation is mild. Aortic regurgitation PHT measures 596 msec. Severe aortic stenosis is present. Aortic valve mean gradient measures 45.4 mmHg. Aortic valve peak gradient measures 78.8 mmHg. Aortic valve area, by VTI measures 0.99 cm. Pulmonic Valve: The pulmonic valve was normal  in structure. Pulmonic valve regurgitation is trivial. No evidence of pulmonic stenosis. Aorta: The aortic root is normal in size and structure. Venous: The inferior vena cava is normal in size with greater than 50% respiratory variability, suggesting right atrial pressure of 3 mmHg. IAS/Shunts: No atrial level shunt detected by color flow Doppler.  LEFT VENTRICLE PLAX 2D LVIDd:         5.20 cm   Diastology LVIDs:         2.90 cm   LV e' medial:    3.92 cm/s LV PW:         1.00 cm   LV E/e' medial:  20.4 LV IVS:        1.00 cm   LV e' lateral:   3.59 cm/s LVOT diam:     2.00 cm   LV E/e' lateral: 22.3 LV SV:         102 LV SV Index:   70 LVOT Area:     3.14 cm  RIGHT VENTRICLE             IVC RV Basal diam:  2.60 cm     IVC diam: 1.30 cm RV S prime:     18.90 cm/s TAPSE (M-mode): 1.6 cm LEFT ATRIUM             Index        RIGHT ATRIUM          Index LA diam:        3.90 cm 2.68 cm/m   RA Area:     9.16 cm LA Vol (A2C):   26.7 ml 18.37 ml/m  RA Volume:   16.70 ml 11.49 ml/m LA Vol (A4C):   34.2 ml 23.53 ml/m LA Biplane Vol: 30.5 ml 20.98 ml/m  AORTIC VALVE                     PULMONIC VALVE AV Area (Vmax):     0.93 cm      PV Vmax:       1.42 m/s AV Area (VTI):     0.99 cm      PV Peak grad:  8.1 mmHg AV Vmax:           443.80 cm/s AV Vmean:          316.000 cm/s AV VTI:            1.032 m AV Peak Grad:      78.8 mmHg AV Mean Grad:      45.4 mmHg LVOT Vmax:         131.00 cm/s LVOT VTI:          0.326 m LVOT/AV VTI ratio: 0.32 AI PHT:            596 msec  AORTA Ao Root diam: 3.00 cm MITRAL VALVE                TRICUSPID VALVE MV Area (PHT): 2.38 cm     TR Peak grad:   10.6 mmHg MV Decel Time: 319 msec     TR Vmax:        163.00 cm/s MR Peak grad: 179.0 mmHg MR Vmax:      669.00 cm/s   SHUNTS MV E velocity: 79.90 cm/s   Systemic VTI:  0.33 m MV A velocity: 134.00 cm/s  Systemic Diam: 2.00 cm MV E/A ratio:  0.60 Kardie Tobb DO Electronically signed by Thomasene Ripple DO Signature Date/Time: 07/15/2021/5:15:17 PM  Final     PHYSICAL EXAM General:  Frail, elderly Caucasian lady in no acute distress Respiratory:  Regular, unlabored respirations on room air Neurological:  Patient is globally aphasic and mute and does not respond to voice or follow commands.  Will not mimic.  PERRL, blinks to threat bilaterally.  Right facial droop present.  Will move LUE with antigravity strength, RUE flaccid, 2/5 strength and good tone in BLE.  But moves right lower extremity less than left lower extremity.  ASSESSMENT/PLAN [name] is a 88 y.o. female with history of CKD, HLD, HTN and stroke presenting with right sided weakness and aphasia.  She was found to have a large left ACA stroke.  She has a chronic left ICA occlusion.  She was unable to follow commands but moves her LUE and BLE nonpurposefully.  Stroke:  left ACA infarct likely secondary due to embolism in setting of possible atrial fibrillation and left ICA occlusion Code Stroke CT head Acute infarct in left ACA territory ASPECTS 10.    MRI  left ACA territory infarct MRA head and neck  no flow in left ACA, occluded left ICA 2D Echo EF 60-65%, no atrial  level shunt LDL 78 HgbA1c 5.3 VTE prophylaxis - SQH    Diet   DIET - DYS 1 Room service appropriate? Yes with Assist; Fluid consistency: Thin   aspirin 81 mg daily and clopidogrel 75 mg daily prior to admission, now on aspirin 81 mg daily and Brilinta (ticagrelor) 90 mg bid for 3 weeks then aspirin indefinitely Therapy recommendations:  SNF Disposition:  likely back to SNF  Hypertension Home meds:  amlodipine 2.5 mg daily, losartan-HCTZ 100-25 daily Stable Permissive hypertension (OK if < 220/120) but gradually normalize in 5-7 days Long-term BP goal normotensive  Hyperlipidemia Home meds:  rosuvastatin 5 mg daily, resumed in hospital LDL 78, goal < 70 High intensity statin not indicated due to advanced age and LDL near goal Continue statin at discharge  Dysphagia Patient is able to tolerate PO foods and fluids Appreciate SLP recommendations Continue dysphagia 1 diet Family states patient would not want a feeding tube   Other Stroke Risk Factors Advanced Age >/= 29  Hx stroke  Other Active Problems none  Hospital day # 1  Cortney E Ernestina Columbia , MSN, AGACNP-BC [facility] See Amion for schedule and pager information 07/16/2021 1:18 PM   STROKE MD NOTE :  I have personally obtained history,examined this patient, reviewed notes, independently viewed imaging studies, participated in medical decision making and plan of care.ROS completed by me personally and pertinent positives fully documented  I have made any additions or clarifications directly to the above note. Agree with note above.  Patient presented with aphasia and right arm weakness secondary to large left ACA infarct likely due to left ACA occlusion of possibly embolic etiology from paroxysmal A-fib which has not yet been found.  She also has history of previous stroke with chronic left ICA occlusion known at least since 2012.  Recommend aspirin and Brilinta for 4 weeks followed by aspirin alone and  discontinue Plavix.  Continue ongoing stroke work-up.  She may need 30-day heart monitor after discharge to look for paroxysmal A-fib his family is going to consider anticoagulation if A-fib is found.  Patient's prognosis is guarded given advanced age and significant deficits from her stroke.  She is already living in a nursing home her daughter is slightly restricted about her prognosis and family does not want to  do very aggressive care would like her to go back to skilled nursing facility soon and they may be open to consideration of hospice if her condition deteriorates further.  Discussed with Dr.Pokhrel.  Greater than 50% time during this 50-minute visit was spent on counseling and coordination of care about her stroke and discussion with patient, daughter and care team and answering questions.  Delia Heady, MD Medical Director [facility] Pager: [phone] 07/16/2021 2:22 PM   To contact Stroke Continuity provider, please refer to [email or web address]. After hours, contact General Neurology

## 2021-07-16 NOTE — Hospital Course (Addendum)
Maria Hanna is a 88 y.o. female with past medical history of hypertension, hyperlipidemia, CVA, Parkinson's disease, CKD, iron deficiency anemia presented to the hospital with right-sided weakness and aphasia.  At baseline, patient has been bedbound requiring total care since her COVID back in October 2022 and is very hard of hearing but able to communicate.  In the ED, patient was seen as a code stroke.  CT head showed acute nonhemorrhagic ischemic infarct involving the left ACA territory.  Patient was not thrombolytic candidate.  Initial labs showed hemoglobin of 9.3 with creatinine of 2.1.  Neurology was consulted and patient was admitted to hospital for further evaluation and treatment.  Assessment and plan  Principal Problem:   CVA (cerebral vascular accident) (HCC) Active Problems:   Dysphagia   AKI (acute kidney injury) (HCC)   Constipation   Sinus bradycardia   Iron deficiency anemia   HTN (hypertension)   Hyperlipidemia with target LDL less than 70   Thrombocytosis   Acute left ACA ischemic CVA Patient with focal neurological deficits including aphasia and right-sided weakness.   CT revealed an acute nonhemorrhagic infarct involving the left ACA territory with question hyperdensity of the left pericallosal artery at the origin.  Patient was not a candidate for thrombolytics or thrombectomy.  MRI showed acute left ACA infarct. Hemoglobin A1c of 5.3. Lipid panel reviewed with elevated triglycerides and LDL of 78.  2D echocardiogram showed LV ejection fraction of 60 to 65%.  Pending PT OT speech evaluation.  On aspirin and Plavix.  Follow neurology recommendation.  Dysphagia secondary to stroke Seen by speech therapy and currently on dysphagia 1 diet with thin liquids and meds with pure.  Follow speech therapy recommendations.   Acute kidney injury Creatinine today at 2.0 from 2.3 yesterday.  Baseline creatinine around 1.0.  Received normal saline infusion.  Slightly improved  renal function.  Check urine analysis.  Continue IV fluids at 75 mm/h.  Reassess in AM.   Constipation Not had a bowel movement in several days.  Continue bowel regimen.  Sinus bradycardia Chronic.  Heart rates as low as 49 in the past with maintaining blood pressure.  If hypotension might consider intervention   Iron deficiency anemia Hemoglobin 9.3 g/dL on presentation.  Has trended down to 7.7 likely secondary to hemodilution.  No evidence of bleeding as such.  We will continue to monitor.   Essential hypertension On amlodipine 2.5 mg daily and losartan-hydrochlorothiazide 100-25 mg daily at home.  Currently on permissive hypertension.  Latest blood pressure 145/39.  Hyperlipidemia Continue Crestor.  Lipid profile reviewed.  Thrombocytosis Platelet count elevated at 428.  Likely reactive.  Will monitor.  Bedbound status, debility, deconditioning, now with new stroke and aphasia.   Discussed in length with the patient's daughter who is aware of the potential poor prognosis of the patient.  Patient is DNR at this time.  Plan is to proceed with current level of care but if patient deteriorates, focus would be on comfort care.  Palliative care has been consulted for goals of care discussion.

## 2021-07-16 NOTE — Evaluation (Signed)
Occupational Therapy Evaluation Patient Details Name: Maria CoteHilda Dickens Youtz MRN: [id number] DOB: Jun 23, 1925 Today's Date: 07/16/2021   History of Present Illness Maria Hanna is a 88 y.o. female who presents with complaints of right-sided weakness PMH: hypertension, hyperlipidemia, CVA/TIA, Parkinson's disease, CKD, and iron deficiency anemia. pt from SNF, bed bound since oct 2022 since she had COVID.   Clinical Impression   PTA, pt was at SNF and receiving assistance for ADLs and functional transfer; son providing information and reporting she was able feed herself and participate in grooming. Pt currently requiring Max-Total A for ADLs and bed mobility. Demonstrating sustained attention and following one step commands to participate in  grooming at bedlevel. Presenting with decreased attention to R, functional use of RUE, balance, strength, and activity tolerance. Pt would benefit from further acute OT to facilitate safe dc. Recommend dc to SNF for further OT to optimize safety and occupational participation as well as the needed support for ADLs.     Recommendations for follow up therapy are one component of a multi-disciplinary discharge planning process, led by the attending physician.  Recommendations may be updated based on patient status, additional functional criteria and insurance authorization.   Follow Up Recommendations  Skilled nursing-short term rehab (<3 hours/day)    Assistance Recommended at Discharge Frequent or constant Supervision/Assistance  Patient can return home with the following  (Total)    Functional Status Assessment  Patient has had a recent decline in their functional status and demonstrates the ability to make significant improvements in function in a reasonable and predictable amount of time.  Equipment Recommendations  Other (comment) (Defer to next venue)    Recommendations for Other Services       Precautions / Restrictions  Precautions Precautions: Fall      Mobility Bed Mobility Overal bed mobility: Needs Assistance Bed Mobility: Supine to Sit, Sit to Supine     Supine to sit: Total assist Sit to supine: Total assist   General bed mobility comments: Total A for initation, movement, and balance    Transfers                   General transfer comment: Defer      Balance Overall balance assessment: Needs assistance Sitting-balance support: No upper extremity supported, Feet supported Sitting balance-Leahy Scale: Zero Sitting balance - Comments: Lateral leaning to R. Additional pushing with LUE unless place on knee Postural control: Right lateral lean                                 ADL either performed or assessed with clinical judgement   ADL Overall ADL's : Needs assistance/impaired     Grooming: Wash/dry face;Bed level;Minimal assistance Grooming Details (indicate cue type and reason): Min A to support at elbow and bring hand to face.                               General ADL Comments: Max-Total A for ADLs     Vision Baseline Vision/History: 1 Wears glasses ("doesnt really wear them") Vision Assessment?: Vision impaired- to be further tested in functional context     Perception     Praxis      Pertinent Vitals/Pain Pain Assessment Pain Assessment: Faces Faces Pain Scale: No hurt Pain Intervention(s): Monitored during session, Limited activity within patient's tolerance, Repositioned     Hand Dominance  Right   Extremity/Trunk Assessment Upper Extremity Assessment Upper Extremity Assessment: RUE deficits/detail;LUE deficits/detail RUE Deficits / Details: Flaccid. No initation. Inattention RUE Coordination: decreased gross motor;decreased fine motor LUE Deficits / Details: Moving LUE against gravity to bring wash cloth to face. Able to perform AAROM to reach over head. Decreased grasp strength LUE Coordination: decreased gross  motor;decreased fine motor   Lower Extremity Assessment Lower Extremity Assessment: Defer to PT evaluation   Cervical / Trunk Assessment Cervical / Trunk Assessment: Kyphotic;Other exceptions Cervical / Trunk Exceptions: Inattention to R and pushing   Communication Communication Communication: Receptive difficulties;Expressive difficulties (Not verbalizing)   Cognition Arousal/Alertness: Awake/alert Behavior During Therapy: Flat affect (Occasional change in facial expression in responce to questions) Overall Cognitive Status: Difficult to assess                                 General Comments: Following simple commands such as "grab my hand" or wash your face". Difficulty assessing fully as pt non verablizing. some facial expression changes     General Comments  Son, Onalee Hua, present throughout    Exercises     Shoulder Instructions      Home Living Family/patient expects to be discharged to:: Skilled nursing facility [facility] SNF)                                 Additional Comments: Son present to provide information      Prior Functioning/Environment Prior Level of Function : Needs assist             Mobility Comments: OOB with nursing ADLs Comments: Pt was feeding herself and simple grooming. Total A for dressing/bathing/toileting        OT Problem List: Decreased strength;Decreased range of motion;Decreased activity tolerance;Impaired balance (sitting and/or standing);Impaired vision/perception;Decreased coordination;Decreased cognition;Decreased safety awareness;Decreased knowledge of use of DME or AE;Decreased knowledge of precautions;Impaired UE functional use      OT Treatment/Interventions: Self-care/ADL training;Therapeutic exercise;Energy conservation;DME and/or AE instruction;Therapeutic activities;Patient/family education    OT Goals(Current goals can be found in the care plan section) Acute Rehab OT Goals Patient Stated  Goal: Return to SNF OT Goal Formulation: With family Time For Goal Achievement: 07/30/21 Potential to Achieve Goals: Fair  OT Frequency: Min 2X/week    Co-evaluation              AM-PAC OT "6 Clicks" Daily Activity     Outcome Measure Help from another person eating meals?: Total Help from another person taking care of personal grooming?: A Lot Help from another person toileting, which includes using toliet, bedpan, or urinal?: Total Help from another person bathing (including washing, rinsing, drying)?: Total Help from another person to put on and taking off regular upper body clothing?: Total Help from another person to put on and taking off regular lower body clothing?: Total 6 Click Score: 7   End of Session Nurse Communication: Mobility status;Other (comment) (Pt needs new purwick)  Activity Tolerance: Patient tolerated treatment well Patient left: in bed;with call bell/phone within reach;with bed alarm set;with family/visitor present;with nursing/sitter in room  OT Visit Diagnosis: Unsteadiness on feet (R26.81);Other abnormalities of gait and mobility (R26.89);Muscle weakness (generalized) (M62.81);Hemiplegia and hemiparesis Hemiplegia - Right/Left: Right Hemiplegia - dominant/non-dominant: Dominant Hemiplegia - caused by: Cerebral infarction                Time: 4854-6270  OT Time Calculation (min): 20 min Charges:  OT General Charges $OT Visit: 1 Visit OT Evaluation $OT Eval Moderate Complexity: 1 Mod  Estrella Alcaraz MSOT, OTR/L Acute Rehab Office: [phone]  Theodoro Grist Jedd Schulenburg 07/16/2021, 4:59 PM

## 2021-07-16 NOTE — Progress Notes (Signed)
PROGRESS NOTE    Maria Hanna  [id number] DOB: 07-Jul-1925 DOA: 07/15/2021 PCP: Cyndi Bender, PA-C    Brief Narrative:  Maria Hanna is a 88 y.o. female with past medical history of hypertension, hyperlipidemia, CVA, Parkinson's disease, CKD, iron deficiency anemia presented to the hospital with right-sided weakness and aphasia.  At baseline, patient has been bedbound requiring total care since her COVID back in October 2022 and is very hard of hearing but able to communicate.  In the ED, patient was seen as a code stroke.  CT head showed acute nonhemorrhagic ischemic infarct involving the left ACA territory.  Patient was not thrombolytic candidate.  Initial labs showed hemoglobin of 9.3 with creatinine of 2.1.  Neurology was consulted and patient was admitted to hospital for further evaluation and treatment.  Assessment and plan  Principal Problem:   CVA (cerebral vascular accident) ([place]) Active Problems:   Dysphagia   AKI (acute kidney injury) ([place])   Constipation   Sinus bradycardia   Iron deficiency anemia   HTN (hypertension)   Hyperlipidemia with target LDL less than 70   Thrombocytosis   Acute left ACA ischemic CVA Patient with focal neurological deficits including aphasia and right-sided weakness.   CT revealed an acute nonhemorrhagic infarct involving the left ACA territory with question hyperdensity of the left pericallosal artery at the origin.  Patient was not a candidate for thrombolytics or thrombectomy.  MRI showed acute left ACA infarct. Hemoglobin A1c of 5.3. Lipid panel reviewed with elevated triglycerides and LDL of 78.  2D echocardiogram showed LV ejection fraction of 60 to 65%.  Pending PT OT speech evaluation.  On aspirin and Plavix.  Follow neurology recommendation.  Dysphagia secondary to stroke Seen by speech therapy and currently on dysphagia 1 diet with thin liquids and meds with pure.  Follow speech therapy recommendations.   Acute  kidney injury Creatinine today at 2.0 from 2.3 yesterday.  Baseline creatinine around 1.0.  Received normal saline infusion.  Slightly improved renal function.  Check urine analysis.  Continue IV fluids at 75 mm/h.  Reassess in AM.   Constipation Not had a bowel movement in several days.  Continue bowel regimen.  Sinus bradycardia Chronic.  Heart rates as low as 49 in the past with maintaining blood pressure.  If hypotension might consider intervention   Iron deficiency anemia Hemoglobin 9.3 g/dL on presentation.  Has trended down to 7.7 likely secondary to hemodilution.  No evidence of bleeding as such.  We will continue to monitor.   Essential hypertension On amlodipine 2.5 mg daily and losartan-hydrochlorothiazide 100-25 mg daily at home.  Currently on permissive hypertension.  Latest blood pressure 145/39.  Hyperlipidemia Continue Crestor.  Lipid profile reviewed.  Thrombocytosis Platelet count elevated at 428.  Likely reactive.  Will monitor.  Bedbound status, debility, deconditioning, now with new stroke and aphasia.   Discussed in length with the patient's daughter who is aware of the potential poor prognosis of the patient.  Patient is DNR at this time.  Plan is to proceed with current level of care but if patient deteriorates, focus would be on comfort care.  Palliative care has been consulted for goals of care discussion.     DVT prophylaxis: heparin injection 5,000 Units Start: 07/15/21 1415   Code Status:     Code Status: DNR  Disposition: Uncertain at this time.  PT OT pending.  Status is: Inpatient  Remains inpatient appropriate because: Acute stroke, debility, deconditioning,   Family Communication: Spoke  with the patient's daughter on the phone at length  Consultants:  Neurology Palliative care  Procedures:  None  Antimicrobials:  None  Anti-infectives (From admission, onward)    None      Subjective: Today, patient was seen and examined at  bedside.  Patient is nonverbal noncommunicative.  Appears chronically ill and frail.  Objective: Vitals:   07/15/21 2332 07/16/21 0314 07/16/21 0823 07/16/21 1157  BP: (!) 175/40 (!) 150/57 (!) 145/39 (!) 122/39  Pulse: (!) 55 91 60 72  Resp: 16 16 16 16   Temp: 98.6 F (37 C) 99.2 F (37.3 C) 98.5 F (36.9 C) 98.5 F (36.9 C)  TempSrc: Oral Oral Oral Oral  SpO2: 96% 100% 98% 96%  Weight:       No intake or output data in the 24 hours ending 07/16/21 1304 Filed Weights   07/15/21 1100  Weight: 49 kg    Physical Examination: Body mass index is 20.41 kg/m.   General:  Average built, not in obvious distress, nonverbal, elderly female, appears ill and frail, aphasic HENT:   No scleral pallor or icterus noted. Oral mucosa is moist.  Chest:  Clear breath sounds.  Diminished breath sounds bilaterally. No crackles or wheezes.  CVS: S1 &S2 heard. No murmur.  Regular rate and rhythm. Abdomen: Soft, nontender, nondistended.  Bowel sounds are heard.   Extremities: Right upper extremity weakness noted.  Right facial droop.  Peripheral pulses are palpable. Psych: Nonverbal, noncommunicative, aphasic CNS: Right facial droop, right upper extremity weakness, aphasia Skin: Warm and dry.  No rashes noted.  Data Reviewed:   CBC: Recent Labs  Lab 07/15/21 1100 07/15/21 1110 07/16/21 0232  WBC 8.6  --  11.7*  NEUTROABS 6.5  --   --   HGB 9.3* 9.5* 7.7*  HCT 30.9* 28.0* 26.2*  MCV 96.3  --  94.6  PLT 402*  --  428*    Basic Metabolic Panel: Recent Labs  Lab 07/15/21 1110 07/15/21 1235 07/16/21 0232  NA 135 138 137  K 7.2* 3.9 3.8  CL 105 105 107  CO2  --  23 23  GLUCOSE 107* 128* 127*  BUN 66* 46* 45*  CREATININE 2.30* 2.18* 2.00*  CALCIUM  --  9.2 8.7*    Liver Function Tests: Recent Labs  Lab 07/15/21 1235  AST 14*  ALT 11  ALKPHOS 88  BILITOT 0.6  PROT 7.0  ALBUMIN 2.4*     Radiology Studies: MR ANGIO HEAD WO CONTRAST  Result Date: 07/15/2021 CLINICAL  DATA:  Stroke, follow-up. Last seen normal 5 hours before admission. Mental status changes. Abnormal head CT. EXAM: MRI HEAD WITHOUT CONTRAST MRA HEAD WITHOUT CONTRAST MRA NECK WITHOUT CONTRAST TECHNIQUE: Multiplanar, multiecho pulse sequences of the brain and surrounding structures were obtained without intravenous contrast. Angiographic images of the Circle of Willis were obtained using MRA technique without intravenous contrast. Angiographic images of the neck were obtained using MRA technique without intravenous contrast. Carotid stenosis measurements (when applicable) are obtained utilizing NASCET criteria, using the distal internal carotid diameter as the denominator. COMPARISON:  Head CT earlier same day.  MRI 04/19/2009 FINDINGS: MRI HEAD FINDINGS Brain: Diffusion imaging shows acute infarction of the left frontal lobe and left genu of the corpus callosum consistent with anterior cerebral artery distribution infarction. Mild swelling but no visible hemorrhage. Elsewhere, brainstem and cerebellum are normal. Cerebral hemispheres are otherwise normal without background small-vessel disease, extraordinary at this age. No hydrocephalus. No extra-axial collection. Vascular: Abnormal flow appearance of  the left internal carotid artery. Skull and upper cervical spine: Negative Sinuses/Orbits: Normal Other: Right mastoid effusion. MRA HEAD FINDINGS Right internal carotid artery is patent through the skull base and siphon regions. Right anterior and middle cerebral vessels are patent. No flow seen in the left anterior cerebral artery. No antegrade flow of the ICA through the skull base. Large patent posterior communicating artery gives supply to the left MCA territory. Non dominant right vertebral artery serves PICA and gives a tiny contribution to the basilar. Dominant left vertebral artery widely patent to the basilar. No basilar stenosis. Posterior circulation branch vessels are patent. Patent left PCA as noted.  MRA NECK FINDINGS Right common carotid artery widely patent to the bifurcation. 20% stenosis of the internal carotid artery at the bifurcation level but wide patency beyond that. There is stenosis of the ECA. Left common carotid artery is occluded at the origin. There is a patent left external carotid artery. Both vertebral arteries are patent through the cervical region showing antegrade flow. The left is dominant. IMPRESSION: Acute infarction in the left anterior cerebral artery territory. Mild swelling but no hemorrhage. Otherwise normal appearance of the brain. 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 Left external carotid artery continues to show flow. No flow in the ICA at the skull base. Blood flow to the left hemisphere occurs through a patent posterior communicating artery, supplying the left MCA territory. No flow seen in the left anterior cerebral artery. Electronically Signed   By: Paulina Fusi M.D.   On: 07/15/2021 18:28   MR ANGIO NECK WO CONTRAST  Result Date: 07/15/2021 CLINICAL DATA:  Stroke, follow-up. Last seen normal 5 hours before admission. Mental status changes. Abnormal head CT. EXAM: MRI HEAD WITHOUT CONTRAST MRA HEAD WITHOUT CONTRAST MRA NECK WITHOUT CONTRAST TECHNIQUE: Multiplanar, multiecho pulse sequences of the brain and surrounding structures were obtained without intravenous contrast. Angiographic images of the Circle of Willis were obtained using MRA technique without intravenous contrast. Angiographic images of the neck were obtained using MRA technique without intravenous contrast. Carotid stenosis measurements (when applicable) are obtained utilizing NASCET criteria, using the distal internal carotid diameter as the denominator. COMPARISON:  Head CT earlier same day.  MRI 04/19/2009 FINDINGS: MRI HEAD FINDINGS Brain: Diffusion imaging shows acute infarction of the left frontal lobe and left genu of the corpus callosum consistent with anterior cerebral artery  distribution infarction. Mild swelling but no visible hemorrhage. Elsewhere, brainstem and cerebellum are normal. Cerebral hemispheres are otherwise normal without background small-vessel disease, extraordinary at this age. No hydrocephalus. No extra-axial collection. Vascular: Abnormal flow appearance of the left internal carotid artery. Skull and upper cervical spine: Negative Sinuses/Orbits: Normal Other: Right mastoid effusion. MRA HEAD FINDINGS Right internal carotid artery is patent through the skull base and siphon regions. Right anterior and middle cerebral vessels are patent. No flow seen in the left anterior cerebral artery. No antegrade flow of the ICA through the skull base. Large patent posterior communicating artery gives supply to the left MCA territory. Non dominant right vertebral artery serves PICA and gives a tiny contribution to the basilar. Dominant left vertebral artery widely patent to the basilar. No basilar stenosis. Posterior circulation branch vessels are patent. Patent left PCA as noted. MRA NECK FINDINGS Right common carotid artery widely patent to the bifurcation. 20% stenosis of the internal carotid artery at the bifurcation level but wide patency beyond that. There is stenosis of the ECA. Left common carotid artery is occluded at the origin. There is  a patent left external carotid artery. Both vertebral arteries are patent through the cervical region showing antegrade flow. The left is dominant. IMPRESSION: Acute infarction in the left anterior cerebral artery territory. Mild swelling but no hemorrhage. Otherwise normal appearance of the brain. 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 Left external carotid artery continues to show flow. No flow in the ICA at the skull base. Blood flow to the left hemisphere occurs through a patent posterior communicating artery, supplying the left MCA territory. No flow seen in the left anterior cerebral artery. Electronically Signed    By: Nelson Chimes M.D.   On: 07/15/2021 18:25   MR BRAIN WO CONTRAST  Result Date: 07/15/2021 CLINICAL DATA:  Stroke, follow-up. Last seen normal 5 hours before admission. Mental status changes. Abnormal head CT. EXAM: MRI HEAD WITHOUT CONTRAST MRA HEAD WITHOUT CONTRAST MRA NECK WITHOUT CONTRAST TECHNIQUE: Multiplanar, multiecho pulse sequences of the brain and surrounding structures were obtained without intravenous contrast. Angiographic images of the Circle of Willis were obtained using MRA technique without intravenous contrast. Angiographic images of the neck were obtained using MRA technique without intravenous contrast. Carotid stenosis measurements (when applicable) are obtained utilizing NASCET criteria, using the distal internal carotid diameter as the denominator. COMPARISON:  Head CT earlier same day.  MRI 04/19/2009 FINDINGS: MRI HEAD FINDINGS Brain: Diffusion imaging shows acute infarction of the left frontal lobe and left genu of the corpus callosum consistent with anterior cerebral artery distribution infarction. Mild swelling but no visible hemorrhage. Elsewhere, brainstem and cerebellum are normal. Cerebral hemispheres are otherwise normal without background small-vessel disease, extraordinary at this age. No hydrocephalus. No extra-axial collection. Vascular: Abnormal flow appearance of the left internal carotid artery. Skull and upper cervical spine: Negative Sinuses/Orbits: Normal Other: Right mastoid effusion. MRA HEAD FINDINGS Right internal carotid artery is patent through the skull base and siphon regions. Right anterior and middle cerebral vessels are patent. No flow seen in the left anterior cerebral artery. No antegrade flow of the ICA through the skull base. Large patent posterior communicating artery gives supply to the left MCA territory. Non dominant right vertebral artery serves PICA and gives a tiny contribution to the basilar. Dominant left vertebral artery widely patent to the  basilar. No basilar stenosis. Posterior circulation branch vessels are patent. Patent left PCA as noted. MRA NECK FINDINGS Right common carotid artery widely patent to the bifurcation. 20% stenosis of the internal carotid artery at the bifurcation level but wide patency beyond that. There is stenosis of the ECA. Left common carotid artery is occluded at the origin. There is a patent left external carotid artery. Both vertebral arteries are patent through the cervical region showing antegrade flow. The left is dominant. IMPRESSION: Acute infarction in the left anterior cerebral artery territory. Mild swelling but no hemorrhage. Otherwise normal appearance of the brain. 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 Left external carotid artery continues to show flow. No flow in the ICA at the skull base. Blood flow to the left hemisphere occurs through a patent posterior communicating artery, supplying the left MCA territory. No flow seen in the left anterior cerebral artery. Electronically Signed   By: Nelson Chimes M.D.   On: 07/15/2021 17:57   DG ABD ACUTE 2+V W 1V CHEST  Result Date: 07/15/2021 CLINICAL DATA:  Constipation. EXAM: DG ABDOMEN ACUTE WITH 1 VIEW CHEST COMPARISON:  May 31, 2021 FINDINGS: There is no evidence of dilated bowel loops or free intraperitoneal air. A moderate stool burden  is noted. No radiopaque calculi are identified. Numerous subcentimeter gallstones are seen overlying the right upper quadrant. Heart size and mediastinal contours are within normal limits. Mild to moderate severity atelectasis and/or infiltrate is seen within the left lung base. There is a small left pleural effusion. IMPRESSION: 1. Moderate stool burden without evidence of bowel obstruction. 2. Mild to moderate severity left basilar atelectasis and/or infiltrate. 3. Small left pleural effusion. 4. Cholelithiasis. Electronically Signed   By: Virgina Norfolk M.D.   On: 07/15/2021 20:08   ECHOCARDIOGRAM  COMPLETE  Result Date: 07/15/2021    ECHOCARDIOGRAM REPORT   Patient Name:   ALFREDIA PETTI Date of Exam: 07/15/2021 Medical Rec #:  [id number]             Height:       61.0 in Accession #:    [id number]            Weight:       108.0 lb Date of Birth:  08-04-1925             BSA:          1.454 m Patient Age:    70 years              BP:           155/42 mmHg Patient Gender: F                     HR:           53 bpm. Exam Location:  Inpatient Procedure: 2D Echo, Cardiac Doppler and Color Doppler Indications:    Stroke  History:        Patient has prior history of Echocardiogram examinations, most                 recent 05/02/2013. Risk Factors:Hypertension and HLD.  Sonographer:    Joette Catching RCS Referring Phys: [id number] RONDELL A SMITH IMPRESSIONS  1. Left ventricular ejection fraction, by estimation, is 60 to 65%. The left ventricle has normal function. The left ventricle has no regional wall motion abnormalities. Left ventricular diastolic parameters are indeterminate.  2. Right ventricular systolic function is normal. The right ventricular size is normal.  3. The mitral valve is normal in structure. Mild mitral valve regurgitation. No evidence of mitral stenosis.  4. The aortic valve is calcified. Aortic valve regurgitation is mild. Severe aortic valve stenosis. Aortic regurgitation PHT measures 596 msec. Aortic valve area, by VTI measures 0.99 cm. Aortic valve mean gradient measures 45.4 mmHg. Aortic valve Vmax  measures 4.44 m/s.  5. The inferior vena cava is normal in size with greater than 50% respiratory variability, suggesting right atrial pressure of 3 mmHg. FINDINGS  Left Ventricle: Left ventricular ejection fraction, by estimation, is 60 to 65%. The left ventricle has normal function. The left ventricle has no regional wall motion abnormalities. The left ventricular internal cavity size was normal in size. There is  no left ventricular hypertrophy. Left ventricular diastolic parameters are  indeterminate. Right Ventricle: The right ventricular size is normal. No increase in right ventricular wall thickness. Right ventricular systolic function is normal. Left Atrium: Left atrial size was normal in size. Right Atrium: Right atrial size was normal in size. Pericardium: There is no evidence of pericardial effusion. Presence of epicardial fat layer. Mitral Valve: The mitral valve is normal in structure. Mild to moderate mitral annular calcification. Mild mitral valve regurgitation. No evidence of mitral valve stenosis. Tricuspid Valve:  The tricuspid valve is normal in structure. Tricuspid valve regurgitation is not demonstrated. No evidence of tricuspid stenosis. Aortic Valve: The aortic valve is calcified. Aortic valve regurgitation is mild. Aortic regurgitation PHT measures 596 msec. Severe aortic stenosis is present. Aortic valve mean gradient measures 45.4 mmHg. Aortic valve peak gradient measures 78.8 mmHg. Aortic valve area, by VTI measures 0.99 cm. Pulmonic Valve: The pulmonic valve was normal in structure. Pulmonic valve regurgitation is trivial. No evidence of pulmonic stenosis. Aorta: The aortic root is normal in size and structure. Venous: The inferior vena cava is normal in size with greater than 50% respiratory variability, suggesting right atrial pressure of 3 mmHg. IAS/Shunts: No atrial level shunt detected by color flow Doppler.  LEFT VENTRICLE PLAX 2D LVIDd:         5.20 cm   Diastology LVIDs:         2.90 cm   LV e' medial:    3.92 cm/s LV PW:         1.00 cm   LV E/e' medial:  20.4 LV IVS:        1.00 cm   LV e' lateral:   3.59 cm/s LVOT diam:     2.00 cm   LV E/e' lateral: 22.3 LV SV:         102 LV SV Index:   70 LVOT Area:     3.14 cm  RIGHT VENTRICLE             IVC RV Basal diam:  2.60 cm     IVC diam: 1.30 cm RV S prime:     18.90 cm/s TAPSE (M-mode): 1.6 cm LEFT ATRIUM             Index        RIGHT ATRIUM          Index LA diam:        3.90 cm 2.68 cm/m   RA Area:     9.16 cm  LA Vol (A2C):   26.7 ml 18.37 ml/m  RA Volume:   16.70 ml 11.49 ml/m LA Vol (A4C):   34.2 ml 23.53 ml/m LA Biplane Vol: 30.5 ml 20.98 ml/m  AORTIC VALVE                     PULMONIC VALVE AV Area (Vmax):    0.93 cm      PV Vmax:       1.42 m/s AV Area (VTI):     0.99 cm      PV Peak grad:  8.1 mmHg AV Vmax:           443.80 cm/s AV Vmean:          316.000 cm/s AV VTI:            1.032 m AV Peak Grad:      78.8 mmHg AV Mean Grad:      45.4 mmHg LVOT Vmax:         131.00 cm/s LVOT VTI:          0.326 m LVOT/AV VTI ratio: 0.32 AI PHT:            596 msec  AORTA Ao Root diam: 3.00 cm MITRAL VALVE                TRICUSPID VALVE MV Area (PHT): 2.38 cm     TR Peak grad:   10.6 mmHg MV Decel Time: 319 msec  TR Vmax:        163.00 cm/s MR Peak grad: 179.0 mmHg MR Vmax:      669.00 cm/s   SHUNTS MV E velocity: 79.90 cm/s   Systemic VTI:  0.33 m MV A velocity: 134.00 cm/s  Systemic Diam: 2.00 cm MV E/A ratio:  0.60 Kardie Tobb DO Electronically signed by Berniece Salines DO Signature Date/Time: 07/15/2021/5:15:17 PM    Final    CT HEAD CODE STROKE WO CONTRAST  Result Date: 07/15/2021 CLINICAL DATA:  Code stroke. Neuro deficit, acute, stroke suspected. Right-sided weakness and facial droop. Last known well 6 o'clock a.m. today. EXAM: CT HEAD WITHOUT CONTRAST TECHNIQUE: Contiguous axial images were obtained from the base of the skull through the vertex without intravenous contrast. RADIATION DOSE REDUCTION: This exam was performed according to the departmental dose-optimization program which includes automated exposure control, adjustment of the mA and/or kV according to patient size and/or use of iterative reconstruction technique. COMPARISON:  CT head without contrast 05/31/2021 at [facility]. FINDINGS: Brain: Acute nonhemorrhagic infarct is present the left ACA territory with loss of gray-white differentiation in some effacement of the sulci. MCA territory is unremarkable. Basal ganglia are intact. Insular ribbon  is normal. No acute or focal right hemisphere lesions are present. The brainstem and cerebellum are within normal limits. Vascular: Atherosclerotic calcifications are present within the cavernous internal carotid arteries bilaterally and the dural margin of the left vertebral artery. Proximal left A3 hyperdensity suspected. Correlate with CTA. Atherosclerotic calcifications are also present at the Skull: Calvarium is intact. No focal lytic or blastic lesions are present. No significant extracranial soft tissue lesion is present. Sinuses/Orbits: The paranasal sinuses and mastoid air cells are clear. Bilateral lens replacements are noted. Globes and orbits are otherwise unremarkable. ASPECTS [facility] Stroke Program Early CT Score) - Ganglionic level infarction (caudate, lentiform nuclei, internal capsule, insula, M1-M3 cortex): 7/7 - Supraganglionic infarction (M4-M6 cortex): 3/3 Total score (0-10 with 10 being normal): 10/10 IMPRESSION: 1. Acute nonhemorrhagic infarct involving the left ACA territory. 2. Question hyperdensity of the left pericallosal artery at its origin. 3. ASPECTS is 10/10. The above was relayed via text pager to Dr. Leonel Ramsay on 07/15/2021 at 11:18 . Electronically Signed   By: San Morelle M.D.   On: 07/15/2021 11:18      LOS: 1 day    Flora Lipps, MD [facility] Available via Epic secure chat 7am-7pm After these hours, please refer to coverage provider listed on [email or web address] 07/16/2021, 1:04 PM

## 2021-07-16 NOTE — Progress Notes (Signed)
Speech Language Pathology Treatment: Dysphagia  Patient Details Name: Maria Hanna MRN: [id number] DOB: [birth year] Today's Date: 07/16/2021 Time: 7673-4193 SLP Time Calculation (min) (ACUTE ONLY): 20 min  Assessment / Plan / Recommendation Clinical Impression  Despite size of stoke, pt is doing remarkably well eating/drinking.  She demonstrates mild oral delays but no s/s of aspiration and tolerated breakfast quite well when fed by her daughter. Recommend continuing current diet of dysphagia 1, thin liquids.  If GOC become comfort-focused, would advance diet to allow foods that bring [name] the most pleasure. Her daughter said she loves milkshakes, so encouraged her to bring her favorite flavor. SLP will following pending plan of care.   HPI HPI: Maria Hanna is a 88 y.o. female who presented by EMS from SNF with new onset aphasia, RUE weakness and right facial asymmetry. Reportedly is verbal and interactive at baseline.  MRI showed acute infarction of the left frontal  lobe and left genu of the corpus callosum consistent with anterior  cerebral artery distribution infarction. PMHx HTN, HLD, CKD, stroke.      SLP Plan  Continue with current plan of care  Patient needs continued Speech Lanaguage Pathology Services   Recommendations for follow up therapy are one component of a multi-disciplinary discharge planning process, led by the attending physician.  Recommendations may be updated based on patient status, additional functional criteria and insurance authorization.    Recommendations  Diet recommendations: Dysphagia 1 (puree);Thin liquid Liquids provided via: Cup;Straw Medication Administration: Whole meds with puree Supervision: Staff to assist with self feeding Compensations: Minimize environmental distractions Postural Changes and/or Swallow Maneuvers: Seated upright 90 degrees                Oral Care Recommendations: Oral care QID Follow Up Recommendations:  Skilled nursing-short term rehab (<3 hours/day) Assistance recommended at discharge: Frequent or constant Supervision/Assistance SLP Visit Diagnosis: Dysphagia, unspecified (R13.10) Plan: Continue with current plan of care         Sakiyah Shur L. Samson Frederic, MA CCC/SLP Acute Rehabilitation Services Office number [phone] Pager [phone]   [name]  07/16/2021, 11:10 AM

## 2021-07-17 DIAGNOSIS — Z515 Encounter for palliative care: Secondary | ICD-10-CM

## 2021-07-17 DIAGNOSIS — I63522 Cerebral infarction due to unspecified occlusion or stenosis of left anterior cerebral artery: Secondary | ICD-10-CM | POA: Diagnosis not present

## 2021-07-17 LAB — BASIC METABOLIC PANEL
Anion gap: 9 (ref 5–15)
BUN: 49 mg/dL — ABNORMAL HIGH (ref 8–23)
CO2: 24 mmol/L (ref 22–32)
Calcium: 8.1 mg/dL — ABNORMAL LOW (ref 8.9–10.3)
Chloride: 107 mmol/L (ref 98–111)
Creatinine, Ser: 1.68 mg/dL — ABNORMAL HIGH (ref 0.44–1.00)
GFR, Estimated: 28 mL/min — ABNORMAL LOW (ref >60)
Glucose, Bld: 114 mg/dL — ABNORMAL HIGH (ref 70–99)
Potassium: 4.1 mmol/L (ref 3.5–5.1)
Sodium: 140 mmol/L (ref 135–145)

## 2021-07-17 LAB — CBC
HCT: 24.1 % — ABNORMAL LOW (ref 36.0–46.0)
Hemoglobin: 7.1 g/dL — ABNORMAL LOW (ref 12.0–15.0)
MCH: 27.7 pg (ref 26.0–34.0)
MCHC: 29.5 g/dL — ABNORMAL LOW (ref 30.0–36.0)
MCV: 94.1 fL (ref 80.0–100.0)
Platelets: 355 10*3/uL (ref 150–400)
RBC: 2.56 MIL/uL — ABNORMAL LOW (ref 3.87–5.11)
RDW: 16.5 % — ABNORMAL HIGH (ref 11.5–15.5)
WBC: 10.9 10*3/uL — ABNORMAL HIGH (ref 4.0–10.5)
nRBC: 0 % (ref 0.0–0.2)

## 2021-07-17 LAB — MAGNESIUM: Magnesium: 2.3 mg/dL (ref 1.7–2.4)

## 2021-07-17 MED ORDER — TICAGRELOR 90 MG PO TABS: 90.0000 mg | ORAL_TABLET | Freq: Two times a day (BID) | ORAL | 0 refills | Status: AC

## 2021-07-17 MED ORDER — MIRTAZAPINE 7.5 MG PO TABS: 7.5000 mg | ORAL_TABLET | Freq: Every day | ORAL | 0 refills | Status: AC

## 2021-07-17 NOTE — Consult Note (Signed)
   [facility] Inpatient Consult   07/17/2021  Maria Hanna 01-19-1926 [id number]  [facility] [[facility]]  Accountable Care Organization [ACO] Patient: Medicare ACO REACH  Primary Care Provider:  Arlyss Queen, with [facility] an independent Embedded provider   If the patient goes to a [facility] affiliated facility then, patient can be followed by [facility] [facility] [[facility]] Care Management [facility] RN with traditional Medicare.    Plan:   [facility] PAC RN can follow for any known or needs for transitional care needs for returning to post facility care or complex disease management.  For questions or referrals, please contact:   Charlesetta Shanks, RN BSN CCM [facility]  [phone] business mobile phone Toll free office [phone]  Fax number: [phone] [email or web address]

## 2021-07-17 NOTE — NC FL2 (Signed)
[place] LEVEL OF CARE SCREENING TOOL     IDENTIFICATION  Patient Name: Maria Hanna Birthdate: 1925/04/11 Sex: female Admission Date (Current Location): 07/15/2021  [facility] and [place] Number:  [employer] and Address:  [facility] [place], [place] [address], [place], [place] [address]      Provider Number: [id number]  Attending Physician Name and Address:  Nita Sells, MD  Relative Name and Phone Number:       Current Level of Care: Hospital Recommended Level of Care: [place] Prior Approval Number:    Date Approved/Denied:   PASRR Number:    Discharge Plan: SNF    Current Diagnoses: Patient Active Problem List   Diagnosis Date Noted   Acute ischemic left ACA stroke ([place]) 07/15/2021   Dysphagia 07/15/2021   AKI (acute kidney injury) ([place]) 07/15/2021   Constipation 07/15/2021   Iron deficiency anemia 07/15/2021   Sinus bradycardia 07/15/2021   Thrombocytosis 07/15/2021   Anemia in chronic kidney disease 08/28/2020   Aortic sclerosis 04/25/2013   Ankle edema 04/25/2013   Carotid stenosis 01/13/2013   HTN (hypertension) 01/13/2013   Hyperlipidemia with target LDL less than 70 01/13/2013   GERD (gastroesophageal reflux disease) 01/13/2013   Anemia 01/13/2013    Orientation RESPIRATION BLADDER Height & Weight      (unable to assess)  Normal Incontinent Weight: 108 lb 0.4 oz (49 kg) Height:     BEHAVIORAL SYMPTOMS/MOOD NEUROLOGICAL BOWEL NUTRITION STATUS      Incontinent Diet (see DC summary)  AMBULATORY STATUS COMMUNICATION OF NEEDS Skin   Extensive Assist Non-Verbally Normal                       Personal Care Assistance Level of Assistance  Bathing, Feeding, Dressing Bathing Assistance: Maximum assistance Feeding assistance: Maximum assistance Dressing Assistance: Maximum assistance     Functional Limitations Info  Speech     Speech Info: Impaired (nonverbal)    SPECIAL  CARE FACTORS FREQUENCY                       Contractures Contractures Info: Not present    Additional Factors Info  Code Status, Allergies, Psychotropic Code Status Info: DNR Allergies Info: Atorvastatin Psychotropic Info: Remeron 7.5mg  daily at bed         Current Medications (07/17/2021):  This is the current hospital active medication list Current Facility-Administered Medications  Medication Dose Route Frequency Provider Last Rate Last Admin   0.9 %  sodium chloride infusion   Intravenous Continuous Fuller Plan A, MD 75 mL/hr at 07/17/21 0910 New Bag at 07/17/21 0910   acetaminophen (TYLENOL) tablet 650 mg  650 mg Oral Q4H PRN Norval Morton, MD       Or   acetaminophen (TYLENOL) 160 MG/5ML solution 650 mg  650 mg Per Tube Q4H PRN Fuller Plan A, MD       Or   acetaminophen (TYLENOL) suppository 650 mg  650 mg Rectal Q4H PRN Fuller Plan A, MD       aspirin chewable tablet 81 mg  81 mg Oral Daily Tamala Julian, Rondell A, MD   81 mg at 07/17/21 0910   heparin injection 5,000 Units  5,000 Units Subcutaneous Q8H Fuller Plan A, MD   5,000 Units at 07/17/21 0517   hydrALAZINE (APRESOLINE) injection 5 mg  5 mg Intravenous Q4H PRN Norval Morton, MD       magnesium  oxide (MAG-OX) tablet 400 mg  400 mg Oral q morning Tamala Julian, Rondell A, MD   400 mg at 07/17/21 0910   mirtazapine (REMERON) tablet 7.5 mg  7.5 mg Oral QHS Smith, Rondell A, MD   7.5 mg at 07/16/21 2130   pantoprazole (PROTONIX) EC tablet 40 mg  40 mg Oral q morning Tamala Julian, Rondell A, MD   40 mg at 07/17/21 0910   polyethylene glycol (MIRALAX / GLYCOLAX) packet 17 g  17 g Oral Daily PRN Fuller Plan A, MD   17 g at 07/16/21 0917   protein supplement (ENSURE MAX) liquid  1 each Oral BID WC Smith, Rondell A, MD   330 mL at 07/16/21 1329   rosuvastatin (CRESTOR) tablet 5 mg  5 mg Oral q morning Fuller Plan A, MD   5 mg at 07/17/21 0910   senna-docusate (Senokot-S) tablet 1 tablet  1 tablet Oral BID Fuller Plan  A, MD   1 tablet at 07/17/21 0910   ticagrelor (BRILINTA) tablet 90 mg  90 mg Oral BID de Yolanda Manges, Cortney E, NP   90 mg at 07/17/21 0910   Zinc Oxide (TRIPLE PASTE) 12.8 % ointment 1 application.  1 application. Topical Q8H PRN Norval Morton, MD         Discharge Medications: Please see discharge summary for a list of discharge medications.  Relevant Imaging Results:  Relevant Lab Results:   Additional Information SS#: [id number]  Geralynn Ochs, LCSW

## 2021-07-17 NOTE — Consult Note (Signed)
Palliative Medicine Inpatient Consult Note  Consulting Provider: Flora Lipps, MD  Reason for consult:   [place] Palliative Medicine Consult  Reason for Consult? goals of care discussion, bedbound patient with a new stroke, elderly and frail,   07/17/2021  HPI:  Per intake H&P --> Maria Hanna is a 88 y.o. female with past medical history of hypertension, hyperlipidemia, CVA, Parkinson's disease, CKD, iron deficiency anemia presented to the hospital with right-sided weakness and aphasia.  At baseline, patient has been bedbound requiring total care since her COVID back in October 2022 and is very hard of hearing but able to communicate. S/P acute nonhemorrhagic ischemic infarct involving the left ACA territory. Palliative care has been asked to get involved for further goals of care conversations.   Clinical Assessment/Goals of Care:  *Please note that this is a verbal dictation therefore any spelling or grammatical errors are due to the "[place]" system interpretation.  I have reviewed medical records including EPIC notes, labs and imaging, received report from bedside RN, assessed the patient.    I called Maria Hanna on the phone and met her spouse at bedside to further discuss diagnosis prognosis, GOC, EOL wishes, disposition and options.   I introduced Palliative Medicine as specialized medical care for people living with serious illness. It focuses on providing relief from the symptoms and stress of a serious illness. The goal is to improve quality of life for both the patient and the family.  Medical History Review and Understanding:  Chart review of h/o hypertension, hyperlipidemia, CVA, Parkinson's disease.  Maria Hanna has been declining per her daughter after having experienced COVID last year. She thereafter had a UTI which caused a further decline in her functional state. We reviewed that she now is admitted with a new stroke caused  right sided weakness. She is also experiencing tremendous difficulty with speech/communication and how much recovery will occur is not known.   Social History:  Maria Hanna is from [place], [place]. She is a widow. She has been   Functional and Nutritional State:  Was active living on her own until Fall '22 when she was afflicted by Covid and unable to care for herself. She moved to [employer] where she is able to brush her hair and feed herself though is otherwise bedbound.   Maria Hanna has had a variable appetite in the community.   Advance Directives:  A detailed discussion was had today regarding advanced directives.  Patient son and daughter help with decision making.   Code Status:  Maria Hanna is an established DNAR/DNI  Discussion:  Reviewed the Maria Hanna declining health state since October 2022. She has never quite gotten back to [facility] and per her son-in-law has continued to have events occur which has contributed greatly to her decline.   We reviewed the various paths which could be traveled considering this as below:  Skilled care is nursing and therapy care that can only be safely and effectively performed by, or under the supervision of, professionals or Metallurgist. It's health care given when you need skilled nursing or skilled therapy to treat, manage, and observe your condition, and evaluate your care. In a skilled nursing facility you'll receive one or more therapies for an average of one to two hours per day. This includes physical, occupational, and speech therapy. The therapies are not considered intensive.   Palliative care is specialized medical care for people living with a serious illness, such as cancer, heart failure, COPD, Alzheimer's dementia,  etc. Patients in palliative care may receive medical care for their symptoms, or palliative care, along with treatment intended to cure their serious illness   Hospice care focuses on the care, comfort, and quality of life  of a person with a serious illness who is approaching the end of life. At some point, it may not be possible to cure a serious illness, or a patient may choose not to undergo certain treatments. Hospice is designed for this situation.  Maria Hanna does not feel family is at a point whereby hospice is the next step though she is willing to accept outpatient Palliative support once Maria Hanna transitions to Maria Hanna.  Discussed the importance of continued conversation with family and their  medical providers regarding overall plan of care and treatment options, ensuring decisions are within the context of the patients values and GOCs.  Decision Maker: Maria Hanna (Daughter) [phone]  SUMMARY OF RECOMMENDATIONS   DNAR/DNI  Plan for transition to [facility] SNF  TOC - OP Palliative support  Ongoing Palliative care support  Code Status/Advance Care Planning: DNAR/DNI  Palliative Prophylaxis:  Aspiration, Bowel Regimen, Delirium Protocol, Frequent Pain Assessment, Oral Care, Palliative Wound Care, and Turn Reposition  Additional Recommendations (Limitations, Scope, Preferences): Continue current care  Psycho-social/Spiritual:  Desire for further Chaplaincy support: Continue current care Additional Recommendations: Education on chronic disease processes.    Prognosis: Limited would meet hospice criteria  Discharge Planning: Discharge to SNF.  Vitals:   07/16/21 2324 07/17/21 0348  BP: (!) 159/52 (!) 146/51  Pulse:  78  Resp:  17  Temp: 98.4 F (36.9 C) 99.8 F (37.7 C)  SpO2: 97% 94%    Intake/Output Summary (Last 24 hours) at 07/17/2021 0645 Last data filed at 07/17/2021 0500 Gross per 24 hour  Intake 1838.1 ml  Output 850 ml  Net 988.1 ml   Last Weight  Most recent update: 07/15/2021 11:07 AM    Weight  49 kg (108 lb 0.4 oz)            Gen: Frail elderly caucasian F in NAD HEENT: moist mucous membranes CV: Regular rate and rhythm PULM:ON RA ABD: soft/nontender EXT: No  edema Neuro: Disoriented  PPS: 10%   This conversation/these recommendations were discussed with patient primary care team, Dr. Verlon Au  Billing based on MDM: High  Problems Addressed: One acute or chronic illness or injury that poses a threat to life or bodily function  Amount and/or Complexity of Data: Category 3:Discussion of management or test interpretation with external physician/other qualified health care professional/appropriate source (not separately reported)  Risks: Decision not to resuscitate or to de-escalate care because of poor prognosis ______________________________________________________ [place] Team Team Cell Phone: [phone] Please utilize secure chat with additional questions, if there is no response within 30 minutes please call the above phone number  Palliative Medicine Team providers are available by phone from 7am to 7pm daily and can be reached through the team cell phone.  Should this patient require assistance outside of these hours, please call the patient's attending physician.

## 2021-07-17 NOTE — TOC Transition Note (Signed)
Transition of Care [facility]) - CM/SW Discharge Note   Patient Details  Name: Maria Hanna MRN: [id number] Date of Birth: 1925-04-07  [facility] CM/SW Contact:  Baldemar Lenis, LCSW Phone Number: 07/17/2021, 11:21 AM   Clinical Narrative:   CSW alerted by MD of discharge back to SNF today. CSW sent discharge paperwork to SNF and alerted that palliative had not seen, would likely be beneficial for patient upon return. Transport arranged with [facility] for next available.  Nurse to call report to [phone].    Final next level of care: Skilled Nursing Facility Barriers to Discharge: Barriers Resolved   Patient Goals and CMS Choice Patient states their goals for this hospitalization and ongoing recovery are:: patient unable to participate in goal setting, not oriented CMS Medicare.gov Compare Post Acute Care list provided to:: Patient Represenative (must comment) Choice offered to / list presented to : Adult Children  Discharge Placement              Patient chooses bed at:  [facility]) Patient to be transferred to facility by: [facility] Name of family member notified: Son in Social worker at bedside Patient and family notified of of transfer: 07/17/21  Discharge Plan and Services                                     Social Determinants of Health (SDOH) Interventions     Readmission Risk Interventions     View : No data to display.

## 2021-07-17 NOTE — Discharge Summary (Signed)
Physician Discharge Summary  Maria Hanna ZOX:[id number] DOB: 1926-02-04 DOA: 07/15/2021  PCP: Lonie Peak, PA-C  Admit date: 07/15/2021 Discharge date: 07/17/2021  Time spent: 36 minutes  Recommendations for Outpatient Follow-up:  Needs aspirin 81+ Brilinta twice daily for 4 weeks and then aspirin alone after 4 weeks or over Recommend return to [facility] for skilled nursing care for large stroke Recommend Chem-12 CBC in about 1 week If fails to thrive recommend initiation of palliative/hospice care plan  Discharge Diagnoses:  MAIN problem for hospitalization   Large left ACA ischemic stroke  Please see below for itemized issues addressed in HOpsital- refer to other progress notes for clarity if needed  Discharge Condition: Fair  Diet recommendation: Dysphagia 1 pured diet  Filed Weights   07/15/21 1100  Weight: 49 kg    History of present illness:  88 year old white female known Parkinson's for several years HTN HLD prior CVA CKD 3 AA probable TIA several years ago Resident of [facility] since COVID 11/2020 Admitted to [facility] 6/5 secondary to right-sided weakness--code stroke was called blood pressures 140s to 180s systolic CT head showed nonhemorrhagic infarct L ACA territory and was not a thrombolytic or thrombectomy candidate Found to have creatinine of 2.1  Hospital Course:  Acute left ACA ischemic CVA MRI confirmed the CT findings A1c 5 lipid showed LDL 78 echo showed EF 60-65% Stroke team saw patient felt poor prognosis with large stroke Palliative was consulted Family wishes to trial short-term rehab care at facility where she was at and if fails to thrive hospice likely will be called in permissive hypertension was entertained Patient was seen by PT OT Patient will continue on Brilinta and aspirin for 4 weeks total and then aspirin alone Acute kidney injury Creatinine was 2.1 on admit but resolved to about 1.6 on discharge Several meds were  adjusted during hospital stay--we discontinued in particular Cozaar and amlodipine May need to resume amlodipine going forward Dysphagia secondary to stroke Patient will be placed on dysphagia 1 diet Anemia of hemodilution No reports of bleeding hemoglobin in the 9 range at baseline dropped to about 7 range Will need to continue aspirin and Brilinta as above Sinus bradycardia during hospitalization Likely constitutional--no further work-up   Discharge Exam: Vitals:   07/17/21 0348 07/17/21 0843  BP: (!) 146/51 (!) 143/50  Pulse: 78 64  Resp: 17 16  Temp: 99.8 F (37.7 C) 98.5 F (36.9 C)  SpO2: 94% 96%    Subj on day of d/c   She is quite sleepy and lethargic-she awakens but does not meaningfully respond Family member in room says that she had a long night She is able to interact some  General Exam on discharge  EOMI NCAT no focal deficit no wheeze no rales no rhonchi I am not able to get verbal response out of her Chest is clear no added sound no rales rhonchi S1-S2 no murmur no rub no gallop ROM intact Power could not be fully tested she is interactive moving limbs some  Discharge Instructions   Discharge Instructions     Diet - low sodium heart healthy   Complete by: As directed    Increase activity slowly   Complete by: As directed       Allergies as of 07/17/2021       Reactions   Atorvastatin Other (See Comments)   Muscle pain        Medication List     STOP taking these medications  amLODipine 2.5 MG tablet Commonly known as: NORVASC   calcium carbonate 600 MG Tabs tablet Commonly known as: OS-CAL   clopidogrel 75 MG tablet Commonly known as: PLAVIX   docusate sodium 100 MG capsule Commonly known as: COLACE   levofloxacin 750 MG tablet Commonly known as: LEVAQUIN   losartan-hydrochlorothiazide 100-25 MG tablet Commonly known as: HYZAAR   magnesium hydroxide 400 MG/5ML suspension Commonly known as: MILK OF MAGNESIA   magnesium  oxide 400 MG tablet Commonly known as: MAG-OX       TAKE these medications    acetaminophen 500 MG tablet Commonly known as: TYLENOL Take 1,000 mg by mouth every 8 (eight) hours as needed (pain).   aspirin 81 MG chewable tablet Chew 81 mg by mouth every morning.   cholecalciferol 25 MCG (1000 UNIT) tablet Commonly known as: VITAMIN D3 Take 1,000 Units by mouth every morning.   ferrous sulfate 324 MG Tbec Take 324 mg by mouth every morning.   mirtazapine 7.5 MG tablet Commonly known as: REMERON Take 1 tablet (7.5 mg total) by mouth at bedtime.   Nutritional Supplement Liqd Take 1 each by mouth See admin instructions. Give one magic cup by mouth twice daily - with lunch and supper   pantoprazole 40 MG tablet Commonly known as: PROTONIX Take 40 mg by mouth every morning.   polyethylene glycol 17 g packet Commonly known as: MIRALAX / GLYCOLAX Take 17 g by mouth daily as needed (constipation).   PreserVision AREDS Tabs Take 1 tablet by mouth 2 (two) times daily.   rosuvastatin 5 MG tablet Commonly known as: CRESTOR Take 5 mg by mouth every morning.   senna 8.6 MG Tabs tablet Commonly known as: SENOKOT Take 1 tablet by mouth 2 (two) times daily.   ticagrelor 90 MG Tabs tablet Commonly known as: BRILINTA Take 1 tablet (90 mg total) by mouth 2 (two) times daily.   ZINC OXIDE (TOPICAL) 10 % Crea Apply 1 application. topically every 8 (eight) hours as needed (irritation/redness on buttocks).       Allergies  Allergen Reactions   Atorvastatin Other (See Comments)    Muscle pain      The results of significant diagnostics from this hospitalization (including imaging, microbiology, ancillary and laboratory) are listed below for reference.    Significant Diagnostic Studies: MR ANGIO HEAD WO CONTRAST  Result Date: 07/15/2021 CLINICAL DATA:  Stroke, follow-up. Last seen normal 5 hours before admission. Mental status changes. Abnormal head CT. EXAM: MRI HEAD  WITHOUT CONTRAST MRA HEAD WITHOUT CONTRAST MRA NECK WITHOUT CONTRAST TECHNIQUE: Multiplanar, multiecho pulse sequences of the brain and surrounding structures were obtained without intravenous contrast. Angiographic images of the Circle of Willis were obtained using MRA technique without intravenous contrast. Angiographic images of the neck were obtained using MRA technique without intravenous contrast. Carotid stenosis measurements (when applicable) are obtained utilizing NASCET criteria, using the distal internal carotid diameter as the denominator. COMPARISON:  Head CT earlier same day.  MRI 04/19/2009 FINDINGS: MRI HEAD FINDINGS Brain: Diffusion imaging shows acute infarction of the left frontal lobe and left genu of the corpus callosum consistent with anterior cerebral artery distribution infarction. Mild swelling but no visible hemorrhage. Elsewhere, brainstem and cerebellum are normal. Cerebral hemispheres are otherwise normal without background small-vessel disease, extraordinary at this age. No hydrocephalus. No extra-axial collection. Vascular: Abnormal flow appearance of the left internal carotid artery. Skull and upper cervical spine: Negative Sinuses/Orbits: Normal Other: Right mastoid effusion. MRA HEAD FINDINGS Right internal carotid artery is  patent through the skull base and siphon regions. Right anterior and middle cerebral vessels are patent. No flow seen in the left anterior cerebral artery. No antegrade flow of the ICA through the skull base. Large patent posterior communicating artery gives supply to the left MCA territory. Non dominant right vertebral artery serves PICA and gives a tiny contribution to the basilar. Dominant left vertebral artery widely patent to the basilar. No basilar stenosis. Posterior circulation branch vessels are patent. Patent left PCA as noted. MRA NECK FINDINGS Right common carotid artery widely patent to the bifurcation. 20% stenosis of the internal carotid artery at  the bifurcation level but wide patency beyond that. There is stenosis of the ECA. Left common carotid artery is occluded at the origin. There is a patent left external carotid artery. Both vertebral arteries are patent through the cervical region showing antegrade flow. The left is dominant. IMPRESSION: Acute infarction in the left anterior cerebral artery territory. Mild swelling but no hemorrhage. Otherwise normal appearance of the brain. 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 Left external carotid artery continues to show flow. No flow in the ICA at the skull base. Blood flow to the left hemisphere occurs through a patent posterior communicating artery, supplying the left MCA territory. No flow seen in the left anterior cerebral artery. Electronically Signed   By: Paulina Fusi M.D.   On: 07/15/2021 18:28   MR ANGIO NECK WO CONTRAST  Result Date: 07/15/2021 CLINICAL DATA:  Stroke, follow-up. Last seen normal 5 hours before admission. Mental status changes. Abnormal head CT. EXAM: MRI HEAD WITHOUT CONTRAST MRA HEAD WITHOUT CONTRAST MRA NECK WITHOUT CONTRAST TECHNIQUE: Multiplanar, multiecho pulse sequences of the brain and surrounding structures were obtained without intravenous contrast. Angiographic images of the Circle of Willis were obtained using MRA technique without intravenous contrast. Angiographic images of the neck were obtained using MRA technique without intravenous contrast. Carotid stenosis measurements (when applicable) are obtained utilizing NASCET criteria, using the distal internal carotid diameter as the denominator. COMPARISON:  Head CT earlier same day.  MRI 04/19/2009 FINDINGS: MRI HEAD FINDINGS Brain: Diffusion imaging shows acute infarction of the left frontal lobe and left genu of the corpus callosum consistent with anterior cerebral artery distribution infarction. Mild swelling but no visible hemorrhage. Elsewhere, brainstem and cerebellum are normal. Cerebral  hemispheres are otherwise normal without background small-vessel disease, extraordinary at this age. No hydrocephalus. No extra-axial collection. Vascular: Abnormal flow appearance of the left internal carotid artery. Skull and upper cervical spine: Negative Sinuses/Orbits: Normal Other: Right mastoid effusion. MRA HEAD FINDINGS Right internal carotid artery is patent through the skull base and siphon regions. Right anterior and middle cerebral vessels are patent. No flow seen in the left anterior cerebral artery. No antegrade flow of the ICA through the skull base. Large patent posterior communicating artery gives supply to the left MCA territory. Non dominant right vertebral artery serves PICA and gives a tiny contribution to the basilar. Dominant left vertebral artery widely patent to the basilar. No basilar stenosis. Posterior circulation branch vessels are patent. Patent left PCA as noted. MRA NECK FINDINGS Right common carotid artery widely patent to the bifurcation. 20% stenosis of the internal carotid artery at the bifurcation level but wide patency beyond that. There is stenosis of the ECA. Left common carotid artery is occluded at the origin. There is a patent left external carotid artery. Both vertebral arteries are patent through the cervical region showing antegrade flow. The left is dominant. IMPRESSION: Acute infarction  in the left anterior cerebral artery territory. Mild swelling but no hemorrhage. Otherwise normal appearance of the brain. 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 Left external carotid artery continues to show flow. No flow in the ICA at the skull base. Blood flow to the left hemisphere occurs through a patent posterior communicating artery, supplying the left MCA territory. No flow seen in the left anterior cerebral artery. Electronically Signed   By: Paulina FusiMark  Shogry M.D.   On: 07/15/2021 18:25   MR BRAIN WO CONTRAST  Result Date: 07/15/2021 CLINICAL DATA:  Stroke,  follow-up. Last seen normal 5 hours before admission. Mental status changes. Abnormal head CT. EXAM: MRI HEAD WITHOUT CONTRAST MRA HEAD WITHOUT CONTRAST MRA NECK WITHOUT CONTRAST TECHNIQUE: Multiplanar, multiecho pulse sequences of the brain and surrounding structures were obtained without intravenous contrast. Angiographic images of the Circle of Willis were obtained using MRA technique without intravenous contrast. Angiographic images of the neck were obtained using MRA technique without intravenous contrast. Carotid stenosis measurements (when applicable) are obtained utilizing NASCET criteria, using the distal internal carotid diameter as the denominator. COMPARISON:  Head CT earlier same day.  MRI 04/19/2009 FINDINGS: MRI HEAD FINDINGS Brain: Diffusion imaging shows acute infarction of the left frontal lobe and left genu of the corpus callosum consistent with anterior cerebral artery distribution infarction. Mild swelling but no visible hemorrhage. Elsewhere, brainstem and cerebellum are normal. Cerebral hemispheres are otherwise normal without background small-vessel disease, extraordinary at this age. No hydrocephalus. No extra-axial collection. Vascular: Abnormal flow appearance of the left internal carotid artery. Skull and upper cervical spine: Negative Sinuses/Orbits: Normal Other: Right mastoid effusion. MRA HEAD FINDINGS Right internal carotid artery is patent through the skull base and siphon regions. Right anterior and middle cerebral vessels are patent. No flow seen in the left anterior cerebral artery. No antegrade flow of the ICA through the skull base. Large patent posterior communicating artery gives supply to the left MCA territory. Non dominant right vertebral artery serves PICA and gives a tiny contribution to the basilar. Dominant left vertebral artery widely patent to the basilar. No basilar stenosis. Posterior circulation branch vessels are patent. Patent left PCA as noted. MRA NECK  FINDINGS Right common carotid artery widely patent to the bifurcation. 20% stenosis of the internal carotid artery at the bifurcation level but wide patency beyond that. There is stenosis of the ECA. Left common carotid artery is occluded at the origin. There is a patent left external carotid artery. Both vertebral arteries are patent through the cervical region showing antegrade flow. The left is dominant. IMPRESSION: Acute infarction in the left anterior cerebral artery territory. Mild swelling but no hemorrhage. Otherwise normal appearance of the brain. 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 Left external carotid artery continues to show flow. No flow in the ICA at the skull base. Blood flow to the left hemisphere occurs through a patent posterior communicating artery, supplying the left MCA territory. No flow seen in the left anterior cerebral artery. Electronically Signed   By: Paulina FusiMark  Shogry M.D.   On: 07/15/2021 17:57   DG ABD ACUTE 2+V W 1V CHEST  Result Date: 07/15/2021 CLINICAL DATA:  Constipation. EXAM: DG ABDOMEN ACUTE WITH 1 VIEW CHEST COMPARISON:  May 31, 2021 FINDINGS: There is no evidence of dilated bowel loops or free intraperitoneal air. A moderate stool burden is noted. No radiopaque calculi are identified. Numerous subcentimeter gallstones are seen overlying the right upper quadrant. Heart size and mediastinal contours are within normal  limits. Mild to moderate severity atelectasis and/or infiltrate is seen within the left lung base. There is a small left pleural effusion. IMPRESSION: 1. Moderate stool burden without evidence of bowel obstruction. 2. Mild to moderate severity left basilar atelectasis and/or infiltrate. 3. Small left pleural effusion. 4. Cholelithiasis. Electronically Signed   By: Aram Candela M.D.   On: 07/15/2021 20:08   ECHOCARDIOGRAM COMPLETE  Result Date: 07/15/2021    ECHOCARDIOGRAM REPORT   Patient Name:   Maria Hanna Date of Exam:  07/15/2021 Medical Rec #:  [id number]             Height:       61.0 in Accession #:    [id number]            Weight:       108.0 lb Date of Birth:  December 03, 1925             BSA:          1.454 m Patient Age:    89 years              BP:           155/42 mmHg Patient Gender: F                     HR:           53 bpm. Exam Location:  Inpatient Procedure: 2D Echo, Cardiac Doppler and Color Doppler Indications:    Stroke  History:        Patient has prior history of Echocardiogram examinations, most                 recent 05/02/2013. Risk Factors:Hypertension and HLD.  Sonographer:    Rodrigo Ran RCS Referring Phys: [phone] RONDELL A SMITH IMPRESSIONS  1. Left ventricular ejection fraction, by estimation, is 60 to 65%. The left ventricle has normal function. The left ventricle has no regional wall motion abnormalities. Left ventricular diastolic parameters are indeterminate.  2. Right ventricular systolic function is normal. The right ventricular size is normal.  3. The mitral valve is normal in structure. Mild mitral valve regurgitation. No evidence of mitral stenosis.  4. The aortic valve is calcified. Aortic valve regurgitation is mild. Severe aortic valve stenosis. Aortic regurgitation PHT measures 596 msec. Aortic valve area, by VTI measures 0.99 cm. Aortic valve mean gradient measures 45.4 mmHg. Aortic valve Vmax  measures 4.44 m/s.  5. The inferior vena cava is normal in size with greater than 50% respiratory variability, suggesting right atrial pressure of 3 mmHg. FINDINGS  Left Ventricle: Left ventricular ejection fraction, by estimation, is 60 to 65%. The left ventricle has normal function. The left ventricle has no regional wall motion abnormalities. The left ventricular internal cavity size was normal in size. There is  no left ventricular hypertrophy. Left ventricular diastolic parameters are indeterminate. Right Ventricle: The right ventricular size is normal. No increase in right ventricular wall  thickness. Right ventricular systolic function is normal. Left Atrium: Left atrial size was normal in size. Right Atrium: Right atrial size was normal in size. Pericardium: There is no evidence of pericardial effusion. Presence of epicardial fat layer. Mitral Valve: The mitral valve is normal in structure. Mild to moderate mitral annular calcification. Mild mitral valve regurgitation. No evidence of mitral valve stenosis. Tricuspid Valve: The tricuspid valve is normal in structure. Tricuspid valve regurgitation is not demonstrated. No evidence of tricuspid stenosis. Aortic Valve: The aortic valve is calcified.  Aortic valve regurgitation is mild. Aortic regurgitation PHT measures 596 msec. Severe aortic stenosis is present. Aortic valve mean gradient measures 45.4 mmHg. Aortic valve peak gradient measures 78.8 mmHg. Aortic valve area, by VTI measures 0.99 cm. Pulmonic Valve: The pulmonic valve was normal in structure. Pulmonic valve regurgitation is trivial. No evidence of pulmonic stenosis. Aorta: The aortic root is normal in size and structure. Venous: The inferior vena cava is normal in size with greater than 50% respiratory variability, suggesting right atrial pressure of 3 mmHg. IAS/Shunts: No atrial level shunt detected by color flow Doppler.  LEFT VENTRICLE PLAX 2D LVIDd:         5.20 cm   Diastology LVIDs:         2.90 cm   LV e' medial:    3.92 cm/s LV PW:         1.00 cm   LV E/e' medial:  20.4 LV IVS:        1.00 cm   LV e' lateral:   3.59 cm/s LVOT diam:     2.00 cm   LV E/e' lateral: 22.3 LV SV:         102 LV SV Index:   70 LVOT Area:     3.14 cm  RIGHT VENTRICLE             IVC RV Basal diam:  2.60 cm     IVC diam: 1.30 cm RV S prime:     18.90 cm/s TAPSE (M-mode): 1.6 cm LEFT ATRIUM             Index        RIGHT ATRIUM          Index LA diam:        3.90 cm 2.68 cm/m   RA Area:     9.16 cm LA Vol (A2C):   26.7 ml 18.37 ml/m  RA Volume:   16.70 ml 11.49 ml/m LA Vol (A4C):   34.2 ml 23.53 ml/m  LA Biplane Vol: 30.5 ml 20.98 ml/m  AORTIC VALVE                     PULMONIC VALVE AV Area (Vmax):    0.93 cm      PV Vmax:       1.42 m/s AV Area (VTI):     0.99 cm      PV Peak grad:  8.1 mmHg AV Vmax:           443.80 cm/s AV Vmean:          316.000 cm/s AV VTI:            1.032 m AV Peak Grad:      78.8 mmHg AV Mean Grad:      45.4 mmHg LVOT Vmax:         131.00 cm/s LVOT VTI:          0.326 m LVOT/AV VTI ratio: 0.32 AI PHT:            596 msec  AORTA Ao Root diam: 3.00 cm MITRAL VALVE                TRICUSPID VALVE MV Area (PHT): 2.38 cm     TR Peak grad:   10.6 mmHg MV Decel Time: 319 msec     TR Vmax:        163.00 cm/s MR Peak grad: 179.0 mmHg MR Vmax:  669.00 cm/s   SHUNTS MV E velocity: 79.90 cm/s   Systemic VTI:  0.33 m MV A velocity: 134.00 cm/s  Systemic Diam: 2.00 cm MV E/A ratio:  0.60 Kardie Tobb DO Electronically signed by Thomasene Ripple DO Signature Date/Time: 07/15/2021/5:15:17 PM    Final    CT HEAD CODE STROKE WO CONTRAST  Result Date: 07/15/2021 CLINICAL DATA:  Code stroke. Neuro deficit, acute, stroke suspected. Right-sided weakness and facial droop. Last known well 6 o'clock a.m. today. EXAM: CT HEAD WITHOUT CONTRAST TECHNIQUE: Contiguous axial images were obtained from the base of the skull through the vertex without intravenous contrast. RADIATION DOSE REDUCTION: This exam was performed according to the departmental dose-optimization program which includes automated exposure control, adjustment of the mA and/or kV according to patient size and/or use of iterative reconstruction technique. COMPARISON:  CT head without contrast 05/31/2021 at [facility]. FINDINGS: Brain: Acute nonhemorrhagic infarct is present the left ACA territory with loss of gray-white differentiation in some effacement of the sulci. MCA territory is unremarkable. Basal ganglia are intact. Insular ribbon is normal. No acute or focal right hemisphere lesions are present. The brainstem and cerebellum are within  normal limits. Vascular: Atherosclerotic calcifications are present within the cavernous internal carotid arteries bilaterally and the dural margin of the left vertebral artery. Proximal left A3 hyperdensity suspected. Correlate with CTA. Atherosclerotic calcifications are also present at the Skull: Calvarium is intact. No focal lytic or blastic lesions are present. No significant extracranial soft tissue lesion is present. Sinuses/Orbits: The paranasal sinuses and mastoid air cells are clear. Bilateral lens replacements are noted. Globes and orbits are otherwise unremarkable. ASPECTS [facility] Stroke Program Early CT Score) - Ganglionic level infarction (caudate, lentiform nuclei, internal capsule, insula, M1-M3 cortex): 7/7 - Supraganglionic infarction (M4-M6 cortex): 3/3 Total score (0-10 with 10 being normal): 10/10 IMPRESSION: 1. Acute nonhemorrhagic infarct involving the left ACA territory. 2. Question hyperdensity of the left pericallosal artery at its origin. 3. ASPECTS is 10/10. The above was relayed via text pager to Dr. Amada Jupiter on 07/15/2021 at 11:18 . Electronically Signed   By: Marin Roberts M.D.   On: 07/15/2021 11:18    Microbiology: No results found for this or any previous visit (from the past 240 hour(s)).   Labs: Basic Metabolic Panel: Recent Labs  Lab 07/15/21 1110 07/15/21 1235 07/16/21 0232 07/17/21 0514  NA 135 138 137 140  K 7.2* 3.9 3.8 4.1  CL 105 105 107 107  CO2  --  23 23 24   GLUCOSE 107* 128* 127* 114*  BUN 66* 46* 45* 49*  CREATININE 2.30* 2.18* 2.00* 1.68*  CALCIUM  --  9.2 8.7* 8.1*  MG  --   --   --  2.3   Liver Function Tests: Recent Labs  Lab 07/15/21 1235  AST 14*  ALT 11  ALKPHOS 88  BILITOT 0.6  PROT 7.0  ALBUMIN 2.4*   No results for input(s): LIPASE, AMYLASE in the last 168 hours. No results for input(s): AMMONIA in the last 168 hours. CBC: Recent Labs  Lab 07/15/21 1100 07/15/21 1110 07/16/21 0232 07/17/21 0514  WBC 8.6  --   11.7* 10.9*  NEUTROABS 6.5  --   --   --   HGB 9.3* 9.5* 7.7* 7.1*  HCT 30.9* 28.0* 26.2* 24.1*  MCV 96.3  --  94.6 94.1  PLT 402*  --  428* 355   Cardiac Enzymes: No results for input(s): CKTOTAL, CKMB, CKMBINDEX, TROPONINI in the last 168 hours. BNP:  BNP (last 3 results) No results for input(s): BNP in the last 8760 hours.  ProBNP (last 3 results) No results for input(s): PROBNP in the last 8760 hours.  CBG: Recent Labs  Lab 07/15/21 1103  GLUCAP 108*       Signed:  Rhetta Mura MD   [facility] 07/17/2021, 10:38 AM

## 2021-07-18 DIAGNOSIS — R41841 Cognitive communication deficit: Secondary | ICD-10-CM | POA: Diagnosis not present

## 2021-07-18 DIAGNOSIS — M6259 Muscle wasting and atrophy, not elsewhere classified, multiple sites: Secondary | ICD-10-CM | POA: Diagnosis not present

## 2021-07-18 DIAGNOSIS — E559 Vitamin D deficiency, unspecified: Secondary | ICD-10-CM | POA: Diagnosis not present

## 2021-07-18 DIAGNOSIS — M8580 Other specified disorders of bone density and structure, unspecified site: Secondary | ICD-10-CM | POA: Diagnosis not present

## 2021-07-18 DIAGNOSIS — G20 Parkinson's disease: Secondary | ICD-10-CM | POA: Diagnosis not present

## 2021-07-18 DIAGNOSIS — K219 Gastro-esophageal reflux disease without esophagitis: Secondary | ICD-10-CM | POA: Diagnosis not present

## 2021-07-18 DIAGNOSIS — M170 Bilateral primary osteoarthritis of knee: Secondary | ICD-10-CM | POA: Diagnosis not present

## 2021-07-18 DIAGNOSIS — I10 Essential (primary) hypertension: Secondary | ICD-10-CM | POA: Diagnosis not present

## 2021-07-18 DIAGNOSIS — I69891 Dysphagia following other cerebrovascular disease: Secondary | ICD-10-CM | POA: Diagnosis not present

## 2021-07-18 DIAGNOSIS — I69951 Hemiplegia and hemiparesis following unspecified cerebrovascular disease affecting right dominant side: Secondary | ICD-10-CM | POA: Diagnosis not present

## 2021-07-18 DIAGNOSIS — E538 Deficiency of other specified B group vitamins: Secondary | ICD-10-CM | POA: Diagnosis not present

## 2021-07-18 DIAGNOSIS — G8103 Flaccid hemiplegia affecting right nondominant side: Secondary | ICD-10-CM | POA: Diagnosis not present

## 2021-07-18 DIAGNOSIS — N183 Chronic kidney disease, stage 3 unspecified: Secondary | ICD-10-CM | POA: Diagnosis not present

## 2021-07-18 DIAGNOSIS — D649 Anemia, unspecified: Secondary | ICD-10-CM | POA: Diagnosis not present

## 2021-07-18 DIAGNOSIS — N179 Acute kidney failure, unspecified: Secondary | ICD-10-CM | POA: Diagnosis not present

## 2021-07-18 DIAGNOSIS — R627 Adult failure to thrive: Secondary | ICD-10-CM | POA: Diagnosis not present

## 2021-07-18 DIAGNOSIS — I69320 Aphasia following cerebral infarction: Secondary | ICD-10-CM | POA: Diagnosis not present

## 2021-07-18 DIAGNOSIS — I63322 Cerebral infarction due to thrombosis of left anterior cerebral artery: Secondary | ICD-10-CM | POA: Diagnosis not present

## 2021-07-18 DIAGNOSIS — Z7409 Other reduced mobility: Secondary | ICD-10-CM | POA: Diagnosis not present

## 2021-07-18 DIAGNOSIS — E785 Hyperlipidemia, unspecified: Secondary | ICD-10-CM | POA: Diagnosis not present

## 2021-07-18 DIAGNOSIS — R4701 Aphasia: Secondary | ICD-10-CM | POA: Diagnosis not present

## 2021-07-18 DIAGNOSIS — R1319 Other dysphagia: Secondary | ICD-10-CM | POA: Diagnosis not present

## 2021-07-18 DIAGNOSIS — N390 Urinary tract infection, site not specified: Secondary | ICD-10-CM | POA: Diagnosis not present

## 2021-07-18 DIAGNOSIS — Z736 Limitation of activities due to disability: Secondary | ICD-10-CM | POA: Diagnosis not present

## 2021-07-18 DIAGNOSIS — Z8616 Personal history of COVID-19: Secondary | ICD-10-CM | POA: Diagnosis not present

## 2021-07-19 DIAGNOSIS — I63322 Cerebral infarction due to thrombosis of left anterior cerebral artery: Secondary | ICD-10-CM | POA: Diagnosis not present

## 2021-07-19 DIAGNOSIS — G20 Parkinson's disease: Secondary | ICD-10-CM | POA: Diagnosis not present

## 2021-07-19 DIAGNOSIS — Z736 Limitation of activities due to disability: Secondary | ICD-10-CM | POA: Diagnosis not present

## 2021-07-19 DIAGNOSIS — I69891 Dysphagia following other cerebrovascular disease: Secondary | ICD-10-CM | POA: Diagnosis not present

## 2021-07-19 DIAGNOSIS — I69320 Aphasia following cerebral infarction: Secondary | ICD-10-CM | POA: Diagnosis not present

## 2021-07-19 DIAGNOSIS — G8103 Flaccid hemiplegia affecting right nondominant side: Secondary | ICD-10-CM | POA: Diagnosis not present

## 2021-07-20 DIAGNOSIS — I69320 Aphasia following cerebral infarction: Secondary | ICD-10-CM | POA: Diagnosis not present

## 2021-07-20 DIAGNOSIS — I63322 Cerebral infarction due to thrombosis of left anterior cerebral artery: Secondary | ICD-10-CM | POA: Diagnosis not present

## 2021-07-20 DIAGNOSIS — G20 Parkinson's disease: Secondary | ICD-10-CM | POA: Diagnosis not present

## 2021-07-20 DIAGNOSIS — Z736 Limitation of activities due to disability: Secondary | ICD-10-CM | POA: Diagnosis not present

## 2021-07-20 DIAGNOSIS — I69891 Dysphagia following other cerebrovascular disease: Secondary | ICD-10-CM | POA: Diagnosis not present

## 2021-07-20 DIAGNOSIS — G8103 Flaccid hemiplegia affecting right nondominant side: Secondary | ICD-10-CM | POA: Diagnosis not present

## 2021-07-22 DIAGNOSIS — G20 Parkinson's disease: Secondary | ICD-10-CM | POA: Diagnosis not present

## 2021-07-22 DIAGNOSIS — I69891 Dysphagia following other cerebrovascular disease: Secondary | ICD-10-CM | POA: Diagnosis not present

## 2021-07-22 DIAGNOSIS — Z736 Limitation of activities due to disability: Secondary | ICD-10-CM | POA: Diagnosis not present

## 2021-07-22 DIAGNOSIS — I69320 Aphasia following cerebral infarction: Secondary | ICD-10-CM | POA: Diagnosis not present

## 2021-07-22 DIAGNOSIS — I63322 Cerebral infarction due to thrombosis of left anterior cerebral artery: Secondary | ICD-10-CM | POA: Diagnosis not present

## 2021-07-22 DIAGNOSIS — G8103 Flaccid hemiplegia affecting right nondominant side: Secondary | ICD-10-CM | POA: Diagnosis not present

## 2021-07-23 DIAGNOSIS — N179 Acute kidney failure, unspecified: Secondary | ICD-10-CM | POA: Diagnosis not present

## 2021-07-23 DIAGNOSIS — D649 Anemia, unspecified: Secondary | ICD-10-CM | POA: Diagnosis not present

## 2021-07-23 DIAGNOSIS — G20 Parkinson's disease: Secondary | ICD-10-CM | POA: Diagnosis not present

## 2021-07-23 DIAGNOSIS — I69320 Aphasia following cerebral infarction: Secondary | ICD-10-CM | POA: Diagnosis not present

## 2021-07-23 DIAGNOSIS — I69951 Hemiplegia and hemiparesis following unspecified cerebrovascular disease affecting right dominant side: Secondary | ICD-10-CM | POA: Diagnosis not present

## 2021-07-23 DIAGNOSIS — Z736 Limitation of activities due to disability: Secondary | ICD-10-CM | POA: Diagnosis not present

## 2021-07-23 DIAGNOSIS — R140 Abdominal distension (gaseous): Secondary | ICD-10-CM | POA: Diagnosis not present

## 2021-07-23 DIAGNOSIS — G8103 Flaccid hemiplegia affecting right nondominant side: Secondary | ICD-10-CM | POA: Diagnosis not present

## 2021-07-23 DIAGNOSIS — I63322 Cerebral infarction due to thrombosis of left anterior cerebral artery: Secondary | ICD-10-CM | POA: Diagnosis not present

## 2021-07-23 DIAGNOSIS — I69891 Dysphagia following other cerebrovascular disease: Secondary | ICD-10-CM | POA: Diagnosis not present

## 2021-07-24 DIAGNOSIS — I69891 Dysphagia following other cerebrovascular disease: Secondary | ICD-10-CM | POA: Diagnosis not present

## 2021-07-24 DIAGNOSIS — G8103 Flaccid hemiplegia affecting right nondominant side: Secondary | ICD-10-CM | POA: Diagnosis not present

## 2021-07-24 DIAGNOSIS — Z736 Limitation of activities due to disability: Secondary | ICD-10-CM | POA: Diagnosis not present

## 2021-07-24 DIAGNOSIS — G20 Parkinson's disease: Secondary | ICD-10-CM | POA: Diagnosis not present

## 2021-07-24 DIAGNOSIS — I63322 Cerebral infarction due to thrombosis of left anterior cerebral artery: Secondary | ICD-10-CM | POA: Diagnosis not present

## 2021-07-24 DIAGNOSIS — I69320 Aphasia following cerebral infarction: Secondary | ICD-10-CM | POA: Diagnosis not present

## 2021-07-25 DIAGNOSIS — I69891 Dysphagia following other cerebrovascular disease: Secondary | ICD-10-CM | POA: Diagnosis not present

## 2021-07-25 DIAGNOSIS — R140 Abdominal distension (gaseous): Secondary | ICD-10-CM | POA: Diagnosis not present

## 2021-07-25 DIAGNOSIS — G8103 Flaccid hemiplegia affecting right nondominant side: Secondary | ICD-10-CM | POA: Diagnosis not present

## 2021-07-25 DIAGNOSIS — Z736 Limitation of activities due to disability: Secondary | ICD-10-CM | POA: Diagnosis not present

## 2021-07-25 DIAGNOSIS — I69320 Aphasia following cerebral infarction: Secondary | ICD-10-CM | POA: Diagnosis not present

## 2021-07-25 DIAGNOSIS — G20 Parkinson's disease: Secondary | ICD-10-CM | POA: Diagnosis not present

## 2021-07-25 DIAGNOSIS — I63322 Cerebral infarction due to thrombosis of left anterior cerebral artery: Secondary | ICD-10-CM | POA: Diagnosis not present

## 2021-07-26 DIAGNOSIS — I63322 Cerebral infarction due to thrombosis of left anterior cerebral artery: Secondary | ICD-10-CM | POA: Diagnosis not present

## 2021-07-26 DIAGNOSIS — Z736 Limitation of activities due to disability: Secondary | ICD-10-CM | POA: Diagnosis not present

## 2021-07-26 DIAGNOSIS — G20 Parkinson's disease: Secondary | ICD-10-CM | POA: Diagnosis not present

## 2021-07-26 DIAGNOSIS — I69320 Aphasia following cerebral infarction: Secondary | ICD-10-CM | POA: Diagnosis not present

## 2021-07-26 DIAGNOSIS — G8103 Flaccid hemiplegia affecting right nondominant side: Secondary | ICD-10-CM | POA: Diagnosis not present

## 2021-07-26 DIAGNOSIS — I69891 Dysphagia following other cerebrovascular disease: Secondary | ICD-10-CM | POA: Diagnosis not present

## 2021-07-29 DIAGNOSIS — I69320 Aphasia following cerebral infarction: Secondary | ICD-10-CM | POA: Diagnosis not present

## 2021-07-29 DIAGNOSIS — I69891 Dysphagia following other cerebrovascular disease: Secondary | ICD-10-CM | POA: Diagnosis not present

## 2021-07-29 DIAGNOSIS — I63322 Cerebral infarction due to thrombosis of left anterior cerebral artery: Secondary | ICD-10-CM | POA: Diagnosis not present

## 2021-07-29 DIAGNOSIS — Z736 Limitation of activities due to disability: Secondary | ICD-10-CM | POA: Diagnosis not present

## 2021-07-29 DIAGNOSIS — G20 Parkinson's disease: Secondary | ICD-10-CM | POA: Diagnosis not present

## 2021-07-29 DIAGNOSIS — G8103 Flaccid hemiplegia affecting right nondominant side: Secondary | ICD-10-CM | POA: Diagnosis not present

## 2021-07-30 DIAGNOSIS — G20 Parkinson's disease: Secondary | ICD-10-CM | POA: Diagnosis not present

## 2021-07-30 DIAGNOSIS — I63322 Cerebral infarction due to thrombosis of left anterior cerebral artery: Secondary | ICD-10-CM | POA: Diagnosis not present

## 2021-07-30 DIAGNOSIS — Z736 Limitation of activities due to disability: Secondary | ICD-10-CM | POA: Diagnosis not present

## 2021-07-30 DIAGNOSIS — I69891 Dysphagia following other cerebrovascular disease: Secondary | ICD-10-CM | POA: Diagnosis not present

## 2021-07-30 DIAGNOSIS — I69320 Aphasia following cerebral infarction: Secondary | ICD-10-CM | POA: Diagnosis not present

## 2021-07-30 DIAGNOSIS — G8103 Flaccid hemiplegia affecting right nondominant side: Secondary | ICD-10-CM | POA: Diagnosis not present

## 2021-07-31 DIAGNOSIS — K567 Ileus, unspecified: Secondary | ICD-10-CM | POA: Diagnosis not present

## 2021-07-31 DIAGNOSIS — G20 Parkinson's disease: Secondary | ICD-10-CM | POA: Diagnosis not present

## 2021-07-31 DIAGNOSIS — I69891 Dysphagia following other cerebrovascular disease: Secondary | ICD-10-CM | POA: Diagnosis not present

## 2021-07-31 DIAGNOSIS — I63322 Cerebral infarction due to thrombosis of left anterior cerebral artery: Secondary | ICD-10-CM | POA: Diagnosis not present

## 2021-07-31 DIAGNOSIS — G8103 Flaccid hemiplegia affecting right nondominant side: Secondary | ICD-10-CM | POA: Diagnosis not present

## 2021-07-31 DIAGNOSIS — R239 Unspecified skin changes: Secondary | ICD-10-CM | POA: Diagnosis not present

## 2021-07-31 DIAGNOSIS — Z736 Limitation of activities due to disability: Secondary | ICD-10-CM | POA: Diagnosis not present

## 2021-07-31 DIAGNOSIS — I69320 Aphasia following cerebral infarction: Secondary | ICD-10-CM | POA: Diagnosis not present

## 2021-08-01 ENCOUNTER — Encounter: Payer: Self-pay | Admitting: Cardiology

## 2021-08-01 ENCOUNTER — Encounter: Payer: Self-pay | Admitting: *Deleted

## 2021-08-01 DIAGNOSIS — G20 Parkinson's disease: Secondary | ICD-10-CM | POA: Diagnosis not present

## 2021-08-01 DIAGNOSIS — G8103 Flaccid hemiplegia affecting right nondominant side: Secondary | ICD-10-CM | POA: Diagnosis not present

## 2021-08-01 DIAGNOSIS — Z736 Limitation of activities due to disability: Secondary | ICD-10-CM | POA: Diagnosis not present

## 2021-08-01 DIAGNOSIS — I63322 Cerebral infarction due to thrombosis of left anterior cerebral artery: Secondary | ICD-10-CM | POA: Diagnosis not present

## 2021-08-01 DIAGNOSIS — I69891 Dysphagia following other cerebrovascular disease: Secondary | ICD-10-CM | POA: Diagnosis not present

## 2021-08-01 DIAGNOSIS — I69320 Aphasia following cerebral infarction: Secondary | ICD-10-CM | POA: Diagnosis not present

## 2021-08-02 DIAGNOSIS — Z736 Limitation of activities due to disability: Secondary | ICD-10-CM | POA: Diagnosis not present

## 2021-08-02 DIAGNOSIS — I69320 Aphasia following cerebral infarction: Secondary | ICD-10-CM | POA: Diagnosis not present

## 2021-08-02 DIAGNOSIS — I69891 Dysphagia following other cerebrovascular disease: Secondary | ICD-10-CM | POA: Diagnosis not present

## 2021-08-02 DIAGNOSIS — G20 Parkinson's disease: Secondary | ICD-10-CM | POA: Diagnosis not present

## 2021-08-02 DIAGNOSIS — I63322 Cerebral infarction due to thrombosis of left anterior cerebral artery: Secondary | ICD-10-CM | POA: Diagnosis not present

## 2021-08-02 DIAGNOSIS — G8103 Flaccid hemiplegia affecting right nondominant side: Secondary | ICD-10-CM | POA: Diagnosis not present

## 2021-08-03 DIAGNOSIS — G8103 Flaccid hemiplegia affecting right nondominant side: Secondary | ICD-10-CM | POA: Diagnosis not present

## 2021-08-03 DIAGNOSIS — G20 Parkinson's disease: Secondary | ICD-10-CM | POA: Diagnosis not present

## 2021-08-03 DIAGNOSIS — Z736 Limitation of activities due to disability: Secondary | ICD-10-CM | POA: Diagnosis not present

## 2021-08-03 DIAGNOSIS — I63322 Cerebral infarction due to thrombosis of left anterior cerebral artery: Secondary | ICD-10-CM | POA: Diagnosis not present

## 2021-08-03 DIAGNOSIS — I69320 Aphasia following cerebral infarction: Secondary | ICD-10-CM | POA: Diagnosis not present

## 2021-08-03 DIAGNOSIS — I69891 Dysphagia following other cerebrovascular disease: Secondary | ICD-10-CM | POA: Diagnosis not present

## 2021-08-05 ENCOUNTER — Telehealth: Payer: Self-pay | Admitting: Oncology

## 2021-08-05 DIAGNOSIS — G8103 Flaccid hemiplegia affecting right nondominant side: Secondary | ICD-10-CM | POA: Diagnosis not present

## 2021-08-05 DIAGNOSIS — G20 Parkinson's disease: Secondary | ICD-10-CM | POA: Diagnosis not present

## 2021-08-05 DIAGNOSIS — I63322 Cerebral infarction due to thrombosis of left anterior cerebral artery: Secondary | ICD-10-CM | POA: Diagnosis not present

## 2021-08-05 DIAGNOSIS — I69320 Aphasia following cerebral infarction: Secondary | ICD-10-CM | POA: Diagnosis not present

## 2021-08-05 DIAGNOSIS — Z736 Limitation of activities due to disability: Secondary | ICD-10-CM | POA: Diagnosis not present

## 2021-08-05 DIAGNOSIS — I69891 Dysphagia following other cerebrovascular disease: Secondary | ICD-10-CM | POA: Diagnosis not present

## 2021-08-05 NOTE — Telephone Encounter (Signed)
08/05/21 all appts refused per family request

## 2021-08-06 DIAGNOSIS — I69320 Aphasia following cerebral infarction: Secondary | ICD-10-CM | POA: Diagnosis not present

## 2021-08-06 DIAGNOSIS — Z736 Limitation of activities due to disability: Secondary | ICD-10-CM | POA: Diagnosis not present

## 2021-08-06 DIAGNOSIS — I69891 Dysphagia following other cerebrovascular disease: Secondary | ICD-10-CM | POA: Diagnosis not present

## 2021-08-06 DIAGNOSIS — G8103 Flaccid hemiplegia affecting right nondominant side: Secondary | ICD-10-CM | POA: Diagnosis not present

## 2021-08-06 DIAGNOSIS — I63322 Cerebral infarction due to thrombosis of left anterior cerebral artery: Secondary | ICD-10-CM | POA: Diagnosis not present

## 2021-08-06 DIAGNOSIS — G20 Parkinson's disease: Secondary | ICD-10-CM | POA: Diagnosis not present

## 2021-08-07 DIAGNOSIS — G8103 Flaccid hemiplegia affecting right nondominant side: Secondary | ICD-10-CM | POA: Diagnosis not present

## 2021-08-07 DIAGNOSIS — I63322 Cerebral infarction due to thrombosis of left anterior cerebral artery: Secondary | ICD-10-CM | POA: Diagnosis not present

## 2021-08-07 DIAGNOSIS — G20 Parkinson's disease: Secondary | ICD-10-CM | POA: Diagnosis not present

## 2021-08-07 DIAGNOSIS — Z736 Limitation of activities due to disability: Secondary | ICD-10-CM | POA: Diagnosis not present

## 2021-08-07 DIAGNOSIS — I69320 Aphasia following cerebral infarction: Secondary | ICD-10-CM | POA: Diagnosis not present

## 2021-08-07 DIAGNOSIS — I69891 Dysphagia following other cerebrovascular disease: Secondary | ICD-10-CM | POA: Diagnosis not present

## 2021-08-08 ENCOUNTER — Ambulatory Visit: Payer: Medicare Other | Admitting: Oncology

## 2021-08-08 ENCOUNTER — Other Ambulatory Visit: Payer: Medicare Other

## 2021-08-08 DIAGNOSIS — I69891 Dysphagia following other cerebrovascular disease: Secondary | ICD-10-CM | POA: Diagnosis not present

## 2021-08-08 DIAGNOSIS — I69320 Aphasia following cerebral infarction: Secondary | ICD-10-CM | POA: Diagnosis not present

## 2021-08-08 DIAGNOSIS — I63322 Cerebral infarction due to thrombosis of left anterior cerebral artery: Secondary | ICD-10-CM | POA: Diagnosis not present

## 2021-08-08 DIAGNOSIS — Z736 Limitation of activities due to disability: Secondary | ICD-10-CM | POA: Diagnosis not present

## 2021-08-08 DIAGNOSIS — G20 Parkinson's disease: Secondary | ICD-10-CM | POA: Diagnosis not present

## 2021-08-08 DIAGNOSIS — G8103 Flaccid hemiplegia affecting right nondominant side: Secondary | ICD-10-CM | POA: Diagnosis not present

## 2021-08-09 ENCOUNTER — Ambulatory Visit: Payer: Medicare Other | Admitting: Cardiology

## 2021-08-09 DIAGNOSIS — Z736 Limitation of activities due to disability: Secondary | ICD-10-CM | POA: Diagnosis not present

## 2021-08-09 DIAGNOSIS — I69891 Dysphagia following other cerebrovascular disease: Secondary | ICD-10-CM | POA: Diagnosis not present

## 2021-08-09 DIAGNOSIS — G20 Parkinson's disease: Secondary | ICD-10-CM | POA: Diagnosis not present

## 2021-08-09 DIAGNOSIS — I63322 Cerebral infarction due to thrombosis of left anterior cerebral artery: Secondary | ICD-10-CM | POA: Diagnosis not present

## 2021-08-09 DIAGNOSIS — G8103 Flaccid hemiplegia affecting right nondominant side: Secondary | ICD-10-CM | POA: Diagnosis not present

## 2021-08-09 DIAGNOSIS — I69320 Aphasia following cerebral infarction: Secondary | ICD-10-CM | POA: Diagnosis not present

## 2021-08-12 DIAGNOSIS — E559 Vitamin D deficiency, unspecified: Secondary | ICD-10-CM | POA: Diagnosis not present

## 2021-08-12 DIAGNOSIS — I69320 Aphasia following cerebral infarction: Secondary | ICD-10-CM | POA: Diagnosis not present

## 2021-08-12 DIAGNOSIS — I10 Essential (primary) hypertension: Secondary | ICD-10-CM | POA: Diagnosis not present

## 2021-08-12 DIAGNOSIS — E785 Hyperlipidemia, unspecified: Secondary | ICD-10-CM | POA: Diagnosis not present

## 2021-08-12 DIAGNOSIS — N390 Urinary tract infection, site not specified: Secondary | ICD-10-CM | POA: Diagnosis not present

## 2021-08-12 DIAGNOSIS — M8580 Other specified disorders of bone density and structure, unspecified site: Secondary | ICD-10-CM | POA: Diagnosis not present

## 2021-08-12 DIAGNOSIS — Z7409 Other reduced mobility: Secondary | ICD-10-CM | POA: Diagnosis not present

## 2021-08-12 DIAGNOSIS — R627 Adult failure to thrive: Secondary | ICD-10-CM | POA: Diagnosis not present

## 2021-08-12 DIAGNOSIS — G20 Parkinson's disease: Secondary | ICD-10-CM | POA: Diagnosis not present

## 2021-08-12 DIAGNOSIS — G8103 Flaccid hemiplegia affecting right nondominant side: Secondary | ICD-10-CM | POA: Diagnosis not present

## 2021-08-12 DIAGNOSIS — R1319 Other dysphagia: Secondary | ICD-10-CM | POA: Diagnosis not present

## 2021-08-12 DIAGNOSIS — Z736 Limitation of activities due to disability: Secondary | ICD-10-CM | POA: Diagnosis not present

## 2021-08-12 DIAGNOSIS — R4701 Aphasia: Secondary | ICD-10-CM | POA: Diagnosis not present

## 2021-08-12 DIAGNOSIS — E538 Deficiency of other specified B group vitamins: Secondary | ICD-10-CM | POA: Diagnosis not present

## 2021-08-12 DIAGNOSIS — I63322 Cerebral infarction due to thrombosis of left anterior cerebral artery: Secondary | ICD-10-CM | POA: Diagnosis not present

## 2021-08-12 DIAGNOSIS — R41841 Cognitive communication deficit: Secondary | ICD-10-CM | POA: Diagnosis not present

## 2021-08-12 DIAGNOSIS — N183 Chronic kidney disease, stage 3 unspecified: Secondary | ICD-10-CM | POA: Diagnosis not present

## 2021-08-12 DIAGNOSIS — M6259 Muscle wasting and atrophy, not elsewhere classified, multiple sites: Secondary | ICD-10-CM | POA: Diagnosis not present

## 2021-08-12 DIAGNOSIS — I69891 Dysphagia following other cerebrovascular disease: Secondary | ICD-10-CM | POA: Diagnosis not present

## 2021-08-12 DIAGNOSIS — K219 Gastro-esophageal reflux disease without esophagitis: Secondary | ICD-10-CM | POA: Diagnosis not present

## 2021-08-12 DIAGNOSIS — M170 Bilateral primary osteoarthritis of knee: Secondary | ICD-10-CM | POA: Diagnosis not present

## 2021-08-13 DIAGNOSIS — Z736 Limitation of activities due to disability: Secondary | ICD-10-CM | POA: Diagnosis not present

## 2021-08-13 DIAGNOSIS — I63322 Cerebral infarction due to thrombosis of left anterior cerebral artery: Secondary | ICD-10-CM | POA: Diagnosis not present

## 2021-08-13 DIAGNOSIS — I69320 Aphasia following cerebral infarction: Secondary | ICD-10-CM | POA: Diagnosis not present

## 2021-08-13 DIAGNOSIS — I69891 Dysphagia following other cerebrovascular disease: Secondary | ICD-10-CM | POA: Diagnosis not present

## 2021-08-13 DIAGNOSIS — R4701 Aphasia: Secondary | ICD-10-CM | POA: Diagnosis not present

## 2021-08-13 DIAGNOSIS — G8103 Flaccid hemiplegia affecting right nondominant side: Secondary | ICD-10-CM | POA: Diagnosis not present

## 2021-08-14 DIAGNOSIS — I69891 Dysphagia following other cerebrovascular disease: Secondary | ICD-10-CM | POA: Diagnosis not present

## 2021-08-14 DIAGNOSIS — I69320 Aphasia following cerebral infarction: Secondary | ICD-10-CM | POA: Diagnosis not present

## 2021-08-14 DIAGNOSIS — G8103 Flaccid hemiplegia affecting right nondominant side: Secondary | ICD-10-CM | POA: Diagnosis not present

## 2021-08-14 DIAGNOSIS — R4701 Aphasia: Secondary | ICD-10-CM | POA: Diagnosis not present

## 2021-08-14 DIAGNOSIS — I63322 Cerebral infarction due to thrombosis of left anterior cerebral artery: Secondary | ICD-10-CM | POA: Diagnosis not present

## 2021-08-14 DIAGNOSIS — Z736 Limitation of activities due to disability: Secondary | ICD-10-CM | POA: Diagnosis not present

## 2021-08-15 DIAGNOSIS — I69891 Dysphagia following other cerebrovascular disease: Secondary | ICD-10-CM | POA: Diagnosis not present

## 2021-08-15 DIAGNOSIS — I69320 Aphasia following cerebral infarction: Secondary | ICD-10-CM | POA: Diagnosis not present

## 2021-08-15 DIAGNOSIS — G8103 Flaccid hemiplegia affecting right nondominant side: Secondary | ICD-10-CM | POA: Diagnosis not present

## 2021-08-15 DIAGNOSIS — R4701 Aphasia: Secondary | ICD-10-CM | POA: Diagnosis not present

## 2021-08-15 DIAGNOSIS — Z736 Limitation of activities due to disability: Secondary | ICD-10-CM | POA: Diagnosis not present

## 2021-08-15 DIAGNOSIS — I63322 Cerebral infarction due to thrombosis of left anterior cerebral artery: Secondary | ICD-10-CM | POA: Diagnosis not present

## 2021-08-16 DIAGNOSIS — I69891 Dysphagia following other cerebrovascular disease: Secondary | ICD-10-CM | POA: Diagnosis not present

## 2021-08-16 DIAGNOSIS — I63322 Cerebral infarction due to thrombosis of left anterior cerebral artery: Secondary | ICD-10-CM | POA: Diagnosis not present

## 2021-08-16 DIAGNOSIS — G8103 Flaccid hemiplegia affecting right nondominant side: Secondary | ICD-10-CM | POA: Diagnosis not present

## 2021-08-16 DIAGNOSIS — I69320 Aphasia following cerebral infarction: Secondary | ICD-10-CM | POA: Diagnosis not present

## 2021-08-16 DIAGNOSIS — Z736 Limitation of activities due to disability: Secondary | ICD-10-CM | POA: Diagnosis not present

## 2021-08-16 DIAGNOSIS — R4701 Aphasia: Secondary | ICD-10-CM | POA: Diagnosis not present

## 2021-08-18 DIAGNOSIS — R4701 Aphasia: Secondary | ICD-10-CM | POA: Diagnosis not present

## 2021-08-18 DIAGNOSIS — I69320 Aphasia following cerebral infarction: Secondary | ICD-10-CM | POA: Diagnosis not present

## 2021-08-18 DIAGNOSIS — I69891 Dysphagia following other cerebrovascular disease: Secondary | ICD-10-CM | POA: Diagnosis not present

## 2021-08-18 DIAGNOSIS — G8103 Flaccid hemiplegia affecting right nondominant side: Secondary | ICD-10-CM | POA: Diagnosis not present

## 2021-08-18 DIAGNOSIS — I63322 Cerebral infarction due to thrombosis of left anterior cerebral artery: Secondary | ICD-10-CM | POA: Diagnosis not present

## 2021-08-18 DIAGNOSIS — Z736 Limitation of activities due to disability: Secondary | ICD-10-CM | POA: Diagnosis not present

## 2021-08-19 DIAGNOSIS — I69891 Dysphagia following other cerebrovascular disease: Secondary | ICD-10-CM | POA: Diagnosis not present

## 2021-08-19 DIAGNOSIS — I69320 Aphasia following cerebral infarction: Secondary | ICD-10-CM | POA: Diagnosis not present

## 2021-08-19 DIAGNOSIS — G8103 Flaccid hemiplegia affecting right nondominant side: Secondary | ICD-10-CM | POA: Diagnosis not present

## 2021-08-19 DIAGNOSIS — Z736 Limitation of activities due to disability: Secondary | ICD-10-CM | POA: Diagnosis not present

## 2021-08-19 DIAGNOSIS — I63322 Cerebral infarction due to thrombosis of left anterior cerebral artery: Secondary | ICD-10-CM | POA: Diagnosis not present

## 2021-08-19 DIAGNOSIS — R4701 Aphasia: Secondary | ICD-10-CM | POA: Diagnosis not present

## 2021-08-20 DIAGNOSIS — G8103 Flaccid hemiplegia affecting right nondominant side: Secondary | ICD-10-CM | POA: Diagnosis not present

## 2021-08-20 DIAGNOSIS — R4701 Aphasia: Secondary | ICD-10-CM | POA: Diagnosis not present

## 2021-08-20 DIAGNOSIS — I69320 Aphasia following cerebral infarction: Secondary | ICD-10-CM | POA: Diagnosis not present

## 2021-08-20 DIAGNOSIS — I69891 Dysphagia following other cerebrovascular disease: Secondary | ICD-10-CM | POA: Diagnosis not present

## 2021-08-20 DIAGNOSIS — I63322 Cerebral infarction due to thrombosis of left anterior cerebral artery: Secondary | ICD-10-CM | POA: Diagnosis not present

## 2021-08-20 DIAGNOSIS — Z736 Limitation of activities due to disability: Secondary | ICD-10-CM | POA: Diagnosis not present

## 2021-08-21 DIAGNOSIS — I69320 Aphasia following cerebral infarction: Secondary | ICD-10-CM | POA: Diagnosis not present

## 2021-08-21 DIAGNOSIS — R4701 Aphasia: Secondary | ICD-10-CM | POA: Diagnosis not present

## 2021-08-21 DIAGNOSIS — I69891 Dysphagia following other cerebrovascular disease: Secondary | ICD-10-CM | POA: Diagnosis not present

## 2021-08-21 DIAGNOSIS — I63322 Cerebral infarction due to thrombosis of left anterior cerebral artery: Secondary | ICD-10-CM | POA: Diagnosis not present

## 2021-08-21 DIAGNOSIS — G8103 Flaccid hemiplegia affecting right nondominant side: Secondary | ICD-10-CM | POA: Diagnosis not present

## 2021-08-21 DIAGNOSIS — Z736 Limitation of activities due to disability: Secondary | ICD-10-CM | POA: Diagnosis not present

## 2021-08-22 DIAGNOSIS — I69320 Aphasia following cerebral infarction: Secondary | ICD-10-CM | POA: Diagnosis not present

## 2021-08-22 DIAGNOSIS — I63322 Cerebral infarction due to thrombosis of left anterior cerebral artery: Secondary | ICD-10-CM | POA: Diagnosis not present

## 2021-08-22 DIAGNOSIS — R4701 Aphasia: Secondary | ICD-10-CM | POA: Diagnosis not present

## 2021-08-22 DIAGNOSIS — Z736 Limitation of activities due to disability: Secondary | ICD-10-CM | POA: Diagnosis not present

## 2021-08-22 DIAGNOSIS — I69891 Dysphagia following other cerebrovascular disease: Secondary | ICD-10-CM | POA: Diagnosis not present

## 2021-08-22 DIAGNOSIS — G8103 Flaccid hemiplegia affecting right nondominant side: Secondary | ICD-10-CM | POA: Diagnosis not present

## 2021-08-23 DIAGNOSIS — R4701 Aphasia: Secondary | ICD-10-CM | POA: Diagnosis not present

## 2021-08-23 DIAGNOSIS — Z736 Limitation of activities due to disability: Secondary | ICD-10-CM | POA: Diagnosis not present

## 2021-08-23 DIAGNOSIS — I69320 Aphasia following cerebral infarction: Secondary | ICD-10-CM | POA: Diagnosis not present

## 2021-08-23 DIAGNOSIS — G8103 Flaccid hemiplegia affecting right nondominant side: Secondary | ICD-10-CM | POA: Diagnosis not present

## 2021-08-23 DIAGNOSIS — I63322 Cerebral infarction due to thrombosis of left anterior cerebral artery: Secondary | ICD-10-CM | POA: Diagnosis not present

## 2021-08-23 DIAGNOSIS — I69891 Dysphagia following other cerebrovascular disease: Secondary | ICD-10-CM | POA: Diagnosis not present

## 2021-08-26 DIAGNOSIS — R4701 Aphasia: Secondary | ICD-10-CM | POA: Diagnosis not present

## 2021-08-26 DIAGNOSIS — I69320 Aphasia following cerebral infarction: Secondary | ICD-10-CM | POA: Diagnosis not present

## 2021-08-26 DIAGNOSIS — I63322 Cerebral infarction due to thrombosis of left anterior cerebral artery: Secondary | ICD-10-CM | POA: Diagnosis not present

## 2021-08-26 DIAGNOSIS — G8103 Flaccid hemiplegia affecting right nondominant side: Secondary | ICD-10-CM | POA: Diagnosis not present

## 2021-08-26 DIAGNOSIS — I69891 Dysphagia following other cerebrovascular disease: Secondary | ICD-10-CM | POA: Diagnosis not present

## 2021-08-26 DIAGNOSIS — Z736 Limitation of activities due to disability: Secondary | ICD-10-CM | POA: Diagnosis not present

## 2021-08-27 DIAGNOSIS — R4701 Aphasia: Secondary | ICD-10-CM | POA: Diagnosis not present

## 2021-08-27 DIAGNOSIS — G8103 Flaccid hemiplegia affecting right nondominant side: Secondary | ICD-10-CM | POA: Diagnosis not present

## 2021-08-27 DIAGNOSIS — I69891 Dysphagia following other cerebrovascular disease: Secondary | ICD-10-CM | POA: Diagnosis not present

## 2021-08-27 DIAGNOSIS — Z736 Limitation of activities due to disability: Secondary | ICD-10-CM | POA: Diagnosis not present

## 2021-08-27 DIAGNOSIS — I69320 Aphasia following cerebral infarction: Secondary | ICD-10-CM | POA: Diagnosis not present

## 2021-08-27 DIAGNOSIS — I63322 Cerebral infarction due to thrombosis of left anterior cerebral artery: Secondary | ICD-10-CM | POA: Diagnosis not present

## 2021-08-28 DIAGNOSIS — I69320 Aphasia following cerebral infarction: Secondary | ICD-10-CM | POA: Diagnosis not present

## 2021-08-28 DIAGNOSIS — I69891 Dysphagia following other cerebrovascular disease: Secondary | ICD-10-CM | POA: Diagnosis not present

## 2021-08-28 DIAGNOSIS — R4701 Aphasia: Secondary | ICD-10-CM | POA: Diagnosis not present

## 2021-08-28 DIAGNOSIS — Z736 Limitation of activities due to disability: Secondary | ICD-10-CM | POA: Diagnosis not present

## 2021-08-28 DIAGNOSIS — I63322 Cerebral infarction due to thrombosis of left anterior cerebral artery: Secondary | ICD-10-CM | POA: Diagnosis not present

## 2021-08-28 DIAGNOSIS — G8103 Flaccid hemiplegia affecting right nondominant side: Secondary | ICD-10-CM | POA: Diagnosis not present

## 2021-08-29 DIAGNOSIS — Z736 Limitation of activities due to disability: Secondary | ICD-10-CM | POA: Diagnosis not present

## 2021-08-29 DIAGNOSIS — I69891 Dysphagia following other cerebrovascular disease: Secondary | ICD-10-CM | POA: Diagnosis not present

## 2021-08-29 DIAGNOSIS — G8103 Flaccid hemiplegia affecting right nondominant side: Secondary | ICD-10-CM | POA: Diagnosis not present

## 2021-08-29 DIAGNOSIS — I63322 Cerebral infarction due to thrombosis of left anterior cerebral artery: Secondary | ICD-10-CM | POA: Diagnosis not present

## 2021-08-29 DIAGNOSIS — R4701 Aphasia: Secondary | ICD-10-CM | POA: Diagnosis not present

## 2021-08-29 DIAGNOSIS — I69320 Aphasia following cerebral infarction: Secondary | ICD-10-CM | POA: Diagnosis not present

## 2021-08-30 DIAGNOSIS — R4701 Aphasia: Secondary | ICD-10-CM | POA: Diagnosis not present

## 2021-08-30 DIAGNOSIS — I63322 Cerebral infarction due to thrombosis of left anterior cerebral artery: Secondary | ICD-10-CM | POA: Diagnosis not present

## 2021-08-30 DIAGNOSIS — I69320 Aphasia following cerebral infarction: Secondary | ICD-10-CM | POA: Diagnosis not present

## 2021-08-30 DIAGNOSIS — G8103 Flaccid hemiplegia affecting right nondominant side: Secondary | ICD-10-CM | POA: Diagnosis not present

## 2021-08-30 DIAGNOSIS — Z736 Limitation of activities due to disability: Secondary | ICD-10-CM | POA: Diagnosis not present

## 2021-08-30 DIAGNOSIS — I69891 Dysphagia following other cerebrovascular disease: Secondary | ICD-10-CM | POA: Diagnosis not present

## 2021-09-02 DIAGNOSIS — I63322 Cerebral infarction due to thrombosis of left anterior cerebral artery: Secondary | ICD-10-CM | POA: Diagnosis not present

## 2021-09-02 DIAGNOSIS — Z736 Limitation of activities due to disability: Secondary | ICD-10-CM | POA: Diagnosis not present

## 2021-09-02 DIAGNOSIS — G8103 Flaccid hemiplegia affecting right nondominant side: Secondary | ICD-10-CM | POA: Diagnosis not present

## 2021-09-02 DIAGNOSIS — R4701 Aphasia: Secondary | ICD-10-CM | POA: Diagnosis not present

## 2021-09-02 DIAGNOSIS — I69320 Aphasia following cerebral infarction: Secondary | ICD-10-CM | POA: Diagnosis not present

## 2021-09-02 DIAGNOSIS — I69891 Dysphagia following other cerebrovascular disease: Secondary | ICD-10-CM | POA: Diagnosis not present

## 2021-09-03 DIAGNOSIS — R4701 Aphasia: Secondary | ICD-10-CM | POA: Diagnosis not present

## 2021-09-03 DIAGNOSIS — I69320 Aphasia following cerebral infarction: Secondary | ICD-10-CM | POA: Diagnosis not present

## 2021-09-03 DIAGNOSIS — N179 Acute kidney failure, unspecified: Secondary | ICD-10-CM | POA: Diagnosis not present

## 2021-09-03 DIAGNOSIS — I63322 Cerebral infarction due to thrombosis of left anterior cerebral artery: Secondary | ICD-10-CM | POA: Diagnosis not present

## 2021-09-03 DIAGNOSIS — E785 Hyperlipidemia, unspecified: Secondary | ICD-10-CM | POA: Diagnosis not present

## 2021-09-03 DIAGNOSIS — I69891 Dysphagia following other cerebrovascular disease: Secondary | ICD-10-CM | POA: Diagnosis not present

## 2021-09-03 DIAGNOSIS — Z736 Limitation of activities due to disability: Secondary | ICD-10-CM | POA: Diagnosis not present

## 2021-09-03 DIAGNOSIS — I69951 Hemiplegia and hemiparesis following unspecified cerebrovascular disease affecting right dominant side: Secondary | ICD-10-CM | POA: Diagnosis not present

## 2021-09-03 DIAGNOSIS — G8103 Flaccid hemiplegia affecting right nondominant side: Secondary | ICD-10-CM | POA: Diagnosis not present

## 2021-09-03 DIAGNOSIS — I10 Essential (primary) hypertension: Secondary | ICD-10-CM | POA: Diagnosis not present

## 2021-09-04 DIAGNOSIS — R4701 Aphasia: Secondary | ICD-10-CM | POA: Diagnosis not present

## 2021-09-04 DIAGNOSIS — I69891 Dysphagia following other cerebrovascular disease: Secondary | ICD-10-CM | POA: Diagnosis not present

## 2021-09-04 DIAGNOSIS — I63322 Cerebral infarction due to thrombosis of left anterior cerebral artery: Secondary | ICD-10-CM | POA: Diagnosis not present

## 2021-09-04 DIAGNOSIS — G8103 Flaccid hemiplegia affecting right nondominant side: Secondary | ICD-10-CM | POA: Diagnosis not present

## 2021-09-04 DIAGNOSIS — I69320 Aphasia following cerebral infarction: Secondary | ICD-10-CM | POA: Diagnosis not present

## 2021-09-04 DIAGNOSIS — Z736 Limitation of activities due to disability: Secondary | ICD-10-CM | POA: Diagnosis not present

## 2021-09-05 DIAGNOSIS — R4701 Aphasia: Secondary | ICD-10-CM | POA: Diagnosis not present

## 2021-09-05 DIAGNOSIS — G8103 Flaccid hemiplegia affecting right nondominant side: Secondary | ICD-10-CM | POA: Diagnosis not present

## 2021-09-05 DIAGNOSIS — I69320 Aphasia following cerebral infarction: Secondary | ICD-10-CM | POA: Diagnosis not present

## 2021-09-05 DIAGNOSIS — I63322 Cerebral infarction due to thrombosis of left anterior cerebral artery: Secondary | ICD-10-CM | POA: Diagnosis not present

## 2021-09-05 DIAGNOSIS — I69891 Dysphagia following other cerebrovascular disease: Secondary | ICD-10-CM | POA: Diagnosis not present

## 2021-09-05 DIAGNOSIS — Z79899 Other long term (current) drug therapy: Secondary | ICD-10-CM | POA: Diagnosis not present

## 2021-09-05 DIAGNOSIS — Z736 Limitation of activities due to disability: Secondary | ICD-10-CM | POA: Diagnosis not present

## 2021-09-05 DIAGNOSIS — N183 Chronic kidney disease, stage 3 unspecified: Secondary | ICD-10-CM | POA: Diagnosis not present

## 2021-09-06 DIAGNOSIS — G8103 Flaccid hemiplegia affecting right nondominant side: Secondary | ICD-10-CM | POA: Diagnosis not present

## 2021-09-06 DIAGNOSIS — I63322 Cerebral infarction due to thrombosis of left anterior cerebral artery: Secondary | ICD-10-CM | POA: Diagnosis not present

## 2021-09-06 DIAGNOSIS — I69891 Dysphagia following other cerebrovascular disease: Secondary | ICD-10-CM | POA: Diagnosis not present

## 2021-09-06 DIAGNOSIS — R4701 Aphasia: Secondary | ICD-10-CM | POA: Diagnosis not present

## 2021-09-06 DIAGNOSIS — Z736 Limitation of activities due to disability: Secondary | ICD-10-CM | POA: Diagnosis not present

## 2021-09-06 DIAGNOSIS — I69320 Aphasia following cerebral infarction: Secondary | ICD-10-CM | POA: Diagnosis not present

## 2021-09-07 DIAGNOSIS — I63322 Cerebral infarction due to thrombosis of left anterior cerebral artery: Secondary | ICD-10-CM | POA: Diagnosis not present

## 2021-09-07 DIAGNOSIS — I69891 Dysphagia following other cerebrovascular disease: Secondary | ICD-10-CM | POA: Diagnosis not present

## 2021-09-07 DIAGNOSIS — R4701 Aphasia: Secondary | ICD-10-CM | POA: Diagnosis not present

## 2021-09-07 DIAGNOSIS — I69320 Aphasia following cerebral infarction: Secondary | ICD-10-CM | POA: Diagnosis not present

## 2021-09-07 DIAGNOSIS — G8103 Flaccid hemiplegia affecting right nondominant side: Secondary | ICD-10-CM | POA: Diagnosis not present

## 2021-09-07 DIAGNOSIS — Z736 Limitation of activities due to disability: Secondary | ICD-10-CM | POA: Diagnosis not present

## 2021-09-09 DIAGNOSIS — R4701 Aphasia: Secondary | ICD-10-CM | POA: Diagnosis not present

## 2021-09-09 DIAGNOSIS — Z736 Limitation of activities due to disability: Secondary | ICD-10-CM | POA: Diagnosis not present

## 2021-09-09 DIAGNOSIS — I69320 Aphasia following cerebral infarction: Secondary | ICD-10-CM | POA: Diagnosis not present

## 2021-09-09 DIAGNOSIS — I63322 Cerebral infarction due to thrombosis of left anterior cerebral artery: Secondary | ICD-10-CM | POA: Diagnosis not present

## 2021-09-09 DIAGNOSIS — I69891 Dysphagia following other cerebrovascular disease: Secondary | ICD-10-CM | POA: Diagnosis not present

## 2021-09-09 DIAGNOSIS — G8103 Flaccid hemiplegia affecting right nondominant side: Secondary | ICD-10-CM | POA: Diagnosis not present

## 2021-09-10 DIAGNOSIS — G20 Parkinson's disease: Secondary | ICD-10-CM | POA: Diagnosis not present

## 2021-09-10 DIAGNOSIS — R627 Adult failure to thrive: Secondary | ICD-10-CM | POA: Diagnosis not present

## 2021-09-10 DIAGNOSIS — Z736 Limitation of activities due to disability: Secondary | ICD-10-CM | POA: Diagnosis not present

## 2021-09-10 DIAGNOSIS — E538 Deficiency of other specified B group vitamins: Secondary | ICD-10-CM | POA: Diagnosis not present

## 2021-09-10 DIAGNOSIS — I69891 Dysphagia following other cerebrovascular disease: Secondary | ICD-10-CM | POA: Diagnosis not present

## 2021-09-10 DIAGNOSIS — E559 Vitamin D deficiency, unspecified: Secondary | ICD-10-CM | POA: Diagnosis not present

## 2021-09-10 DIAGNOSIS — M6259 Muscle wasting and atrophy, not elsewhere classified, multiple sites: Secondary | ICD-10-CM | POA: Diagnosis not present

## 2021-09-10 DIAGNOSIS — Z7409 Other reduced mobility: Secondary | ICD-10-CM | POA: Diagnosis not present

## 2021-09-10 DIAGNOSIS — R4701 Aphasia: Secondary | ICD-10-CM | POA: Diagnosis not present

## 2021-09-10 DIAGNOSIS — N390 Urinary tract infection, site not specified: Secondary | ICD-10-CM | POA: Diagnosis not present

## 2021-09-10 DIAGNOSIS — N183 Chronic kidney disease, stage 3 unspecified: Secondary | ICD-10-CM | POA: Diagnosis not present

## 2021-09-10 DIAGNOSIS — M8580 Other specified disorders of bone density and structure, unspecified site: Secondary | ICD-10-CM | POA: Diagnosis not present

## 2021-09-10 DIAGNOSIS — I10 Essential (primary) hypertension: Secondary | ICD-10-CM | POA: Diagnosis not present

## 2021-09-10 DIAGNOSIS — R1319 Other dysphagia: Secondary | ICD-10-CM | POA: Diagnosis not present

## 2021-09-10 DIAGNOSIS — Z8616 Personal history of COVID-19: Secondary | ICD-10-CM | POA: Diagnosis not present

## 2021-09-10 DIAGNOSIS — K219 Gastro-esophageal reflux disease without esophagitis: Secondary | ICD-10-CM | POA: Diagnosis not present

## 2021-09-10 DIAGNOSIS — I69320 Aphasia following cerebral infarction: Secondary | ICD-10-CM | POA: Diagnosis not present

## 2021-09-10 DIAGNOSIS — R41841 Cognitive communication deficit: Secondary | ICD-10-CM | POA: Diagnosis not present

## 2021-09-10 DIAGNOSIS — E785 Hyperlipidemia, unspecified: Secondary | ICD-10-CM | POA: Diagnosis not present

## 2021-09-10 DIAGNOSIS — G8103 Flaccid hemiplegia affecting right nondominant side: Secondary | ICD-10-CM | POA: Diagnosis not present

## 2021-09-10 DIAGNOSIS — M170 Bilateral primary osteoarthritis of knee: Secondary | ICD-10-CM | POA: Diagnosis not present

## 2021-09-10 DIAGNOSIS — I63322 Cerebral infarction due to thrombosis of left anterior cerebral artery: Secondary | ICD-10-CM | POA: Diagnosis not present

## 2021-09-11 DIAGNOSIS — I69320 Aphasia following cerebral infarction: Secondary | ICD-10-CM | POA: Diagnosis not present

## 2021-09-11 DIAGNOSIS — Z736 Limitation of activities due to disability: Secondary | ICD-10-CM | POA: Diagnosis not present

## 2021-09-11 DIAGNOSIS — G20 Parkinson's disease: Secondary | ICD-10-CM | POA: Diagnosis not present

## 2021-09-11 DIAGNOSIS — I69891 Dysphagia following other cerebrovascular disease: Secondary | ICD-10-CM | POA: Diagnosis not present

## 2021-09-11 DIAGNOSIS — I63322 Cerebral infarction due to thrombosis of left anterior cerebral artery: Secondary | ICD-10-CM | POA: Diagnosis not present

## 2021-09-11 DIAGNOSIS — G8103 Flaccid hemiplegia affecting right nondominant side: Secondary | ICD-10-CM | POA: Diagnosis not present

## 2021-09-12 DIAGNOSIS — G8103 Flaccid hemiplegia affecting right nondominant side: Secondary | ICD-10-CM | POA: Diagnosis not present

## 2021-09-12 DIAGNOSIS — I69320 Aphasia following cerebral infarction: Secondary | ICD-10-CM | POA: Diagnosis not present

## 2021-09-12 DIAGNOSIS — Z736 Limitation of activities due to disability: Secondary | ICD-10-CM | POA: Diagnosis not present

## 2021-09-12 DIAGNOSIS — G20 Parkinson's disease: Secondary | ICD-10-CM | POA: Diagnosis not present

## 2021-09-12 DIAGNOSIS — I69891 Dysphagia following other cerebrovascular disease: Secondary | ICD-10-CM | POA: Diagnosis not present

## 2021-09-12 DIAGNOSIS — I63322 Cerebral infarction due to thrombosis of left anterior cerebral artery: Secondary | ICD-10-CM | POA: Diagnosis not present

## 2021-09-13 DIAGNOSIS — G20 Parkinson's disease: Secondary | ICD-10-CM | POA: Diagnosis not present

## 2021-09-13 DIAGNOSIS — G8103 Flaccid hemiplegia affecting right nondominant side: Secondary | ICD-10-CM | POA: Diagnosis not present

## 2021-09-13 DIAGNOSIS — I69891 Dysphagia following other cerebrovascular disease: Secondary | ICD-10-CM | POA: Diagnosis not present

## 2021-09-13 DIAGNOSIS — I69320 Aphasia following cerebral infarction: Secondary | ICD-10-CM | POA: Diagnosis not present

## 2021-09-13 DIAGNOSIS — I63322 Cerebral infarction due to thrombosis of left anterior cerebral artery: Secondary | ICD-10-CM | POA: Diagnosis not present

## 2021-09-13 DIAGNOSIS — Z736 Limitation of activities due to disability: Secondary | ICD-10-CM | POA: Diagnosis not present

## 2021-09-16 DIAGNOSIS — Z736 Limitation of activities due to disability: Secondary | ICD-10-CM | POA: Diagnosis not present

## 2021-09-16 DIAGNOSIS — G8103 Flaccid hemiplegia affecting right nondominant side: Secondary | ICD-10-CM | POA: Diagnosis not present

## 2021-09-16 DIAGNOSIS — I69320 Aphasia following cerebral infarction: Secondary | ICD-10-CM | POA: Diagnosis not present

## 2021-09-16 DIAGNOSIS — G20 Parkinson's disease: Secondary | ICD-10-CM | POA: Diagnosis not present

## 2021-09-16 DIAGNOSIS — I63322 Cerebral infarction due to thrombosis of left anterior cerebral artery: Secondary | ICD-10-CM | POA: Diagnosis not present

## 2021-09-16 DIAGNOSIS — I69891 Dysphagia following other cerebrovascular disease: Secondary | ICD-10-CM | POA: Diagnosis not present

## 2021-09-17 DIAGNOSIS — I69320 Aphasia following cerebral infarction: Secondary | ICD-10-CM | POA: Diagnosis not present

## 2021-09-17 DIAGNOSIS — I69891 Dysphagia following other cerebrovascular disease: Secondary | ICD-10-CM | POA: Diagnosis not present

## 2021-09-17 DIAGNOSIS — I63322 Cerebral infarction due to thrombosis of left anterior cerebral artery: Secondary | ICD-10-CM | POA: Diagnosis not present

## 2021-09-17 DIAGNOSIS — G20 Parkinson's disease: Secondary | ICD-10-CM | POA: Diagnosis not present

## 2021-09-17 DIAGNOSIS — Z736 Limitation of activities due to disability: Secondary | ICD-10-CM | POA: Diagnosis not present

## 2021-09-17 DIAGNOSIS — G8103 Flaccid hemiplegia affecting right nondominant side: Secondary | ICD-10-CM | POA: Diagnosis not present

## 2021-09-18 DIAGNOSIS — Z736 Limitation of activities due to disability: Secondary | ICD-10-CM | POA: Diagnosis not present

## 2021-09-18 DIAGNOSIS — I63322 Cerebral infarction due to thrombosis of left anterior cerebral artery: Secondary | ICD-10-CM | POA: Diagnosis not present

## 2021-09-18 DIAGNOSIS — I69320 Aphasia following cerebral infarction: Secondary | ICD-10-CM | POA: Diagnosis not present

## 2021-09-18 DIAGNOSIS — G20 Parkinson's disease: Secondary | ICD-10-CM | POA: Diagnosis not present

## 2021-09-18 DIAGNOSIS — I69891 Dysphagia following other cerebrovascular disease: Secondary | ICD-10-CM | POA: Diagnosis not present

## 2021-09-18 DIAGNOSIS — G8103 Flaccid hemiplegia affecting right nondominant side: Secondary | ICD-10-CM | POA: Diagnosis not present

## 2021-09-19 DIAGNOSIS — I69891 Dysphagia following other cerebrovascular disease: Secondary | ICD-10-CM | POA: Diagnosis not present

## 2021-09-19 DIAGNOSIS — I69320 Aphasia following cerebral infarction: Secondary | ICD-10-CM | POA: Diagnosis not present

## 2021-09-19 DIAGNOSIS — G8103 Flaccid hemiplegia affecting right nondominant side: Secondary | ICD-10-CM | POA: Diagnosis not present

## 2021-09-19 DIAGNOSIS — Z736 Limitation of activities due to disability: Secondary | ICD-10-CM | POA: Diagnosis not present

## 2021-09-19 DIAGNOSIS — G20 Parkinson's disease: Secondary | ICD-10-CM | POA: Diagnosis not present

## 2021-09-19 DIAGNOSIS — I63322 Cerebral infarction due to thrombosis of left anterior cerebral artery: Secondary | ICD-10-CM | POA: Diagnosis not present

## 2021-09-20 DIAGNOSIS — G8103 Flaccid hemiplegia affecting right nondominant side: Secondary | ICD-10-CM | POA: Diagnosis not present

## 2021-09-20 DIAGNOSIS — I63322 Cerebral infarction due to thrombosis of left anterior cerebral artery: Secondary | ICD-10-CM | POA: Diagnosis not present

## 2021-09-20 DIAGNOSIS — Z736 Limitation of activities due to disability: Secondary | ICD-10-CM | POA: Diagnosis not present

## 2021-09-20 DIAGNOSIS — I69320 Aphasia following cerebral infarction: Secondary | ICD-10-CM | POA: Diagnosis not present

## 2021-09-20 DIAGNOSIS — I69891 Dysphagia following other cerebrovascular disease: Secondary | ICD-10-CM | POA: Diagnosis not present

## 2021-09-20 DIAGNOSIS — G20 Parkinson's disease: Secondary | ICD-10-CM | POA: Diagnosis not present

## 2021-09-23 DIAGNOSIS — I69891 Dysphagia following other cerebrovascular disease: Secondary | ICD-10-CM | POA: Diagnosis not present

## 2021-09-23 DIAGNOSIS — I63322 Cerebral infarction due to thrombosis of left anterior cerebral artery: Secondary | ICD-10-CM | POA: Diagnosis not present

## 2021-09-23 DIAGNOSIS — Z736 Limitation of activities due to disability: Secondary | ICD-10-CM | POA: Diagnosis not present

## 2021-09-23 DIAGNOSIS — G20 Parkinson's disease: Secondary | ICD-10-CM | POA: Diagnosis not present

## 2021-09-23 DIAGNOSIS — I69320 Aphasia following cerebral infarction: Secondary | ICD-10-CM | POA: Diagnosis not present

## 2021-09-23 DIAGNOSIS — G8103 Flaccid hemiplegia affecting right nondominant side: Secondary | ICD-10-CM | POA: Diagnosis not present

## 2021-09-24 DIAGNOSIS — I63322 Cerebral infarction due to thrombosis of left anterior cerebral artery: Secondary | ICD-10-CM | POA: Diagnosis not present

## 2021-09-24 DIAGNOSIS — Z736 Limitation of activities due to disability: Secondary | ICD-10-CM | POA: Diagnosis not present

## 2021-09-24 DIAGNOSIS — G20 Parkinson's disease: Secondary | ICD-10-CM | POA: Diagnosis not present

## 2021-09-24 DIAGNOSIS — G8103 Flaccid hemiplegia affecting right nondominant side: Secondary | ICD-10-CM | POA: Diagnosis not present

## 2021-09-24 DIAGNOSIS — I69320 Aphasia following cerebral infarction: Secondary | ICD-10-CM | POA: Diagnosis not present

## 2021-09-24 DIAGNOSIS — I69891 Dysphagia following other cerebrovascular disease: Secondary | ICD-10-CM | POA: Diagnosis not present

## 2021-09-25 DIAGNOSIS — Z736 Limitation of activities due to disability: Secondary | ICD-10-CM | POA: Diagnosis not present

## 2021-09-25 DIAGNOSIS — I69320 Aphasia following cerebral infarction: Secondary | ICD-10-CM | POA: Diagnosis not present

## 2021-09-25 DIAGNOSIS — G8103 Flaccid hemiplegia affecting right nondominant side: Secondary | ICD-10-CM | POA: Diagnosis not present

## 2021-09-25 DIAGNOSIS — I63322 Cerebral infarction due to thrombosis of left anterior cerebral artery: Secondary | ICD-10-CM | POA: Diagnosis not present

## 2021-09-25 DIAGNOSIS — I69891 Dysphagia following other cerebrovascular disease: Secondary | ICD-10-CM | POA: Diagnosis not present

## 2021-09-25 DIAGNOSIS — G20 Parkinson's disease: Secondary | ICD-10-CM | POA: Diagnosis not present

## 2021-09-26 DIAGNOSIS — I69320 Aphasia following cerebral infarction: Secondary | ICD-10-CM | POA: Diagnosis not present

## 2021-09-26 DIAGNOSIS — G20 Parkinson's disease: Secondary | ICD-10-CM | POA: Diagnosis not present

## 2021-09-26 DIAGNOSIS — I69891 Dysphagia following other cerebrovascular disease: Secondary | ICD-10-CM | POA: Diagnosis not present

## 2021-09-26 DIAGNOSIS — G8103 Flaccid hemiplegia affecting right nondominant side: Secondary | ICD-10-CM | POA: Diagnosis not present

## 2021-09-26 DIAGNOSIS — I63322 Cerebral infarction due to thrombosis of left anterior cerebral artery: Secondary | ICD-10-CM | POA: Diagnosis not present

## 2021-09-26 DIAGNOSIS — Z736 Limitation of activities due to disability: Secondary | ICD-10-CM | POA: Diagnosis not present

## 2021-09-27 DIAGNOSIS — I69320 Aphasia following cerebral infarction: Secondary | ICD-10-CM | POA: Diagnosis not present

## 2021-09-27 DIAGNOSIS — I63322 Cerebral infarction due to thrombosis of left anterior cerebral artery: Secondary | ICD-10-CM | POA: Diagnosis not present

## 2021-09-27 DIAGNOSIS — Z736 Limitation of activities due to disability: Secondary | ICD-10-CM | POA: Diagnosis not present

## 2021-09-27 DIAGNOSIS — G20 Parkinson's disease: Secondary | ICD-10-CM | POA: Diagnosis not present

## 2021-09-27 DIAGNOSIS — G8103 Flaccid hemiplegia affecting right nondominant side: Secondary | ICD-10-CM | POA: Diagnosis not present

## 2021-09-27 DIAGNOSIS — I69891 Dysphagia following other cerebrovascular disease: Secondary | ICD-10-CM | POA: Diagnosis not present

## 2021-09-30 DIAGNOSIS — I69320 Aphasia following cerebral infarction: Secondary | ICD-10-CM | POA: Diagnosis not present

## 2021-09-30 DIAGNOSIS — Z736 Limitation of activities due to disability: Secondary | ICD-10-CM | POA: Diagnosis not present

## 2021-09-30 DIAGNOSIS — I69891 Dysphagia following other cerebrovascular disease: Secondary | ICD-10-CM | POA: Diagnosis not present

## 2021-09-30 DIAGNOSIS — I63322 Cerebral infarction due to thrombosis of left anterior cerebral artery: Secondary | ICD-10-CM | POA: Diagnosis not present

## 2021-09-30 DIAGNOSIS — G8103 Flaccid hemiplegia affecting right nondominant side: Secondary | ICD-10-CM | POA: Diagnosis not present

## 2021-09-30 DIAGNOSIS — G20 Parkinson's disease: Secondary | ICD-10-CM | POA: Diagnosis not present

## 2021-10-01 DIAGNOSIS — I63322 Cerebral infarction due to thrombosis of left anterior cerebral artery: Secondary | ICD-10-CM | POA: Diagnosis not present

## 2021-10-01 DIAGNOSIS — G20 Parkinson's disease: Secondary | ICD-10-CM | POA: Diagnosis not present

## 2021-10-01 DIAGNOSIS — Z736 Limitation of activities due to disability: Secondary | ICD-10-CM | POA: Diagnosis not present

## 2021-10-01 DIAGNOSIS — I69891 Dysphagia following other cerebrovascular disease: Secondary | ICD-10-CM | POA: Diagnosis not present

## 2021-10-01 DIAGNOSIS — I69320 Aphasia following cerebral infarction: Secondary | ICD-10-CM | POA: Diagnosis not present

## 2021-10-01 DIAGNOSIS — G8103 Flaccid hemiplegia affecting right nondominant side: Secondary | ICD-10-CM | POA: Diagnosis not present

## 2021-10-02 DIAGNOSIS — I69320 Aphasia following cerebral infarction: Secondary | ICD-10-CM | POA: Diagnosis not present

## 2021-10-02 DIAGNOSIS — R6889 Other general symptoms and signs: Secondary | ICD-10-CM | POA: Diagnosis not present

## 2021-10-02 DIAGNOSIS — K5900 Constipation, unspecified: Secondary | ICD-10-CM | POA: Diagnosis not present

## 2021-10-02 DIAGNOSIS — I63322 Cerebral infarction due to thrombosis of left anterior cerebral artery: Secondary | ICD-10-CM | POA: Diagnosis not present

## 2021-10-02 DIAGNOSIS — Z736 Limitation of activities due to disability: Secondary | ICD-10-CM | POA: Diagnosis not present

## 2021-10-02 DIAGNOSIS — G20 Parkinson's disease: Secondary | ICD-10-CM | POA: Diagnosis not present

## 2021-10-02 DIAGNOSIS — G8103 Flaccid hemiplegia affecting right nondominant side: Secondary | ICD-10-CM | POA: Diagnosis not present

## 2021-10-02 DIAGNOSIS — I69891 Dysphagia following other cerebrovascular disease: Secondary | ICD-10-CM | POA: Diagnosis not present

## 2021-10-03 DIAGNOSIS — I69891 Dysphagia following other cerebrovascular disease: Secondary | ICD-10-CM | POA: Diagnosis not present

## 2021-10-03 DIAGNOSIS — I63322 Cerebral infarction due to thrombosis of left anterior cerebral artery: Secondary | ICD-10-CM | POA: Diagnosis not present

## 2021-10-03 DIAGNOSIS — G20 Parkinson's disease: Secondary | ICD-10-CM | POA: Diagnosis not present

## 2021-10-03 DIAGNOSIS — G8103 Flaccid hemiplegia affecting right nondominant side: Secondary | ICD-10-CM | POA: Diagnosis not present

## 2021-10-03 DIAGNOSIS — I69320 Aphasia following cerebral infarction: Secondary | ICD-10-CM | POA: Diagnosis not present

## 2021-10-03 DIAGNOSIS — Z736 Limitation of activities due to disability: Secondary | ICD-10-CM | POA: Diagnosis not present

## 2021-10-04 DIAGNOSIS — I63322 Cerebral infarction due to thrombosis of left anterior cerebral artery: Secondary | ICD-10-CM | POA: Diagnosis not present

## 2021-10-04 DIAGNOSIS — G8103 Flaccid hemiplegia affecting right nondominant side: Secondary | ICD-10-CM | POA: Diagnosis not present

## 2021-10-04 DIAGNOSIS — Z736 Limitation of activities due to disability: Secondary | ICD-10-CM | POA: Diagnosis not present

## 2021-10-04 DIAGNOSIS — G20 Parkinson's disease: Secondary | ICD-10-CM | POA: Diagnosis not present

## 2021-10-04 DIAGNOSIS — I69320 Aphasia following cerebral infarction: Secondary | ICD-10-CM | POA: Diagnosis not present

## 2021-10-04 DIAGNOSIS — I69891 Dysphagia following other cerebrovascular disease: Secondary | ICD-10-CM | POA: Diagnosis not present

## 2021-10-06 DIAGNOSIS — G8103 Flaccid hemiplegia affecting right nondominant side: Secondary | ICD-10-CM | POA: Diagnosis not present

## 2021-10-06 DIAGNOSIS — G20 Parkinson's disease: Secondary | ICD-10-CM | POA: Diagnosis not present

## 2021-10-06 DIAGNOSIS — I69320 Aphasia following cerebral infarction: Secondary | ICD-10-CM | POA: Diagnosis not present

## 2021-10-06 DIAGNOSIS — Z736 Limitation of activities due to disability: Secondary | ICD-10-CM | POA: Diagnosis not present

## 2021-10-06 DIAGNOSIS — I69891 Dysphagia following other cerebrovascular disease: Secondary | ICD-10-CM | POA: Diagnosis not present

## 2021-10-06 DIAGNOSIS — I63322 Cerebral infarction due to thrombosis of left anterior cerebral artery: Secondary | ICD-10-CM | POA: Diagnosis not present

## 2021-10-08 DIAGNOSIS — I69320 Aphasia following cerebral infarction: Secondary | ICD-10-CM | POA: Diagnosis not present

## 2021-10-08 DIAGNOSIS — G20 Parkinson's disease: Secondary | ICD-10-CM | POA: Diagnosis not present

## 2021-10-08 DIAGNOSIS — Z736 Limitation of activities due to disability: Secondary | ICD-10-CM | POA: Diagnosis not present

## 2021-10-08 DIAGNOSIS — I63322 Cerebral infarction due to thrombosis of left anterior cerebral artery: Secondary | ICD-10-CM | POA: Diagnosis not present

## 2021-10-08 DIAGNOSIS — I69891 Dysphagia following other cerebrovascular disease: Secondary | ICD-10-CM | POA: Diagnosis not present

## 2021-10-08 DIAGNOSIS — G8103 Flaccid hemiplegia affecting right nondominant side: Secondary | ICD-10-CM | POA: Diagnosis not present

## 2021-10-09 DIAGNOSIS — G20 Parkinson's disease: Secondary | ICD-10-CM | POA: Diagnosis not present

## 2021-10-09 DIAGNOSIS — I63322 Cerebral infarction due to thrombosis of left anterior cerebral artery: Secondary | ICD-10-CM | POA: Diagnosis not present

## 2021-10-09 DIAGNOSIS — I69320 Aphasia following cerebral infarction: Secondary | ICD-10-CM | POA: Diagnosis not present

## 2021-10-09 DIAGNOSIS — Z736 Limitation of activities due to disability: Secondary | ICD-10-CM | POA: Diagnosis not present

## 2021-10-09 DIAGNOSIS — G8103 Flaccid hemiplegia affecting right nondominant side: Secondary | ICD-10-CM | POA: Diagnosis not present

## 2021-10-09 DIAGNOSIS — I69891 Dysphagia following other cerebrovascular disease: Secondary | ICD-10-CM | POA: Diagnosis not present

## 2021-10-10 DIAGNOSIS — Z736 Limitation of activities due to disability: Secondary | ICD-10-CM | POA: Diagnosis not present

## 2021-10-10 DIAGNOSIS — G20 Parkinson's disease: Secondary | ICD-10-CM | POA: Diagnosis not present

## 2021-10-10 DIAGNOSIS — I63322 Cerebral infarction due to thrombosis of left anterior cerebral artery: Secondary | ICD-10-CM | POA: Diagnosis not present

## 2021-10-10 DIAGNOSIS — I69891 Dysphagia following other cerebrovascular disease: Secondary | ICD-10-CM | POA: Diagnosis not present

## 2021-10-10 DIAGNOSIS — I69320 Aphasia following cerebral infarction: Secondary | ICD-10-CM | POA: Diagnosis not present

## 2021-10-10 DIAGNOSIS — G8103 Flaccid hemiplegia affecting right nondominant side: Secondary | ICD-10-CM | POA: Diagnosis not present

## 2021-10-11 DIAGNOSIS — K219 Gastro-esophageal reflux disease without esophagitis: Secondary | ICD-10-CM | POA: Diagnosis not present

## 2021-10-11 DIAGNOSIS — M6259 Muscle wasting and atrophy, not elsewhere classified, multiple sites: Secondary | ICD-10-CM | POA: Diagnosis not present

## 2021-10-11 DIAGNOSIS — N390 Urinary tract infection, site not specified: Secondary | ICD-10-CM | POA: Diagnosis not present

## 2021-10-11 DIAGNOSIS — R1319 Other dysphagia: Secondary | ICD-10-CM | POA: Diagnosis not present

## 2021-10-11 DIAGNOSIS — G8103 Flaccid hemiplegia affecting right nondominant side: Secondary | ICD-10-CM | POA: Diagnosis not present

## 2021-10-11 DIAGNOSIS — M8580 Other specified disorders of bone density and structure, unspecified site: Secondary | ICD-10-CM | POA: Diagnosis not present

## 2021-10-11 DIAGNOSIS — I10 Essential (primary) hypertension: Secondary | ICD-10-CM | POA: Diagnosis not present

## 2021-10-11 DIAGNOSIS — Z8616 Personal history of COVID-19: Secondary | ICD-10-CM | POA: Diagnosis not present

## 2021-10-11 DIAGNOSIS — R4701 Aphasia: Secondary | ICD-10-CM | POA: Diagnosis not present

## 2021-10-11 DIAGNOSIS — R41841 Cognitive communication deficit: Secondary | ICD-10-CM | POA: Diagnosis not present

## 2021-10-11 DIAGNOSIS — I69320 Aphasia following cerebral infarction: Secondary | ICD-10-CM | POA: Diagnosis not present

## 2021-10-11 DIAGNOSIS — E559 Vitamin D deficiency, unspecified: Secondary | ICD-10-CM | POA: Diagnosis not present

## 2021-10-11 DIAGNOSIS — E538 Deficiency of other specified B group vitamins: Secondary | ICD-10-CM | POA: Diagnosis not present

## 2021-10-11 DIAGNOSIS — N183 Chronic kidney disease, stage 3 unspecified: Secondary | ICD-10-CM | POA: Diagnosis not present

## 2021-10-11 DIAGNOSIS — Z7409 Other reduced mobility: Secondary | ICD-10-CM | POA: Diagnosis not present

## 2021-10-11 DIAGNOSIS — I63322 Cerebral infarction due to thrombosis of left anterior cerebral artery: Secondary | ICD-10-CM | POA: Diagnosis not present

## 2021-10-11 DIAGNOSIS — E785 Hyperlipidemia, unspecified: Secondary | ICD-10-CM | POA: Diagnosis not present

## 2021-10-11 DIAGNOSIS — R627 Adult failure to thrive: Secondary | ICD-10-CM | POA: Diagnosis not present

## 2021-10-11 DIAGNOSIS — I69891 Dysphagia following other cerebrovascular disease: Secondary | ICD-10-CM | POA: Diagnosis not present

## 2021-10-11 DIAGNOSIS — Z736 Limitation of activities due to disability: Secondary | ICD-10-CM | POA: Diagnosis not present

## 2021-10-11 DIAGNOSIS — M170 Bilateral primary osteoarthritis of knee: Secondary | ICD-10-CM | POA: Diagnosis not present

## 2021-10-11 DIAGNOSIS — G20 Parkinson's disease: Secondary | ICD-10-CM | POA: Diagnosis not present

## 2021-10-14 DIAGNOSIS — I69891 Dysphagia following other cerebrovascular disease: Secondary | ICD-10-CM | POA: Diagnosis not present

## 2021-10-14 DIAGNOSIS — G8103 Flaccid hemiplegia affecting right nondominant side: Secondary | ICD-10-CM | POA: Diagnosis not present

## 2021-10-14 DIAGNOSIS — I63322 Cerebral infarction due to thrombosis of left anterior cerebral artery: Secondary | ICD-10-CM | POA: Diagnosis not present

## 2021-10-14 DIAGNOSIS — G20 Parkinson's disease: Secondary | ICD-10-CM | POA: Diagnosis not present

## 2021-10-14 DIAGNOSIS — I69320 Aphasia following cerebral infarction: Secondary | ICD-10-CM | POA: Diagnosis not present

## 2021-10-14 DIAGNOSIS — Z736 Limitation of activities due to disability: Secondary | ICD-10-CM | POA: Diagnosis not present

## 2021-10-15 DIAGNOSIS — I10 Essential (primary) hypertension: Secondary | ICD-10-CM | POA: Diagnosis not present

## 2021-10-15 DIAGNOSIS — K5900 Constipation, unspecified: Secondary | ICD-10-CM | POA: Diagnosis not present

## 2021-10-15 DIAGNOSIS — Z736 Limitation of activities due to disability: Secondary | ICD-10-CM | POA: Diagnosis not present

## 2021-10-15 DIAGNOSIS — I69891 Dysphagia following other cerebrovascular disease: Secondary | ICD-10-CM | POA: Diagnosis not present

## 2021-10-15 DIAGNOSIS — I63322 Cerebral infarction due to thrombosis of left anterior cerebral artery: Secondary | ICD-10-CM | POA: Diagnosis not present

## 2021-10-15 DIAGNOSIS — E785 Hyperlipidemia, unspecified: Secondary | ICD-10-CM | POA: Diagnosis not present

## 2021-10-15 DIAGNOSIS — G20 Parkinson's disease: Secondary | ICD-10-CM | POA: Diagnosis not present

## 2021-10-15 DIAGNOSIS — I69320 Aphasia following cerebral infarction: Secondary | ICD-10-CM | POA: Diagnosis not present

## 2021-10-15 DIAGNOSIS — G8103 Flaccid hemiplegia affecting right nondominant side: Secondary | ICD-10-CM | POA: Diagnosis not present

## 2021-10-16 DIAGNOSIS — I63322 Cerebral infarction due to thrombosis of left anterior cerebral artery: Secondary | ICD-10-CM | POA: Diagnosis not present

## 2021-10-16 DIAGNOSIS — G8103 Flaccid hemiplegia affecting right nondominant side: Secondary | ICD-10-CM | POA: Diagnosis not present

## 2021-10-16 DIAGNOSIS — Z736 Limitation of activities due to disability: Secondary | ICD-10-CM | POA: Diagnosis not present

## 2021-10-16 DIAGNOSIS — G20 Parkinson's disease: Secondary | ICD-10-CM | POA: Diagnosis not present

## 2021-10-16 DIAGNOSIS — I69891 Dysphagia following other cerebrovascular disease: Secondary | ICD-10-CM | POA: Diagnosis not present

## 2021-10-16 DIAGNOSIS — I69320 Aphasia following cerebral infarction: Secondary | ICD-10-CM | POA: Diagnosis not present

## 2021-10-17 DIAGNOSIS — I63322 Cerebral infarction due to thrombosis of left anterior cerebral artery: Secondary | ICD-10-CM | POA: Diagnosis not present

## 2021-10-17 DIAGNOSIS — Z79899 Other long term (current) drug therapy: Secondary | ICD-10-CM | POA: Diagnosis not present

## 2021-10-17 DIAGNOSIS — G20 Parkinson's disease: Secondary | ICD-10-CM | POA: Diagnosis not present

## 2021-10-17 DIAGNOSIS — Z736 Limitation of activities due to disability: Secondary | ICD-10-CM | POA: Diagnosis not present

## 2021-10-17 DIAGNOSIS — G8103 Flaccid hemiplegia affecting right nondominant side: Secondary | ICD-10-CM | POA: Diagnosis not present

## 2021-10-17 DIAGNOSIS — I69320 Aphasia following cerebral infarction: Secondary | ICD-10-CM | POA: Diagnosis not present

## 2021-10-17 DIAGNOSIS — D649 Anemia, unspecified: Secondary | ICD-10-CM | POA: Diagnosis not present

## 2021-10-17 DIAGNOSIS — I69891 Dysphagia following other cerebrovascular disease: Secondary | ICD-10-CM | POA: Diagnosis not present

## 2021-10-18 DIAGNOSIS — G8103 Flaccid hemiplegia affecting right nondominant side: Secondary | ICD-10-CM | POA: Diagnosis not present

## 2021-10-18 DIAGNOSIS — Z736 Limitation of activities due to disability: Secondary | ICD-10-CM | POA: Diagnosis not present

## 2021-10-18 DIAGNOSIS — I69320 Aphasia following cerebral infarction: Secondary | ICD-10-CM | POA: Diagnosis not present

## 2021-10-18 DIAGNOSIS — G20 Parkinson's disease: Secondary | ICD-10-CM | POA: Diagnosis not present

## 2021-10-18 DIAGNOSIS — I63322 Cerebral infarction due to thrombosis of left anterior cerebral artery: Secondary | ICD-10-CM | POA: Diagnosis not present

## 2021-10-18 DIAGNOSIS — I69891 Dysphagia following other cerebrovascular disease: Secondary | ICD-10-CM | POA: Diagnosis not present

## 2021-10-19 DIAGNOSIS — I69891 Dysphagia following other cerebrovascular disease: Secondary | ICD-10-CM | POA: Diagnosis not present

## 2021-10-19 DIAGNOSIS — I63322 Cerebral infarction due to thrombosis of left anterior cerebral artery: Secondary | ICD-10-CM | POA: Diagnosis not present

## 2021-10-19 DIAGNOSIS — G20 Parkinson's disease: Secondary | ICD-10-CM | POA: Diagnosis not present

## 2021-10-19 DIAGNOSIS — I69320 Aphasia following cerebral infarction: Secondary | ICD-10-CM | POA: Diagnosis not present

## 2021-10-19 DIAGNOSIS — G8103 Flaccid hemiplegia affecting right nondominant side: Secondary | ICD-10-CM | POA: Diagnosis not present

## 2021-10-19 DIAGNOSIS — Z736 Limitation of activities due to disability: Secondary | ICD-10-CM | POA: Diagnosis not present

## 2021-10-21 DIAGNOSIS — G20 Parkinson's disease: Secondary | ICD-10-CM | POA: Diagnosis not present

## 2021-10-21 DIAGNOSIS — I69891 Dysphagia following other cerebrovascular disease: Secondary | ICD-10-CM | POA: Diagnosis not present

## 2021-10-21 DIAGNOSIS — Z736 Limitation of activities due to disability: Secondary | ICD-10-CM | POA: Diagnosis not present

## 2021-10-21 DIAGNOSIS — I69320 Aphasia following cerebral infarction: Secondary | ICD-10-CM | POA: Diagnosis not present

## 2021-10-21 DIAGNOSIS — I63322 Cerebral infarction due to thrombosis of left anterior cerebral artery: Secondary | ICD-10-CM | POA: Diagnosis not present

## 2021-10-21 DIAGNOSIS — G8103 Flaccid hemiplegia affecting right nondominant side: Secondary | ICD-10-CM | POA: Diagnosis not present

## 2021-10-22 DIAGNOSIS — Z736 Limitation of activities due to disability: Secondary | ICD-10-CM | POA: Diagnosis not present

## 2021-10-22 DIAGNOSIS — I69320 Aphasia following cerebral infarction: Secondary | ICD-10-CM | POA: Diagnosis not present

## 2021-10-22 DIAGNOSIS — G8103 Flaccid hemiplegia affecting right nondominant side: Secondary | ICD-10-CM | POA: Diagnosis not present

## 2021-10-22 DIAGNOSIS — I69891 Dysphagia following other cerebrovascular disease: Secondary | ICD-10-CM | POA: Diagnosis not present

## 2021-10-22 DIAGNOSIS — G20 Parkinson's disease: Secondary | ICD-10-CM | POA: Diagnosis not present

## 2021-10-22 DIAGNOSIS — I63322 Cerebral infarction due to thrombosis of left anterior cerebral artery: Secondary | ICD-10-CM | POA: Diagnosis not present

## 2021-10-28 DIAGNOSIS — M2041 Other hammer toe(s) (acquired), right foot: Secondary | ICD-10-CM | POA: Diagnosis not present

## 2021-10-28 DIAGNOSIS — M2042 Other hammer toe(s) (acquired), left foot: Secondary | ICD-10-CM | POA: Diagnosis not present

## 2021-10-28 DIAGNOSIS — I739 Peripheral vascular disease, unspecified: Secondary | ICD-10-CM | POA: Diagnosis not present

## 2021-10-29 DIAGNOSIS — I10 Essential (primary) hypertension: Secondary | ICD-10-CM | POA: Diagnosis not present

## 2021-10-29 DIAGNOSIS — E559 Vitamin D deficiency, unspecified: Secondary | ICD-10-CM | POA: Diagnosis not present

## 2021-10-29 DIAGNOSIS — E785 Hyperlipidemia, unspecified: Secondary | ICD-10-CM | POA: Diagnosis not present

## 2021-10-29 DIAGNOSIS — D509 Iron deficiency anemia, unspecified: Secondary | ICD-10-CM | POA: Diagnosis not present

## 2021-10-30 DIAGNOSIS — I63322 Cerebral infarction due to thrombosis of left anterior cerebral artery: Secondary | ICD-10-CM | POA: Diagnosis not present

## 2021-10-30 DIAGNOSIS — I5021 Acute systolic (congestive) heart failure: Secondary | ICD-10-CM | POA: Diagnosis not present

## 2021-10-30 DIAGNOSIS — Z79899 Other long term (current) drug therapy: Secondary | ICD-10-CM | POA: Diagnosis not present

## 2021-11-04 DIAGNOSIS — I10 Essential (primary) hypertension: Secondary | ICD-10-CM | POA: Diagnosis not present

## 2021-11-06 DIAGNOSIS — G20 Parkinson's disease: Secondary | ICD-10-CM | POA: Diagnosis not present

## 2021-11-06 DIAGNOSIS — I69891 Dysphagia following other cerebrovascular disease: Secondary | ICD-10-CM | POA: Diagnosis not present

## 2021-11-06 DIAGNOSIS — Z736 Limitation of activities due to disability: Secondary | ICD-10-CM | POA: Diagnosis not present

## 2021-11-06 DIAGNOSIS — G8103 Flaccid hemiplegia affecting right nondominant side: Secondary | ICD-10-CM | POA: Diagnosis not present

## 2021-11-06 DIAGNOSIS — I63322 Cerebral infarction due to thrombosis of left anterior cerebral artery: Secondary | ICD-10-CM | POA: Diagnosis not present

## 2021-11-06 DIAGNOSIS — I69320 Aphasia following cerebral infarction: Secondary | ICD-10-CM | POA: Diagnosis not present

## 2021-11-07 DIAGNOSIS — G8103 Flaccid hemiplegia affecting right nondominant side: Secondary | ICD-10-CM | POA: Diagnosis not present

## 2021-11-07 DIAGNOSIS — G20 Parkinson's disease: Secondary | ICD-10-CM | POA: Diagnosis not present

## 2021-11-07 DIAGNOSIS — I69320 Aphasia following cerebral infarction: Secondary | ICD-10-CM | POA: Diagnosis not present

## 2021-11-07 DIAGNOSIS — I69891 Dysphagia following other cerebrovascular disease: Secondary | ICD-10-CM | POA: Diagnosis not present

## 2021-11-07 DIAGNOSIS — K5900 Constipation, unspecified: Secondary | ICD-10-CM | POA: Diagnosis not present

## 2021-11-07 DIAGNOSIS — Z736 Limitation of activities due to disability: Secondary | ICD-10-CM | POA: Diagnosis not present

## 2021-11-07 DIAGNOSIS — I10 Essential (primary) hypertension: Secondary | ICD-10-CM | POA: Diagnosis not present

## 2021-11-07 DIAGNOSIS — I63322 Cerebral infarction due to thrombosis of left anterior cerebral artery: Secondary | ICD-10-CM | POA: Diagnosis not present

## 2021-11-07 DIAGNOSIS — E559 Vitamin D deficiency, unspecified: Secondary | ICD-10-CM | POA: Diagnosis not present

## 2021-11-08 DIAGNOSIS — I69891 Dysphagia following other cerebrovascular disease: Secondary | ICD-10-CM | POA: Diagnosis not present

## 2021-11-08 DIAGNOSIS — I69320 Aphasia following cerebral infarction: Secondary | ICD-10-CM | POA: Diagnosis not present

## 2021-11-08 DIAGNOSIS — I63322 Cerebral infarction due to thrombosis of left anterior cerebral artery: Secondary | ICD-10-CM | POA: Diagnosis not present

## 2021-11-08 DIAGNOSIS — Z736 Limitation of activities due to disability: Secondary | ICD-10-CM | POA: Diagnosis not present

## 2021-11-08 DIAGNOSIS — G20 Parkinson's disease: Secondary | ICD-10-CM | POA: Diagnosis not present

## 2021-11-08 DIAGNOSIS — G8103 Flaccid hemiplegia affecting right nondominant side: Secondary | ICD-10-CM | POA: Diagnosis not present

## 2021-11-09 DIAGNOSIS — I10 Essential (primary) hypertension: Secondary | ICD-10-CM | POA: Diagnosis not present

## 2021-11-11 DIAGNOSIS — R627 Adult failure to thrive: Secondary | ICD-10-CM | POA: Diagnosis not present

## 2021-11-11 DIAGNOSIS — G20C Parkinsonism, unspecified: Secondary | ICD-10-CM | POA: Diagnosis not present

## 2021-11-11 DIAGNOSIS — G8103 Flaccid hemiplegia affecting right nondominant side: Secondary | ICD-10-CM | POA: Diagnosis not present

## 2021-11-11 DIAGNOSIS — I69320 Aphasia following cerebral infarction: Secondary | ICD-10-CM | POA: Diagnosis not present

## 2021-11-11 DIAGNOSIS — N390 Urinary tract infection, site not specified: Secondary | ICD-10-CM | POA: Diagnosis not present

## 2021-11-11 DIAGNOSIS — E538 Deficiency of other specified B group vitamins: Secondary | ICD-10-CM | POA: Diagnosis not present

## 2021-11-11 DIAGNOSIS — I63322 Cerebral infarction due to thrombosis of left anterior cerebral artery: Secondary | ICD-10-CM | POA: Diagnosis not present

## 2021-11-11 DIAGNOSIS — Z23 Encounter for immunization: Secondary | ICD-10-CM | POA: Diagnosis not present

## 2021-11-11 DIAGNOSIS — M8580 Other specified disorders of bone density and structure, unspecified site: Secondary | ICD-10-CM | POA: Diagnosis not present

## 2021-11-11 DIAGNOSIS — Z736 Limitation of activities due to disability: Secondary | ICD-10-CM | POA: Diagnosis not present

## 2021-11-11 DIAGNOSIS — E785 Hyperlipidemia, unspecified: Secondary | ICD-10-CM | POA: Diagnosis not present

## 2021-11-11 DIAGNOSIS — I69891 Dysphagia following other cerebrovascular disease: Secondary | ICD-10-CM | POA: Diagnosis not present

## 2021-11-11 DIAGNOSIS — R4701 Aphasia: Secondary | ICD-10-CM | POA: Diagnosis not present

## 2021-11-11 DIAGNOSIS — E559 Vitamin D deficiency, unspecified: Secondary | ICD-10-CM | POA: Diagnosis not present

## 2021-11-11 DIAGNOSIS — R1319 Other dysphagia: Secondary | ICD-10-CM | POA: Diagnosis not present

## 2021-11-11 DIAGNOSIS — K219 Gastro-esophageal reflux disease without esophagitis: Secondary | ICD-10-CM | POA: Diagnosis not present

## 2021-11-11 DIAGNOSIS — R41841 Cognitive communication deficit: Secondary | ICD-10-CM | POA: Diagnosis not present

## 2021-11-11 DIAGNOSIS — Z8616 Personal history of COVID-19: Secondary | ICD-10-CM | POA: Diagnosis not present

## 2021-11-11 DIAGNOSIS — I10 Essential (primary) hypertension: Secondary | ICD-10-CM | POA: Diagnosis not present

## 2021-11-11 DIAGNOSIS — N183 Chronic kidney disease, stage 3 unspecified: Secondary | ICD-10-CM | POA: Diagnosis not present

## 2021-11-11 DIAGNOSIS — Z7409 Other reduced mobility: Secondary | ICD-10-CM | POA: Diagnosis not present

## 2021-11-11 DIAGNOSIS — M6259 Muscle wasting and atrophy, not elsewhere classified, multiple sites: Secondary | ICD-10-CM | POA: Diagnosis not present

## 2021-11-11 DIAGNOSIS — M170 Bilateral primary osteoarthritis of knee: Secondary | ICD-10-CM | POA: Diagnosis not present

## 2021-11-12 DIAGNOSIS — Z736 Limitation of activities due to disability: Secondary | ICD-10-CM | POA: Diagnosis not present

## 2021-11-12 DIAGNOSIS — G20C Parkinsonism, unspecified: Secondary | ICD-10-CM | POA: Diagnosis not present

## 2021-11-12 DIAGNOSIS — I63322 Cerebral infarction due to thrombosis of left anterior cerebral artery: Secondary | ICD-10-CM | POA: Diagnosis not present

## 2021-11-12 DIAGNOSIS — I69320 Aphasia following cerebral infarction: Secondary | ICD-10-CM | POA: Diagnosis not present

## 2021-11-12 DIAGNOSIS — G8103 Flaccid hemiplegia affecting right nondominant side: Secondary | ICD-10-CM | POA: Diagnosis not present

## 2021-11-12 DIAGNOSIS — I69891 Dysphagia following other cerebrovascular disease: Secondary | ICD-10-CM | POA: Diagnosis not present

## 2021-11-12 DIAGNOSIS — E860 Dehydration: Secondary | ICD-10-CM | POA: Diagnosis not present

## 2021-11-13 DIAGNOSIS — I63322 Cerebral infarction due to thrombosis of left anterior cerebral artery: Secondary | ICD-10-CM | POA: Diagnosis not present

## 2021-11-13 DIAGNOSIS — G20C Parkinsonism, unspecified: Secondary | ICD-10-CM | POA: Diagnosis not present

## 2021-11-13 DIAGNOSIS — Z736 Limitation of activities due to disability: Secondary | ICD-10-CM | POA: Diagnosis not present

## 2021-11-13 DIAGNOSIS — G8103 Flaccid hemiplegia affecting right nondominant side: Secondary | ICD-10-CM | POA: Diagnosis not present

## 2021-11-13 DIAGNOSIS — I69891 Dysphagia following other cerebrovascular disease: Secondary | ICD-10-CM | POA: Diagnosis not present

## 2021-11-13 DIAGNOSIS — I69320 Aphasia following cerebral infarction: Secondary | ICD-10-CM | POA: Diagnosis not present

## 2021-11-14 DIAGNOSIS — Z736 Limitation of activities due to disability: Secondary | ICD-10-CM | POA: Diagnosis not present

## 2021-11-14 DIAGNOSIS — G8103 Flaccid hemiplegia affecting right nondominant side: Secondary | ICD-10-CM | POA: Diagnosis not present

## 2021-11-14 DIAGNOSIS — I63322 Cerebral infarction due to thrombosis of left anterior cerebral artery: Secondary | ICD-10-CM | POA: Diagnosis not present

## 2021-11-14 DIAGNOSIS — G20C Parkinsonism, unspecified: Secondary | ICD-10-CM | POA: Diagnosis not present

## 2021-11-14 DIAGNOSIS — I69891 Dysphagia following other cerebrovascular disease: Secondary | ICD-10-CM | POA: Diagnosis not present

## 2021-11-14 DIAGNOSIS — I69320 Aphasia following cerebral infarction: Secondary | ICD-10-CM | POA: Diagnosis not present

## 2021-11-15 DIAGNOSIS — I63322 Cerebral infarction due to thrombosis of left anterior cerebral artery: Secondary | ICD-10-CM | POA: Diagnosis not present

## 2021-11-15 DIAGNOSIS — I69891 Dysphagia following other cerebrovascular disease: Secondary | ICD-10-CM | POA: Diagnosis not present

## 2021-11-15 DIAGNOSIS — G8103 Flaccid hemiplegia affecting right nondominant side: Secondary | ICD-10-CM | POA: Diagnosis not present

## 2021-11-15 DIAGNOSIS — I69320 Aphasia following cerebral infarction: Secondary | ICD-10-CM | POA: Diagnosis not present

## 2021-11-15 DIAGNOSIS — G20C Parkinsonism, unspecified: Secondary | ICD-10-CM | POA: Diagnosis not present

## 2021-11-15 DIAGNOSIS — Z736 Limitation of activities due to disability: Secondary | ICD-10-CM | POA: Diagnosis not present

## 2021-11-18 DIAGNOSIS — G8103 Flaccid hemiplegia affecting right nondominant side: Secondary | ICD-10-CM | POA: Diagnosis not present

## 2021-11-18 DIAGNOSIS — I63322 Cerebral infarction due to thrombosis of left anterior cerebral artery: Secondary | ICD-10-CM | POA: Diagnosis not present

## 2021-11-18 DIAGNOSIS — I69320 Aphasia following cerebral infarction: Secondary | ICD-10-CM | POA: Diagnosis not present

## 2021-11-18 DIAGNOSIS — Z736 Limitation of activities due to disability: Secondary | ICD-10-CM | POA: Diagnosis not present

## 2021-11-18 DIAGNOSIS — I69891 Dysphagia following other cerebrovascular disease: Secondary | ICD-10-CM | POA: Diagnosis not present

## 2021-11-18 DIAGNOSIS — G20C Parkinsonism, unspecified: Secondary | ICD-10-CM | POA: Diagnosis not present

## 2021-11-19 DIAGNOSIS — Z736 Limitation of activities due to disability: Secondary | ICD-10-CM | POA: Diagnosis not present

## 2021-11-19 DIAGNOSIS — G20C Parkinsonism, unspecified: Secondary | ICD-10-CM | POA: Diagnosis not present

## 2021-11-19 DIAGNOSIS — I10 Essential (primary) hypertension: Secondary | ICD-10-CM | POA: Diagnosis not present

## 2021-11-19 DIAGNOSIS — I69891 Dysphagia following other cerebrovascular disease: Secondary | ICD-10-CM | POA: Diagnosis not present

## 2021-11-19 DIAGNOSIS — I69320 Aphasia following cerebral infarction: Secondary | ICD-10-CM | POA: Diagnosis not present

## 2021-11-19 DIAGNOSIS — I63322 Cerebral infarction due to thrombosis of left anterior cerebral artery: Secondary | ICD-10-CM | POA: Diagnosis not present

## 2021-11-19 DIAGNOSIS — G8103 Flaccid hemiplegia affecting right nondominant side: Secondary | ICD-10-CM | POA: Diagnosis not present

## 2021-11-20 DIAGNOSIS — Z736 Limitation of activities due to disability: Secondary | ICD-10-CM | POA: Diagnosis not present

## 2021-11-20 DIAGNOSIS — G20C Parkinsonism, unspecified: Secondary | ICD-10-CM | POA: Diagnosis not present

## 2021-11-20 DIAGNOSIS — I69320 Aphasia following cerebral infarction: Secondary | ICD-10-CM | POA: Diagnosis not present

## 2021-11-20 DIAGNOSIS — G8103 Flaccid hemiplegia affecting right nondominant side: Secondary | ICD-10-CM | POA: Diagnosis not present

## 2021-11-20 DIAGNOSIS — I69891 Dysphagia following other cerebrovascular disease: Secondary | ICD-10-CM | POA: Diagnosis not present

## 2021-11-20 DIAGNOSIS — I63322 Cerebral infarction due to thrombosis of left anterior cerebral artery: Secondary | ICD-10-CM | POA: Diagnosis not present

## 2021-11-21 DIAGNOSIS — I69320 Aphasia following cerebral infarction: Secondary | ICD-10-CM | POA: Diagnosis not present

## 2021-11-21 DIAGNOSIS — I69891 Dysphagia following other cerebrovascular disease: Secondary | ICD-10-CM | POA: Diagnosis not present

## 2021-11-21 DIAGNOSIS — Z736 Limitation of activities due to disability: Secondary | ICD-10-CM | POA: Diagnosis not present

## 2021-11-21 DIAGNOSIS — G20C Parkinsonism, unspecified: Secondary | ICD-10-CM | POA: Diagnosis not present

## 2021-11-21 DIAGNOSIS — I63322 Cerebral infarction due to thrombosis of left anterior cerebral artery: Secondary | ICD-10-CM | POA: Diagnosis not present

## 2021-11-21 DIAGNOSIS — G8103 Flaccid hemiplegia affecting right nondominant side: Secondary | ICD-10-CM | POA: Diagnosis not present

## 2021-11-22 DIAGNOSIS — I63322 Cerebral infarction due to thrombosis of left anterior cerebral artery: Secondary | ICD-10-CM | POA: Diagnosis not present

## 2021-11-22 DIAGNOSIS — I69891 Dysphagia following other cerebrovascular disease: Secondary | ICD-10-CM | POA: Diagnosis not present

## 2021-11-22 DIAGNOSIS — G8103 Flaccid hemiplegia affecting right nondominant side: Secondary | ICD-10-CM | POA: Diagnosis not present

## 2021-11-22 DIAGNOSIS — G20C Parkinsonism, unspecified: Secondary | ICD-10-CM | POA: Diagnosis not present

## 2021-11-22 DIAGNOSIS — I69320 Aphasia following cerebral infarction: Secondary | ICD-10-CM | POA: Diagnosis not present

## 2021-11-22 DIAGNOSIS — Z736 Limitation of activities due to disability: Secondary | ICD-10-CM | POA: Diagnosis not present

## 2021-11-25 DIAGNOSIS — Z736 Limitation of activities due to disability: Secondary | ICD-10-CM | POA: Diagnosis not present

## 2021-11-25 DIAGNOSIS — G20C Parkinsonism, unspecified: Secondary | ICD-10-CM | POA: Diagnosis not present

## 2021-11-25 DIAGNOSIS — I69891 Dysphagia following other cerebrovascular disease: Secondary | ICD-10-CM | POA: Diagnosis not present

## 2021-11-25 DIAGNOSIS — I69320 Aphasia following cerebral infarction: Secondary | ICD-10-CM | POA: Diagnosis not present

## 2021-11-25 DIAGNOSIS — I63322 Cerebral infarction due to thrombosis of left anterior cerebral artery: Secondary | ICD-10-CM | POA: Diagnosis not present

## 2021-11-25 DIAGNOSIS — G8103 Flaccid hemiplegia affecting right nondominant side: Secondary | ICD-10-CM | POA: Diagnosis not present

## 2021-11-26 DIAGNOSIS — I69320 Aphasia following cerebral infarction: Secondary | ICD-10-CM | POA: Diagnosis not present

## 2021-11-26 DIAGNOSIS — Z736 Limitation of activities due to disability: Secondary | ICD-10-CM | POA: Diagnosis not present

## 2021-11-26 DIAGNOSIS — G8103 Flaccid hemiplegia affecting right nondominant side: Secondary | ICD-10-CM | POA: Diagnosis not present

## 2021-11-26 DIAGNOSIS — I63322 Cerebral infarction due to thrombosis of left anterior cerebral artery: Secondary | ICD-10-CM | POA: Diagnosis not present

## 2021-11-26 DIAGNOSIS — G20C Parkinsonism, unspecified: Secondary | ICD-10-CM | POA: Diagnosis not present

## 2021-11-26 DIAGNOSIS — I69891 Dysphagia following other cerebrovascular disease: Secondary | ICD-10-CM | POA: Diagnosis not present

## 2021-11-27 DIAGNOSIS — Z736 Limitation of activities due to disability: Secondary | ICD-10-CM | POA: Diagnosis not present

## 2021-11-27 DIAGNOSIS — I69891 Dysphagia following other cerebrovascular disease: Secondary | ICD-10-CM | POA: Diagnosis not present

## 2021-11-27 DIAGNOSIS — G20C Parkinsonism, unspecified: Secondary | ICD-10-CM | POA: Diagnosis not present

## 2021-11-27 DIAGNOSIS — I69320 Aphasia following cerebral infarction: Secondary | ICD-10-CM | POA: Diagnosis not present

## 2021-11-27 DIAGNOSIS — G8103 Flaccid hemiplegia affecting right nondominant side: Secondary | ICD-10-CM | POA: Diagnosis not present

## 2021-11-27 DIAGNOSIS — I63322 Cerebral infarction due to thrombosis of left anterior cerebral artery: Secondary | ICD-10-CM | POA: Diagnosis not present

## 2021-11-28 DIAGNOSIS — I69320 Aphasia following cerebral infarction: Secondary | ICD-10-CM | POA: Diagnosis not present

## 2021-11-28 DIAGNOSIS — Z736 Limitation of activities due to disability: Secondary | ICD-10-CM | POA: Diagnosis not present

## 2021-11-28 DIAGNOSIS — G8103 Flaccid hemiplegia affecting right nondominant side: Secondary | ICD-10-CM | POA: Diagnosis not present

## 2021-11-28 DIAGNOSIS — I63322 Cerebral infarction due to thrombosis of left anterior cerebral artery: Secondary | ICD-10-CM | POA: Diagnosis not present

## 2021-11-28 DIAGNOSIS — G20C Parkinsonism, unspecified: Secondary | ICD-10-CM | POA: Diagnosis not present

## 2021-11-28 DIAGNOSIS — I69891 Dysphagia following other cerebrovascular disease: Secondary | ICD-10-CM | POA: Diagnosis not present

## 2021-11-29 DIAGNOSIS — I69320 Aphasia following cerebral infarction: Secondary | ICD-10-CM | POA: Diagnosis not present

## 2021-11-29 DIAGNOSIS — I69891 Dysphagia following other cerebrovascular disease: Secondary | ICD-10-CM | POA: Diagnosis not present

## 2021-11-29 DIAGNOSIS — Z736 Limitation of activities due to disability: Secondary | ICD-10-CM | POA: Diagnosis not present

## 2021-11-29 DIAGNOSIS — G20C Parkinsonism, unspecified: Secondary | ICD-10-CM | POA: Diagnosis not present

## 2021-11-29 DIAGNOSIS — G8103 Flaccid hemiplegia affecting right nondominant side: Secondary | ICD-10-CM | POA: Diagnosis not present

## 2021-11-29 DIAGNOSIS — I63322 Cerebral infarction due to thrombosis of left anterior cerebral artery: Secondary | ICD-10-CM | POA: Diagnosis not present

## 2021-12-01 DIAGNOSIS — M79642 Pain in left hand: Secondary | ICD-10-CM | POA: Diagnosis not present

## 2021-12-02 DIAGNOSIS — I63322 Cerebral infarction due to thrombosis of left anterior cerebral artery: Secondary | ICD-10-CM | POA: Diagnosis not present

## 2021-12-02 DIAGNOSIS — G8103 Flaccid hemiplegia affecting right nondominant side: Secondary | ICD-10-CM | POA: Diagnosis not present

## 2021-12-02 DIAGNOSIS — I69891 Dysphagia following other cerebrovascular disease: Secondary | ICD-10-CM | POA: Diagnosis not present

## 2021-12-02 DIAGNOSIS — Z736 Limitation of activities due to disability: Secondary | ICD-10-CM | POA: Diagnosis not present

## 2021-12-02 DIAGNOSIS — I69320 Aphasia following cerebral infarction: Secondary | ICD-10-CM | POA: Diagnosis not present

## 2021-12-02 DIAGNOSIS — G20C Parkinsonism, unspecified: Secondary | ICD-10-CM | POA: Diagnosis not present

## 2021-12-03 DIAGNOSIS — I69320 Aphasia following cerebral infarction: Secondary | ICD-10-CM | POA: Diagnosis not present

## 2021-12-03 DIAGNOSIS — I63322 Cerebral infarction due to thrombosis of left anterior cerebral artery: Secondary | ICD-10-CM | POA: Diagnosis not present

## 2021-12-03 DIAGNOSIS — I69891 Dysphagia following other cerebrovascular disease: Secondary | ICD-10-CM | POA: Diagnosis not present

## 2021-12-03 DIAGNOSIS — G8103 Flaccid hemiplegia affecting right nondominant side: Secondary | ICD-10-CM | POA: Diagnosis not present

## 2021-12-03 DIAGNOSIS — Z736 Limitation of activities due to disability: Secondary | ICD-10-CM | POA: Diagnosis not present

## 2021-12-03 DIAGNOSIS — G20C Parkinsonism, unspecified: Secondary | ICD-10-CM | POA: Diagnosis not present

## 2021-12-04 DIAGNOSIS — I63322 Cerebral infarction due to thrombosis of left anterior cerebral artery: Secondary | ICD-10-CM | POA: Diagnosis not present

## 2021-12-04 DIAGNOSIS — I69891 Dysphagia following other cerebrovascular disease: Secondary | ICD-10-CM | POA: Diagnosis not present

## 2021-12-04 DIAGNOSIS — Z736 Limitation of activities due to disability: Secondary | ICD-10-CM | POA: Diagnosis not present

## 2021-12-04 DIAGNOSIS — I69320 Aphasia following cerebral infarction: Secondary | ICD-10-CM | POA: Diagnosis not present

## 2021-12-04 DIAGNOSIS — G20C Parkinsonism, unspecified: Secondary | ICD-10-CM | POA: Diagnosis not present

## 2021-12-04 DIAGNOSIS — G8103 Flaccid hemiplegia affecting right nondominant side: Secondary | ICD-10-CM | POA: Diagnosis not present

## 2021-12-05 DIAGNOSIS — G20C Parkinsonism, unspecified: Secondary | ICD-10-CM | POA: Diagnosis not present

## 2021-12-05 DIAGNOSIS — G8103 Flaccid hemiplegia affecting right nondominant side: Secondary | ICD-10-CM | POA: Diagnosis not present

## 2021-12-05 DIAGNOSIS — I63322 Cerebral infarction due to thrombosis of left anterior cerebral artery: Secondary | ICD-10-CM | POA: Diagnosis not present

## 2021-12-05 DIAGNOSIS — Z736 Limitation of activities due to disability: Secondary | ICD-10-CM | POA: Diagnosis not present

## 2021-12-05 DIAGNOSIS — I69891 Dysphagia following other cerebrovascular disease: Secondary | ICD-10-CM | POA: Diagnosis not present

## 2021-12-05 DIAGNOSIS — I69320 Aphasia following cerebral infarction: Secondary | ICD-10-CM | POA: Diagnosis not present

## 2021-12-06 DIAGNOSIS — G8103 Flaccid hemiplegia affecting right nondominant side: Secondary | ICD-10-CM | POA: Diagnosis not present

## 2021-12-06 DIAGNOSIS — I63322 Cerebral infarction due to thrombosis of left anterior cerebral artery: Secondary | ICD-10-CM | POA: Diagnosis not present

## 2021-12-06 DIAGNOSIS — I69891 Dysphagia following other cerebrovascular disease: Secondary | ICD-10-CM | POA: Diagnosis not present

## 2021-12-06 DIAGNOSIS — Z736 Limitation of activities due to disability: Secondary | ICD-10-CM | POA: Diagnosis not present

## 2021-12-06 DIAGNOSIS — G20C Parkinsonism, unspecified: Secondary | ICD-10-CM | POA: Diagnosis not present

## 2021-12-06 DIAGNOSIS — I69320 Aphasia following cerebral infarction: Secondary | ICD-10-CM | POA: Diagnosis not present

## 2021-12-09 DIAGNOSIS — I69891 Dysphagia following other cerebrovascular disease: Secondary | ICD-10-CM | POA: Diagnosis not present

## 2021-12-09 DIAGNOSIS — I69320 Aphasia following cerebral infarction: Secondary | ICD-10-CM | POA: Diagnosis not present

## 2021-12-09 DIAGNOSIS — G20C Parkinsonism, unspecified: Secondary | ICD-10-CM | POA: Diagnosis not present

## 2021-12-09 DIAGNOSIS — Z736 Limitation of activities due to disability: Secondary | ICD-10-CM | POA: Diagnosis not present

## 2021-12-09 DIAGNOSIS — I63322 Cerebral infarction due to thrombosis of left anterior cerebral artery: Secondary | ICD-10-CM | POA: Diagnosis not present

## 2021-12-09 DIAGNOSIS — G8103 Flaccid hemiplegia affecting right nondominant side: Secondary | ICD-10-CM | POA: Diagnosis not present

## 2021-12-10 DIAGNOSIS — Z736 Limitation of activities due to disability: Secondary | ICD-10-CM | POA: Diagnosis not present

## 2021-12-10 DIAGNOSIS — I69320 Aphasia following cerebral infarction: Secondary | ICD-10-CM | POA: Diagnosis not present

## 2021-12-10 DIAGNOSIS — I63322 Cerebral infarction due to thrombosis of left anterior cerebral artery: Secondary | ICD-10-CM | POA: Diagnosis not present

## 2021-12-10 DIAGNOSIS — G8103 Flaccid hemiplegia affecting right nondominant side: Secondary | ICD-10-CM | POA: Diagnosis not present

## 2021-12-10 DIAGNOSIS — I69891 Dysphagia following other cerebrovascular disease: Secondary | ICD-10-CM | POA: Diagnosis not present

## 2021-12-10 DIAGNOSIS — G20C Parkinsonism, unspecified: Secondary | ICD-10-CM | POA: Diagnosis not present

## 2021-12-16 DIAGNOSIS — Z20822 Contact with and (suspected) exposure to covid-19: Secondary | ICD-10-CM | POA: Diagnosis not present

## 2021-12-30 DIAGNOSIS — R627 Adult failure to thrive: Secondary | ICD-10-CM | POA: Diagnosis not present

## 2021-12-30 DIAGNOSIS — E559 Vitamin D deficiency, unspecified: Secondary | ICD-10-CM | POA: Diagnosis not present

## 2021-12-30 DIAGNOSIS — M170 Bilateral primary osteoarthritis of knee: Secondary | ICD-10-CM | POA: Diagnosis not present

## 2021-12-30 DIAGNOSIS — K219 Gastro-esophageal reflux disease without esophagitis: Secondary | ICD-10-CM | POA: Diagnosis not present

## 2021-12-30 DIAGNOSIS — R4701 Aphasia: Secondary | ICD-10-CM | POA: Diagnosis not present

## 2021-12-30 DIAGNOSIS — Z7409 Other reduced mobility: Secondary | ICD-10-CM | POA: Diagnosis not present

## 2021-12-30 DIAGNOSIS — I69891 Dysphagia following other cerebrovascular disease: Secondary | ICD-10-CM | POA: Diagnosis not present

## 2021-12-30 DIAGNOSIS — I69320 Aphasia following cerebral infarction: Secondary | ICD-10-CM | POA: Diagnosis not present

## 2021-12-30 DIAGNOSIS — E538 Deficiency of other specified B group vitamins: Secondary | ICD-10-CM | POA: Diagnosis not present

## 2021-12-30 DIAGNOSIS — I63322 Cerebral infarction due to thrombosis of left anterior cerebral artery: Secondary | ICD-10-CM | POA: Diagnosis not present

## 2021-12-30 DIAGNOSIS — M8580 Other specified disorders of bone density and structure, unspecified site: Secondary | ICD-10-CM | POA: Diagnosis not present

## 2021-12-30 DIAGNOSIS — R1319 Other dysphagia: Secondary | ICD-10-CM | POA: Diagnosis not present

## 2021-12-30 DIAGNOSIS — N183 Chronic kidney disease, stage 3 unspecified: Secondary | ICD-10-CM | POA: Diagnosis not present

## 2021-12-30 DIAGNOSIS — G8103 Flaccid hemiplegia affecting right nondominant side: Secondary | ICD-10-CM | POA: Diagnosis not present

## 2021-12-30 DIAGNOSIS — G20C Parkinsonism, unspecified: Secondary | ICD-10-CM | POA: Diagnosis not present

## 2021-12-30 DIAGNOSIS — R41841 Cognitive communication deficit: Secondary | ICD-10-CM | POA: Diagnosis not present

## 2021-12-30 DIAGNOSIS — Z736 Limitation of activities due to disability: Secondary | ICD-10-CM | POA: Diagnosis not present

## 2021-12-30 DIAGNOSIS — N390 Urinary tract infection, site not specified: Secondary | ICD-10-CM | POA: Diagnosis not present

## 2021-12-30 DIAGNOSIS — M6259 Muscle wasting and atrophy, not elsewhere classified, multiple sites: Secondary | ICD-10-CM | POA: Diagnosis not present

## 2021-12-30 DIAGNOSIS — Z8616 Personal history of COVID-19: Secondary | ICD-10-CM | POA: Diagnosis not present

## 2021-12-30 DIAGNOSIS — I10 Essential (primary) hypertension: Secondary | ICD-10-CM | POA: Diagnosis not present

## 2021-12-30 DIAGNOSIS — E785 Hyperlipidemia, unspecified: Secondary | ICD-10-CM | POA: Diagnosis not present

## 2021-12-31 DIAGNOSIS — I63322 Cerebral infarction due to thrombosis of left anterior cerebral artery: Secondary | ICD-10-CM | POA: Diagnosis not present

## 2021-12-31 DIAGNOSIS — I69891 Dysphagia following other cerebrovascular disease: Secondary | ICD-10-CM | POA: Diagnosis not present

## 2021-12-31 DIAGNOSIS — G8103 Flaccid hemiplegia affecting right nondominant side: Secondary | ICD-10-CM | POA: Diagnosis not present

## 2021-12-31 DIAGNOSIS — Z736 Limitation of activities due to disability: Secondary | ICD-10-CM | POA: Diagnosis not present

## 2021-12-31 DIAGNOSIS — G20C Parkinsonism, unspecified: Secondary | ICD-10-CM | POA: Diagnosis not present

## 2021-12-31 DIAGNOSIS — I69320 Aphasia following cerebral infarction: Secondary | ICD-10-CM | POA: Diagnosis not present

## 2022-01-01 DIAGNOSIS — G8103 Flaccid hemiplegia affecting right nondominant side: Secondary | ICD-10-CM | POA: Diagnosis not present

## 2022-01-01 DIAGNOSIS — G20C Parkinsonism, unspecified: Secondary | ICD-10-CM | POA: Diagnosis not present

## 2022-01-01 DIAGNOSIS — Z736 Limitation of activities due to disability: Secondary | ICD-10-CM | POA: Diagnosis not present

## 2022-01-01 DIAGNOSIS — I69320 Aphasia following cerebral infarction: Secondary | ICD-10-CM | POA: Diagnosis not present

## 2022-01-01 DIAGNOSIS — I69891 Dysphagia following other cerebrovascular disease: Secondary | ICD-10-CM | POA: Diagnosis not present

## 2022-01-01 DIAGNOSIS — I63322 Cerebral infarction due to thrombosis of left anterior cerebral artery: Secondary | ICD-10-CM | POA: Diagnosis not present

## 2022-01-02 DIAGNOSIS — G8103 Flaccid hemiplegia affecting right nondominant side: Secondary | ICD-10-CM | POA: Diagnosis not present

## 2022-01-02 DIAGNOSIS — I69320 Aphasia following cerebral infarction: Secondary | ICD-10-CM | POA: Diagnosis not present

## 2022-01-02 DIAGNOSIS — G20C Parkinsonism, unspecified: Secondary | ICD-10-CM | POA: Diagnosis not present

## 2022-01-02 DIAGNOSIS — Z736 Limitation of activities due to disability: Secondary | ICD-10-CM | POA: Diagnosis not present

## 2022-01-02 DIAGNOSIS — I69891 Dysphagia following other cerebrovascular disease: Secondary | ICD-10-CM | POA: Diagnosis not present

## 2022-01-02 DIAGNOSIS — I63322 Cerebral infarction due to thrombosis of left anterior cerebral artery: Secondary | ICD-10-CM | POA: Diagnosis not present

## 2022-01-03 DIAGNOSIS — G20C Parkinsonism, unspecified: Secondary | ICD-10-CM | POA: Diagnosis not present

## 2022-01-03 DIAGNOSIS — I69320 Aphasia following cerebral infarction: Secondary | ICD-10-CM | POA: Diagnosis not present

## 2022-01-03 DIAGNOSIS — I63322 Cerebral infarction due to thrombosis of left anterior cerebral artery: Secondary | ICD-10-CM | POA: Diagnosis not present

## 2022-01-03 DIAGNOSIS — G8103 Flaccid hemiplegia affecting right nondominant side: Secondary | ICD-10-CM | POA: Diagnosis not present

## 2022-01-03 DIAGNOSIS — I69891 Dysphagia following other cerebrovascular disease: Secondary | ICD-10-CM | POA: Diagnosis not present

## 2022-01-03 DIAGNOSIS — Z736 Limitation of activities due to disability: Secondary | ICD-10-CM | POA: Diagnosis not present

## 2022-01-06 DIAGNOSIS — Z736 Limitation of activities due to disability: Secondary | ICD-10-CM | POA: Diagnosis not present

## 2022-01-06 DIAGNOSIS — I69891 Dysphagia following other cerebrovascular disease: Secondary | ICD-10-CM | POA: Diagnosis not present

## 2022-01-06 DIAGNOSIS — I69320 Aphasia following cerebral infarction: Secondary | ICD-10-CM | POA: Diagnosis not present

## 2022-01-06 DIAGNOSIS — G8103 Flaccid hemiplegia affecting right nondominant side: Secondary | ICD-10-CM | POA: Diagnosis not present

## 2022-01-06 DIAGNOSIS — G20C Parkinsonism, unspecified: Secondary | ICD-10-CM | POA: Diagnosis not present

## 2022-01-06 DIAGNOSIS — I63322 Cerebral infarction due to thrombosis of left anterior cerebral artery: Secondary | ICD-10-CM | POA: Diagnosis not present

## 2022-01-07 DIAGNOSIS — G8103 Flaccid hemiplegia affecting right nondominant side: Secondary | ICD-10-CM | POA: Diagnosis not present

## 2022-01-07 DIAGNOSIS — I69320 Aphasia following cerebral infarction: Secondary | ICD-10-CM | POA: Diagnosis not present

## 2022-01-07 DIAGNOSIS — I63322 Cerebral infarction due to thrombosis of left anterior cerebral artery: Secondary | ICD-10-CM | POA: Diagnosis not present

## 2022-01-07 DIAGNOSIS — G20C Parkinsonism, unspecified: Secondary | ICD-10-CM | POA: Diagnosis not present

## 2022-01-07 DIAGNOSIS — I69891 Dysphagia following other cerebrovascular disease: Secondary | ICD-10-CM | POA: Diagnosis not present

## 2022-01-07 DIAGNOSIS — Z736 Limitation of activities due to disability: Secondary | ICD-10-CM | POA: Diagnosis not present

## 2022-01-08 DIAGNOSIS — G20C Parkinsonism, unspecified: Secondary | ICD-10-CM | POA: Diagnosis not present

## 2022-01-08 DIAGNOSIS — Z736 Limitation of activities due to disability: Secondary | ICD-10-CM | POA: Diagnosis not present

## 2022-01-08 DIAGNOSIS — I63322 Cerebral infarction due to thrombosis of left anterior cerebral artery: Secondary | ICD-10-CM | POA: Diagnosis not present

## 2022-01-08 DIAGNOSIS — I69891 Dysphagia following other cerebrovascular disease: Secondary | ICD-10-CM | POA: Diagnosis not present

## 2022-01-08 DIAGNOSIS — G8103 Flaccid hemiplegia affecting right nondominant side: Secondary | ICD-10-CM | POA: Diagnosis not present

## 2022-01-08 DIAGNOSIS — I69320 Aphasia following cerebral infarction: Secondary | ICD-10-CM | POA: Diagnosis not present

## 2022-01-09 DIAGNOSIS — Z736 Limitation of activities due to disability: Secondary | ICD-10-CM | POA: Diagnosis not present

## 2022-01-09 DIAGNOSIS — G20C Parkinsonism, unspecified: Secondary | ICD-10-CM | POA: Diagnosis not present

## 2022-01-09 DIAGNOSIS — I63322 Cerebral infarction due to thrombosis of left anterior cerebral artery: Secondary | ICD-10-CM | POA: Diagnosis not present

## 2022-01-09 DIAGNOSIS — I69891 Dysphagia following other cerebrovascular disease: Secondary | ICD-10-CM | POA: Diagnosis not present

## 2022-01-09 DIAGNOSIS — B351 Tinea unguium: Secondary | ICD-10-CM | POA: Diagnosis not present

## 2022-01-09 DIAGNOSIS — G8103 Flaccid hemiplegia affecting right nondominant side: Secondary | ICD-10-CM | POA: Diagnosis not present

## 2022-01-09 DIAGNOSIS — I739 Peripheral vascular disease, unspecified: Secondary | ICD-10-CM | POA: Diagnosis not present

## 2022-01-09 DIAGNOSIS — L603 Nail dystrophy: Secondary | ICD-10-CM | POA: Diagnosis not present

## 2022-01-09 DIAGNOSIS — I69320 Aphasia following cerebral infarction: Secondary | ICD-10-CM | POA: Diagnosis not present

## 2022-01-10 DIAGNOSIS — M8580 Other specified disorders of bone density and structure, unspecified site: Secondary | ICD-10-CM | POA: Diagnosis not present

## 2022-01-10 DIAGNOSIS — Z8616 Personal history of COVID-19: Secondary | ICD-10-CM | POA: Diagnosis not present

## 2022-01-10 DIAGNOSIS — M170 Bilateral primary osteoarthritis of knee: Secondary | ICD-10-CM | POA: Diagnosis not present

## 2022-01-10 DIAGNOSIS — R627 Adult failure to thrive: Secondary | ICD-10-CM | POA: Diagnosis not present

## 2022-01-10 DIAGNOSIS — N183 Chronic kidney disease, stage 3 unspecified: Secondary | ICD-10-CM | POA: Diagnosis not present

## 2022-01-10 DIAGNOSIS — N390 Urinary tract infection, site not specified: Secondary | ICD-10-CM | POA: Diagnosis not present

## 2022-01-10 DIAGNOSIS — G20C Parkinsonism, unspecified: Secondary | ICD-10-CM | POA: Diagnosis not present

## 2022-01-10 DIAGNOSIS — Z736 Limitation of activities due to disability: Secondary | ICD-10-CM | POA: Diagnosis not present

## 2022-01-10 DIAGNOSIS — E785 Hyperlipidemia, unspecified: Secondary | ICD-10-CM | POA: Diagnosis not present

## 2022-01-10 DIAGNOSIS — M6259 Muscle wasting and atrophy, not elsewhere classified, multiple sites: Secondary | ICD-10-CM | POA: Diagnosis not present

## 2022-01-10 DIAGNOSIS — E538 Deficiency of other specified B group vitamins: Secondary | ICD-10-CM | POA: Diagnosis not present

## 2022-01-10 DIAGNOSIS — M24521 Contracture, right elbow: Secondary | ICD-10-CM | POA: Diagnosis not present

## 2022-01-10 DIAGNOSIS — I63322 Cerebral infarction due to thrombosis of left anterior cerebral artery: Secondary | ICD-10-CM | POA: Diagnosis not present

## 2022-01-10 DIAGNOSIS — R4701 Aphasia: Secondary | ICD-10-CM | POA: Diagnosis not present

## 2022-01-10 DIAGNOSIS — R41841 Cognitive communication deficit: Secondary | ICD-10-CM | POA: Diagnosis not present

## 2022-01-10 DIAGNOSIS — I69320 Aphasia following cerebral infarction: Secondary | ICD-10-CM | POA: Diagnosis not present

## 2022-01-10 DIAGNOSIS — Z7409 Other reduced mobility: Secondary | ICD-10-CM | POA: Diagnosis not present

## 2022-01-10 DIAGNOSIS — R1319 Other dysphagia: Secondary | ICD-10-CM | POA: Diagnosis not present

## 2022-01-10 DIAGNOSIS — I69891 Dysphagia following other cerebrovascular disease: Secondary | ICD-10-CM | POA: Diagnosis not present

## 2022-01-10 DIAGNOSIS — I10 Essential (primary) hypertension: Secondary | ICD-10-CM | POA: Diagnosis not present

## 2022-01-10 DIAGNOSIS — E559 Vitamin D deficiency, unspecified: Secondary | ICD-10-CM | POA: Diagnosis not present

## 2022-01-10 DIAGNOSIS — G8103 Flaccid hemiplegia affecting right nondominant side: Secondary | ICD-10-CM | POA: Diagnosis not present

## 2022-01-10 DIAGNOSIS — K219 Gastro-esophageal reflux disease without esophagitis: Secondary | ICD-10-CM | POA: Diagnosis not present

## 2022-01-13 DIAGNOSIS — I63322 Cerebral infarction due to thrombosis of left anterior cerebral artery: Secondary | ICD-10-CM | POA: Diagnosis not present

## 2022-01-13 DIAGNOSIS — M24521 Contracture, right elbow: Secondary | ICD-10-CM | POA: Diagnosis not present

## 2022-01-13 DIAGNOSIS — G20C Parkinsonism, unspecified: Secondary | ICD-10-CM | POA: Diagnosis not present

## 2022-01-13 DIAGNOSIS — I69891 Dysphagia following other cerebrovascular disease: Secondary | ICD-10-CM | POA: Diagnosis not present

## 2022-01-13 DIAGNOSIS — I69320 Aphasia following cerebral infarction: Secondary | ICD-10-CM | POA: Diagnosis not present

## 2022-01-13 DIAGNOSIS — G8103 Flaccid hemiplegia affecting right nondominant side: Secondary | ICD-10-CM | POA: Diagnosis not present

## 2022-01-14 DIAGNOSIS — M24521 Contracture, right elbow: Secondary | ICD-10-CM | POA: Diagnosis not present

## 2022-01-14 DIAGNOSIS — I69320 Aphasia following cerebral infarction: Secondary | ICD-10-CM | POA: Diagnosis not present

## 2022-01-14 DIAGNOSIS — I63322 Cerebral infarction due to thrombosis of left anterior cerebral artery: Secondary | ICD-10-CM | POA: Diagnosis not present

## 2022-01-14 DIAGNOSIS — G20C Parkinsonism, unspecified: Secondary | ICD-10-CM | POA: Diagnosis not present

## 2022-01-14 DIAGNOSIS — G8103 Flaccid hemiplegia affecting right nondominant side: Secondary | ICD-10-CM | POA: Diagnosis not present

## 2022-01-14 DIAGNOSIS — I69891 Dysphagia following other cerebrovascular disease: Secondary | ICD-10-CM | POA: Diagnosis not present

## 2022-01-15 DIAGNOSIS — I69320 Aphasia following cerebral infarction: Secondary | ICD-10-CM | POA: Diagnosis not present

## 2022-01-15 DIAGNOSIS — I69951 Hemiplegia and hemiparesis following unspecified cerebrovascular disease affecting right dominant side: Secondary | ICD-10-CM | POA: Diagnosis not present

## 2022-01-15 DIAGNOSIS — D509 Iron deficiency anemia, unspecified: Secondary | ICD-10-CM | POA: Diagnosis not present

## 2022-01-15 DIAGNOSIS — G8103 Flaccid hemiplegia affecting right nondominant side: Secondary | ICD-10-CM | POA: Diagnosis not present

## 2022-01-15 DIAGNOSIS — G20C Parkinsonism, unspecified: Secondary | ICD-10-CM | POA: Diagnosis not present

## 2022-01-15 DIAGNOSIS — I10 Essential (primary) hypertension: Secondary | ICD-10-CM | POA: Diagnosis not present

## 2022-01-15 DIAGNOSIS — E559 Vitamin D deficiency, unspecified: Secondary | ICD-10-CM | POA: Diagnosis not present

## 2022-01-15 DIAGNOSIS — M24521 Contracture, right elbow: Secondary | ICD-10-CM | POA: Diagnosis not present

## 2022-01-15 DIAGNOSIS — I63322 Cerebral infarction due to thrombosis of left anterior cerebral artery: Secondary | ICD-10-CM | POA: Diagnosis not present

## 2022-01-15 DIAGNOSIS — I69891 Dysphagia following other cerebrovascular disease: Secondary | ICD-10-CM | POA: Diagnosis not present

## 2022-01-16 DIAGNOSIS — I63322 Cerebral infarction due to thrombosis of left anterior cerebral artery: Secondary | ICD-10-CM | POA: Diagnosis not present

## 2022-01-16 DIAGNOSIS — I69891 Dysphagia following other cerebrovascular disease: Secondary | ICD-10-CM | POA: Diagnosis not present

## 2022-01-16 DIAGNOSIS — G20C Parkinsonism, unspecified: Secondary | ICD-10-CM | POA: Diagnosis not present

## 2022-01-16 DIAGNOSIS — G8103 Flaccid hemiplegia affecting right nondominant side: Secondary | ICD-10-CM | POA: Diagnosis not present

## 2022-01-16 DIAGNOSIS — M24521 Contracture, right elbow: Secondary | ICD-10-CM | POA: Diagnosis not present

## 2022-01-16 DIAGNOSIS — I69320 Aphasia following cerebral infarction: Secondary | ICD-10-CM | POA: Diagnosis not present

## 2022-01-20 DIAGNOSIS — I10 Essential (primary) hypertension: Secondary | ICD-10-CM | POA: Diagnosis not present

## 2022-01-20 DIAGNOSIS — E559 Vitamin D deficiency, unspecified: Secondary | ICD-10-CM | POA: Diagnosis not present

## 2022-02-21 DIAGNOSIS — D509 Iron deficiency anemia, unspecified: Secondary | ICD-10-CM | POA: Diagnosis not present

## 2022-02-21 DIAGNOSIS — R627 Adult failure to thrive: Secondary | ICD-10-CM | POA: Diagnosis not present

## 2022-02-26 DIAGNOSIS — M24521 Contracture, right elbow: Secondary | ICD-10-CM | POA: Diagnosis not present

## 2022-02-26 DIAGNOSIS — R1319 Other dysphagia: Secondary | ICD-10-CM | POA: Diagnosis not present

## 2022-02-26 DIAGNOSIS — G20C Parkinsonism, unspecified: Secondary | ICD-10-CM | POA: Diagnosis not present

## 2022-02-26 DIAGNOSIS — R41841 Cognitive communication deficit: Secondary | ICD-10-CM | POA: Diagnosis not present

## 2022-02-26 DIAGNOSIS — E538 Deficiency of other specified B group vitamins: Secondary | ICD-10-CM | POA: Diagnosis not present

## 2022-02-26 DIAGNOSIS — Z736 Limitation of activities due to disability: Secondary | ICD-10-CM | POA: Diagnosis not present

## 2022-02-26 DIAGNOSIS — D509 Iron deficiency anemia, unspecified: Secondary | ICD-10-CM | POA: Diagnosis not present

## 2022-02-26 DIAGNOSIS — G8103 Flaccid hemiplegia affecting right nondominant side: Secondary | ICD-10-CM | POA: Diagnosis not present

## 2022-02-26 DIAGNOSIS — R4701 Aphasia: Secondary | ICD-10-CM | POA: Diagnosis not present

## 2022-02-26 DIAGNOSIS — E785 Hyperlipidemia, unspecified: Secondary | ICD-10-CM | POA: Diagnosis not present

## 2022-02-26 DIAGNOSIS — M170 Bilateral primary osteoarthritis of knee: Secondary | ICD-10-CM | POA: Diagnosis not present

## 2022-02-26 DIAGNOSIS — I63322 Cerebral infarction due to thrombosis of left anterior cerebral artery: Secondary | ICD-10-CM | POA: Diagnosis not present

## 2022-02-26 DIAGNOSIS — K219 Gastro-esophageal reflux disease without esophagitis: Secondary | ICD-10-CM | POA: Diagnosis not present

## 2022-02-26 DIAGNOSIS — R627 Adult failure to thrive: Secondary | ICD-10-CM | POA: Diagnosis not present

## 2022-02-26 DIAGNOSIS — R059 Cough, unspecified: Secondary | ICD-10-CM | POA: Diagnosis not present

## 2022-02-26 DIAGNOSIS — N183 Chronic kidney disease, stage 3 unspecified: Secondary | ICD-10-CM | POA: Diagnosis not present

## 2022-02-26 DIAGNOSIS — N390 Urinary tract infection, site not specified: Secondary | ICD-10-CM | POA: Diagnosis not present

## 2022-02-26 DIAGNOSIS — I69320 Aphasia following cerebral infarction: Secondary | ICD-10-CM | POA: Diagnosis not present

## 2022-02-26 DIAGNOSIS — Z8616 Personal history of COVID-19: Secondary | ICD-10-CM | POA: Diagnosis not present

## 2022-02-26 DIAGNOSIS — M8580 Other specified disorders of bone density and structure, unspecified site: Secondary | ICD-10-CM | POA: Diagnosis not present

## 2022-02-26 DIAGNOSIS — Z20828 Contact with and (suspected) exposure to other viral communicable diseases: Secondary | ICD-10-CM | POA: Diagnosis not present

## 2022-02-26 DIAGNOSIS — M6259 Muscle wasting and atrophy, not elsewhere classified, multiple sites: Secondary | ICD-10-CM | POA: Diagnosis not present

## 2022-02-26 DIAGNOSIS — Z7409 Other reduced mobility: Secondary | ICD-10-CM | POA: Diagnosis not present

## 2022-02-26 DIAGNOSIS — E559 Vitamin D deficiency, unspecified: Secondary | ICD-10-CM | POA: Diagnosis not present

## 2022-02-26 DIAGNOSIS — I69891 Dysphagia following other cerebrovascular disease: Secondary | ICD-10-CM | POA: Diagnosis not present

## 2022-02-26 DIAGNOSIS — I10 Essential (primary) hypertension: Secondary | ICD-10-CM | POA: Diagnosis not present

## 2022-02-27 DIAGNOSIS — I69320 Aphasia following cerebral infarction: Secondary | ICD-10-CM | POA: Diagnosis not present

## 2022-02-27 DIAGNOSIS — I10 Essential (primary) hypertension: Secondary | ICD-10-CM | POA: Diagnosis not present

## 2022-02-27 DIAGNOSIS — G20C Parkinsonism, unspecified: Secondary | ICD-10-CM | POA: Diagnosis not present

## 2022-02-27 DIAGNOSIS — D509 Iron deficiency anemia, unspecified: Secondary | ICD-10-CM | POA: Diagnosis not present

## 2022-02-27 DIAGNOSIS — R059 Cough, unspecified: Secondary | ICD-10-CM | POA: Diagnosis not present

## 2022-02-27 DIAGNOSIS — M24521 Contracture, right elbow: Secondary | ICD-10-CM | POA: Diagnosis not present

## 2022-02-27 DIAGNOSIS — I69891 Dysphagia following other cerebrovascular disease: Secondary | ICD-10-CM | POA: Diagnosis not present

## 2022-02-27 DIAGNOSIS — R627 Adult failure to thrive: Secondary | ICD-10-CM | POA: Diagnosis not present

## 2022-02-27 DIAGNOSIS — G8103 Flaccid hemiplegia affecting right nondominant side: Secondary | ICD-10-CM | POA: Diagnosis not present

## 2022-02-27 DIAGNOSIS — I63322 Cerebral infarction due to thrombosis of left anterior cerebral artery: Secondary | ICD-10-CM | POA: Diagnosis not present

## 2022-02-28 DIAGNOSIS — M24521 Contracture, right elbow: Secondary | ICD-10-CM | POA: Diagnosis not present

## 2022-02-28 DIAGNOSIS — I69891 Dysphagia following other cerebrovascular disease: Secondary | ICD-10-CM | POA: Diagnosis not present

## 2022-02-28 DIAGNOSIS — I63322 Cerebral infarction due to thrombosis of left anterior cerebral artery: Secondary | ICD-10-CM | POA: Diagnosis not present

## 2022-02-28 DIAGNOSIS — G20C Parkinsonism, unspecified: Secondary | ICD-10-CM | POA: Diagnosis not present

## 2022-02-28 DIAGNOSIS — G8103 Flaccid hemiplegia affecting right nondominant side: Secondary | ICD-10-CM | POA: Diagnosis not present

## 2022-02-28 DIAGNOSIS — I69320 Aphasia following cerebral infarction: Secondary | ICD-10-CM | POA: Diagnosis not present

## 2022-02-28 DIAGNOSIS — R059 Cough, unspecified: Secondary | ICD-10-CM | POA: Diagnosis not present

## 2022-03-03 DIAGNOSIS — G20C Parkinsonism, unspecified: Secondary | ICD-10-CM | POA: Diagnosis not present

## 2022-03-03 DIAGNOSIS — I69320 Aphasia following cerebral infarction: Secondary | ICD-10-CM | POA: Diagnosis not present

## 2022-03-03 DIAGNOSIS — I69891 Dysphagia following other cerebrovascular disease: Secondary | ICD-10-CM | POA: Diagnosis not present

## 2022-03-03 DIAGNOSIS — M24521 Contracture, right elbow: Secondary | ICD-10-CM | POA: Diagnosis not present

## 2022-03-03 DIAGNOSIS — G8103 Flaccid hemiplegia affecting right nondominant side: Secondary | ICD-10-CM | POA: Diagnosis not present

## 2022-03-03 DIAGNOSIS — I63322 Cerebral infarction due to thrombosis of left anterior cerebral artery: Secondary | ICD-10-CM | POA: Diagnosis not present

## 2022-03-04 DIAGNOSIS — I69891 Dysphagia following other cerebrovascular disease: Secondary | ICD-10-CM | POA: Diagnosis not present

## 2022-03-04 DIAGNOSIS — G8103 Flaccid hemiplegia affecting right nondominant side: Secondary | ICD-10-CM | POA: Diagnosis not present

## 2022-03-04 DIAGNOSIS — G20C Parkinsonism, unspecified: Secondary | ICD-10-CM | POA: Diagnosis not present

## 2022-03-04 DIAGNOSIS — I63322 Cerebral infarction due to thrombosis of left anterior cerebral artery: Secondary | ICD-10-CM | POA: Diagnosis not present

## 2022-03-04 DIAGNOSIS — M24521 Contracture, right elbow: Secondary | ICD-10-CM | POA: Diagnosis not present

## 2022-03-04 DIAGNOSIS — I69320 Aphasia following cerebral infarction: Secondary | ICD-10-CM | POA: Diagnosis not present

## 2022-03-05 DIAGNOSIS — I69320 Aphasia following cerebral infarction: Secondary | ICD-10-CM | POA: Diagnosis not present

## 2022-03-05 DIAGNOSIS — G20C Parkinsonism, unspecified: Secondary | ICD-10-CM | POA: Diagnosis not present

## 2022-03-05 DIAGNOSIS — I69891 Dysphagia following other cerebrovascular disease: Secondary | ICD-10-CM | POA: Diagnosis not present

## 2022-03-05 DIAGNOSIS — I63322 Cerebral infarction due to thrombosis of left anterior cerebral artery: Secondary | ICD-10-CM | POA: Diagnosis not present

## 2022-03-05 DIAGNOSIS — G8103 Flaccid hemiplegia affecting right nondominant side: Secondary | ICD-10-CM | POA: Diagnosis not present

## 2022-03-05 DIAGNOSIS — M24521 Contracture, right elbow: Secondary | ICD-10-CM | POA: Diagnosis not present

## 2022-03-06 DIAGNOSIS — G20C Parkinsonism, unspecified: Secondary | ICD-10-CM | POA: Diagnosis not present

## 2022-03-06 DIAGNOSIS — I63322 Cerebral infarction due to thrombosis of left anterior cerebral artery: Secondary | ICD-10-CM | POA: Diagnosis not present

## 2022-03-06 DIAGNOSIS — I69891 Dysphagia following other cerebrovascular disease: Secondary | ICD-10-CM | POA: Diagnosis not present

## 2022-03-06 DIAGNOSIS — I69320 Aphasia following cerebral infarction: Secondary | ICD-10-CM | POA: Diagnosis not present

## 2022-03-06 DIAGNOSIS — M24521 Contracture, right elbow: Secondary | ICD-10-CM | POA: Diagnosis not present

## 2022-03-06 DIAGNOSIS — G8103 Flaccid hemiplegia affecting right nondominant side: Secondary | ICD-10-CM | POA: Diagnosis not present

## 2022-03-07 DIAGNOSIS — G8103 Flaccid hemiplegia affecting right nondominant side: Secondary | ICD-10-CM | POA: Diagnosis not present

## 2022-03-07 DIAGNOSIS — M24521 Contracture, right elbow: Secondary | ICD-10-CM | POA: Diagnosis not present

## 2022-03-07 DIAGNOSIS — G20C Parkinsonism, unspecified: Secondary | ICD-10-CM | POA: Diagnosis not present

## 2022-03-07 DIAGNOSIS — I63322 Cerebral infarction due to thrombosis of left anterior cerebral artery: Secondary | ICD-10-CM | POA: Diagnosis not present

## 2022-03-07 DIAGNOSIS — I69891 Dysphagia following other cerebrovascular disease: Secondary | ICD-10-CM | POA: Diagnosis not present

## 2022-03-07 DIAGNOSIS — I69320 Aphasia following cerebral infarction: Secondary | ICD-10-CM | POA: Diagnosis not present

## 2022-03-10 DIAGNOSIS — M24521 Contracture, right elbow: Secondary | ICD-10-CM | POA: Diagnosis not present

## 2022-03-10 DIAGNOSIS — I69320 Aphasia following cerebral infarction: Secondary | ICD-10-CM | POA: Diagnosis not present

## 2022-03-10 DIAGNOSIS — G8103 Flaccid hemiplegia affecting right nondominant side: Secondary | ICD-10-CM | POA: Diagnosis not present

## 2022-03-10 DIAGNOSIS — G20C Parkinsonism, unspecified: Secondary | ICD-10-CM | POA: Diagnosis not present

## 2022-03-10 DIAGNOSIS — I69891 Dysphagia following other cerebrovascular disease: Secondary | ICD-10-CM | POA: Diagnosis not present

## 2022-03-10 DIAGNOSIS — I63322 Cerebral infarction due to thrombosis of left anterior cerebral artery: Secondary | ICD-10-CM | POA: Diagnosis not present

## 2022-03-11 DIAGNOSIS — M24521 Contracture, right elbow: Secondary | ICD-10-CM | POA: Diagnosis not present

## 2022-03-11 DIAGNOSIS — G8103 Flaccid hemiplegia affecting right nondominant side: Secondary | ICD-10-CM | POA: Diagnosis not present

## 2022-03-11 DIAGNOSIS — I69320 Aphasia following cerebral infarction: Secondary | ICD-10-CM | POA: Diagnosis not present

## 2022-03-11 DIAGNOSIS — I69891 Dysphagia following other cerebrovascular disease: Secondary | ICD-10-CM | POA: Diagnosis not present

## 2022-03-11 DIAGNOSIS — I63322 Cerebral infarction due to thrombosis of left anterior cerebral artery: Secondary | ICD-10-CM | POA: Diagnosis not present

## 2022-03-11 DIAGNOSIS — G20C Parkinsonism, unspecified: Secondary | ICD-10-CM | POA: Diagnosis not present

## 2022-03-13 DIAGNOSIS — E538 Deficiency of other specified B group vitamins: Secondary | ICD-10-CM | POA: Diagnosis not present

## 2022-03-13 DIAGNOSIS — R4701 Aphasia: Secondary | ICD-10-CM | POA: Diagnosis not present

## 2022-03-13 DIAGNOSIS — E559 Vitamin D deficiency, unspecified: Secondary | ICD-10-CM | POA: Diagnosis not present

## 2022-03-13 DIAGNOSIS — M24521 Contracture, right elbow: Secondary | ICD-10-CM | POA: Diagnosis not present

## 2022-03-13 DIAGNOSIS — I10 Essential (primary) hypertension: Secondary | ICD-10-CM | POA: Diagnosis not present

## 2022-03-13 DIAGNOSIS — I69891 Dysphagia following other cerebrovascular disease: Secondary | ICD-10-CM | POA: Diagnosis not present

## 2022-03-13 DIAGNOSIS — N183 Chronic kidney disease, stage 3 unspecified: Secondary | ICD-10-CM | POA: Diagnosis not present

## 2022-03-13 DIAGNOSIS — G8103 Flaccid hemiplegia affecting right nondominant side: Secondary | ICD-10-CM | POA: Diagnosis not present

## 2022-03-13 DIAGNOSIS — M170 Bilateral primary osteoarthritis of knee: Secondary | ICD-10-CM | POA: Diagnosis not present

## 2022-03-13 DIAGNOSIS — R1319 Other dysphagia: Secondary | ICD-10-CM | POA: Diagnosis not present

## 2022-03-13 DIAGNOSIS — R627 Adult failure to thrive: Secondary | ICD-10-CM | POA: Diagnosis not present

## 2022-03-13 DIAGNOSIS — K219 Gastro-esophageal reflux disease without esophagitis: Secondary | ICD-10-CM | POA: Diagnosis not present

## 2022-03-13 DIAGNOSIS — I63322 Cerebral infarction due to thrombosis of left anterior cerebral artery: Secondary | ICD-10-CM | POA: Diagnosis not present

## 2022-03-13 DIAGNOSIS — G20C Parkinsonism, unspecified: Secondary | ICD-10-CM | POA: Diagnosis not present

## 2022-03-13 DIAGNOSIS — Z8616 Personal history of COVID-19: Secondary | ICD-10-CM | POA: Diagnosis not present

## 2022-03-13 DIAGNOSIS — Z736 Limitation of activities due to disability: Secondary | ICD-10-CM | POA: Diagnosis not present

## 2022-03-13 DIAGNOSIS — M6259 Muscle wasting and atrophy, not elsewhere classified, multiple sites: Secondary | ICD-10-CM | POA: Diagnosis not present

## 2022-03-13 DIAGNOSIS — Z7409 Other reduced mobility: Secondary | ICD-10-CM | POA: Diagnosis not present

## 2022-03-13 DIAGNOSIS — N390 Urinary tract infection, site not specified: Secondary | ICD-10-CM | POA: Diagnosis not present

## 2022-03-13 DIAGNOSIS — R41841 Cognitive communication deficit: Secondary | ICD-10-CM | POA: Diagnosis not present

## 2022-03-13 DIAGNOSIS — M8580 Other specified disorders of bone density and structure, unspecified site: Secondary | ICD-10-CM | POA: Diagnosis not present

## 2022-03-13 DIAGNOSIS — E785 Hyperlipidemia, unspecified: Secondary | ICD-10-CM | POA: Diagnosis not present

## 2022-03-14 DIAGNOSIS — G8103 Flaccid hemiplegia affecting right nondominant side: Secondary | ICD-10-CM | POA: Diagnosis not present

## 2022-03-14 DIAGNOSIS — M24521 Contracture, right elbow: Secondary | ICD-10-CM | POA: Diagnosis not present

## 2022-03-14 DIAGNOSIS — I63322 Cerebral infarction due to thrombosis of left anterior cerebral artery: Secondary | ICD-10-CM | POA: Diagnosis not present

## 2022-03-14 DIAGNOSIS — I69891 Dysphagia following other cerebrovascular disease: Secondary | ICD-10-CM | POA: Diagnosis not present

## 2022-03-14 DIAGNOSIS — R41841 Cognitive communication deficit: Secondary | ICD-10-CM | POA: Diagnosis not present

## 2022-03-14 DIAGNOSIS — G20C Parkinsonism, unspecified: Secondary | ICD-10-CM | POA: Diagnosis not present

## 2022-03-15 DIAGNOSIS — M24521 Contracture, right elbow: Secondary | ICD-10-CM | POA: Diagnosis not present

## 2022-03-15 DIAGNOSIS — G20C Parkinsonism, unspecified: Secondary | ICD-10-CM | POA: Diagnosis not present

## 2022-03-15 DIAGNOSIS — I69891 Dysphagia following other cerebrovascular disease: Secondary | ICD-10-CM | POA: Diagnosis not present

## 2022-03-15 DIAGNOSIS — R41841 Cognitive communication deficit: Secondary | ICD-10-CM | POA: Diagnosis not present

## 2022-03-15 DIAGNOSIS — G8103 Flaccid hemiplegia affecting right nondominant side: Secondary | ICD-10-CM | POA: Diagnosis not present

## 2022-03-15 DIAGNOSIS — I63322 Cerebral infarction due to thrombosis of left anterior cerebral artery: Secondary | ICD-10-CM | POA: Diagnosis not present

## 2022-03-17 DIAGNOSIS — I69891 Dysphagia following other cerebrovascular disease: Secondary | ICD-10-CM | POA: Diagnosis not present

## 2022-03-17 DIAGNOSIS — R41841 Cognitive communication deficit: Secondary | ICD-10-CM | POA: Diagnosis not present

## 2022-03-17 DIAGNOSIS — I63322 Cerebral infarction due to thrombosis of left anterior cerebral artery: Secondary | ICD-10-CM | POA: Diagnosis not present

## 2022-03-17 DIAGNOSIS — M24521 Contracture, right elbow: Secondary | ICD-10-CM | POA: Diagnosis not present

## 2022-03-17 DIAGNOSIS — G8103 Flaccid hemiplegia affecting right nondominant side: Secondary | ICD-10-CM | POA: Diagnosis not present

## 2022-03-17 DIAGNOSIS — G20C Parkinsonism, unspecified: Secondary | ICD-10-CM | POA: Diagnosis not present

## 2022-03-18 DIAGNOSIS — I69891 Dysphagia following other cerebrovascular disease: Secondary | ICD-10-CM | POA: Diagnosis not present

## 2022-03-18 DIAGNOSIS — G8103 Flaccid hemiplegia affecting right nondominant side: Secondary | ICD-10-CM | POA: Diagnosis not present

## 2022-03-18 DIAGNOSIS — M24521 Contracture, right elbow: Secondary | ICD-10-CM | POA: Diagnosis not present

## 2022-03-18 DIAGNOSIS — I63322 Cerebral infarction due to thrombosis of left anterior cerebral artery: Secondary | ICD-10-CM | POA: Diagnosis not present

## 2022-03-18 DIAGNOSIS — R41841 Cognitive communication deficit: Secondary | ICD-10-CM | POA: Diagnosis not present

## 2022-03-18 DIAGNOSIS — G20C Parkinsonism, unspecified: Secondary | ICD-10-CM | POA: Diagnosis not present

## 2022-03-19 DIAGNOSIS — G20C Parkinsonism, unspecified: Secondary | ICD-10-CM | POA: Diagnosis not present

## 2022-03-19 DIAGNOSIS — M24521 Contracture, right elbow: Secondary | ICD-10-CM | POA: Diagnosis not present

## 2022-03-19 DIAGNOSIS — I69891 Dysphagia following other cerebrovascular disease: Secondary | ICD-10-CM | POA: Diagnosis not present

## 2022-03-19 DIAGNOSIS — I63322 Cerebral infarction due to thrombosis of left anterior cerebral artery: Secondary | ICD-10-CM | POA: Diagnosis not present

## 2022-03-19 DIAGNOSIS — G8103 Flaccid hemiplegia affecting right nondominant side: Secondary | ICD-10-CM | POA: Diagnosis not present

## 2022-03-19 DIAGNOSIS — R41841 Cognitive communication deficit: Secondary | ICD-10-CM | POA: Diagnosis not present

## 2022-03-20 DIAGNOSIS — M24521 Contracture, right elbow: Secondary | ICD-10-CM | POA: Diagnosis not present

## 2022-03-20 DIAGNOSIS — R41841 Cognitive communication deficit: Secondary | ICD-10-CM | POA: Diagnosis not present

## 2022-03-20 DIAGNOSIS — I63322 Cerebral infarction due to thrombosis of left anterior cerebral artery: Secondary | ICD-10-CM | POA: Diagnosis not present

## 2022-03-20 DIAGNOSIS — G8103 Flaccid hemiplegia affecting right nondominant side: Secondary | ICD-10-CM | POA: Diagnosis not present

## 2022-03-20 DIAGNOSIS — I69891 Dysphagia following other cerebrovascular disease: Secondary | ICD-10-CM | POA: Diagnosis not present

## 2022-03-20 DIAGNOSIS — G20C Parkinsonism, unspecified: Secondary | ICD-10-CM | POA: Diagnosis not present

## 2022-03-21 DIAGNOSIS — I69891 Dysphagia following other cerebrovascular disease: Secondary | ICD-10-CM | POA: Diagnosis not present

## 2022-03-21 DIAGNOSIS — G20C Parkinsonism, unspecified: Secondary | ICD-10-CM | POA: Diagnosis not present

## 2022-03-21 DIAGNOSIS — G8103 Flaccid hemiplegia affecting right nondominant side: Secondary | ICD-10-CM | POA: Diagnosis not present

## 2022-03-21 DIAGNOSIS — M24521 Contracture, right elbow: Secondary | ICD-10-CM | POA: Diagnosis not present

## 2022-03-21 DIAGNOSIS — R41841 Cognitive communication deficit: Secondary | ICD-10-CM | POA: Diagnosis not present

## 2022-03-21 DIAGNOSIS — I63322 Cerebral infarction due to thrombosis of left anterior cerebral artery: Secondary | ICD-10-CM | POA: Diagnosis not present

## 2022-03-24 DIAGNOSIS — M24521 Contracture, right elbow: Secondary | ICD-10-CM | POA: Diagnosis not present

## 2022-03-24 DIAGNOSIS — G8103 Flaccid hemiplegia affecting right nondominant side: Secondary | ICD-10-CM | POA: Diagnosis not present

## 2022-03-24 DIAGNOSIS — I63322 Cerebral infarction due to thrombosis of left anterior cerebral artery: Secondary | ICD-10-CM | POA: Diagnosis not present

## 2022-03-24 DIAGNOSIS — R41841 Cognitive communication deficit: Secondary | ICD-10-CM | POA: Diagnosis not present

## 2022-03-24 DIAGNOSIS — I69891 Dysphagia following other cerebrovascular disease: Secondary | ICD-10-CM | POA: Diagnosis not present

## 2022-03-24 DIAGNOSIS — G20C Parkinsonism, unspecified: Secondary | ICD-10-CM | POA: Diagnosis not present

## 2022-03-25 DIAGNOSIS — G20C Parkinsonism, unspecified: Secondary | ICD-10-CM | POA: Diagnosis not present

## 2022-03-25 DIAGNOSIS — G8103 Flaccid hemiplegia affecting right nondominant side: Secondary | ICD-10-CM | POA: Diagnosis not present

## 2022-03-25 DIAGNOSIS — M24521 Contracture, right elbow: Secondary | ICD-10-CM | POA: Diagnosis not present

## 2022-03-25 DIAGNOSIS — I69891 Dysphagia following other cerebrovascular disease: Secondary | ICD-10-CM | POA: Diagnosis not present

## 2022-03-25 DIAGNOSIS — I63322 Cerebral infarction due to thrombosis of left anterior cerebral artery: Secondary | ICD-10-CM | POA: Diagnosis not present

## 2022-03-25 DIAGNOSIS — R41841 Cognitive communication deficit: Secondary | ICD-10-CM | POA: Diagnosis not present

## 2022-03-26 DIAGNOSIS — R41841 Cognitive communication deficit: Secondary | ICD-10-CM | POA: Diagnosis not present

## 2022-03-26 DIAGNOSIS — G8103 Flaccid hemiplegia affecting right nondominant side: Secondary | ICD-10-CM | POA: Diagnosis not present

## 2022-03-26 DIAGNOSIS — I69891 Dysphagia following other cerebrovascular disease: Secondary | ICD-10-CM | POA: Diagnosis not present

## 2022-03-26 DIAGNOSIS — M24521 Contracture, right elbow: Secondary | ICD-10-CM | POA: Diagnosis not present

## 2022-03-26 DIAGNOSIS — I63322 Cerebral infarction due to thrombosis of left anterior cerebral artery: Secondary | ICD-10-CM | POA: Diagnosis not present

## 2022-03-26 DIAGNOSIS — G20C Parkinsonism, unspecified: Secondary | ICD-10-CM | POA: Diagnosis not present

## 2022-03-27 DIAGNOSIS — R41841 Cognitive communication deficit: Secondary | ICD-10-CM | POA: Diagnosis not present

## 2022-03-27 DIAGNOSIS — M24521 Contracture, right elbow: Secondary | ICD-10-CM | POA: Diagnosis not present

## 2022-03-27 DIAGNOSIS — G8103 Flaccid hemiplegia affecting right nondominant side: Secondary | ICD-10-CM | POA: Diagnosis not present

## 2022-03-27 DIAGNOSIS — G20C Parkinsonism, unspecified: Secondary | ICD-10-CM | POA: Diagnosis not present

## 2022-03-27 DIAGNOSIS — I63322 Cerebral infarction due to thrombosis of left anterior cerebral artery: Secondary | ICD-10-CM | POA: Diagnosis not present

## 2022-03-27 DIAGNOSIS — I69891 Dysphagia following other cerebrovascular disease: Secondary | ICD-10-CM | POA: Diagnosis not present

## 2022-03-28 DIAGNOSIS — G20C Parkinsonism, unspecified: Secondary | ICD-10-CM | POA: Diagnosis not present

## 2022-03-28 DIAGNOSIS — I69891 Dysphagia following other cerebrovascular disease: Secondary | ICD-10-CM | POA: Diagnosis not present

## 2022-03-28 DIAGNOSIS — G8103 Flaccid hemiplegia affecting right nondominant side: Secondary | ICD-10-CM | POA: Diagnosis not present

## 2022-03-28 DIAGNOSIS — M24521 Contracture, right elbow: Secondary | ICD-10-CM | POA: Diagnosis not present

## 2022-03-28 DIAGNOSIS — I63322 Cerebral infarction due to thrombosis of left anterior cerebral artery: Secondary | ICD-10-CM | POA: Diagnosis not present

## 2022-03-28 DIAGNOSIS — R41841 Cognitive communication deficit: Secondary | ICD-10-CM | POA: Diagnosis not present

## 2022-03-31 DIAGNOSIS — I63322 Cerebral infarction due to thrombosis of left anterior cerebral artery: Secondary | ICD-10-CM | POA: Diagnosis not present

## 2022-03-31 DIAGNOSIS — I69891 Dysphagia following other cerebrovascular disease: Secondary | ICD-10-CM | POA: Diagnosis not present

## 2022-03-31 DIAGNOSIS — G20C Parkinsonism, unspecified: Secondary | ICD-10-CM | POA: Diagnosis not present

## 2022-03-31 DIAGNOSIS — R41841 Cognitive communication deficit: Secondary | ICD-10-CM | POA: Diagnosis not present

## 2022-03-31 DIAGNOSIS — G8103 Flaccid hemiplegia affecting right nondominant side: Secondary | ICD-10-CM | POA: Diagnosis not present

## 2022-03-31 DIAGNOSIS — M24521 Contracture, right elbow: Secondary | ICD-10-CM | POA: Diagnosis not present

## 2022-04-01 DIAGNOSIS — I63322 Cerebral infarction due to thrombosis of left anterior cerebral artery: Secondary | ICD-10-CM | POA: Diagnosis not present

## 2022-04-01 DIAGNOSIS — M24521 Contracture, right elbow: Secondary | ICD-10-CM | POA: Diagnosis not present

## 2022-04-01 DIAGNOSIS — G8103 Flaccid hemiplegia affecting right nondominant side: Secondary | ICD-10-CM | POA: Diagnosis not present

## 2022-04-01 DIAGNOSIS — G20C Parkinsonism, unspecified: Secondary | ICD-10-CM | POA: Diagnosis not present

## 2022-04-01 DIAGNOSIS — R41841 Cognitive communication deficit: Secondary | ICD-10-CM | POA: Diagnosis not present

## 2022-04-01 DIAGNOSIS — I69891 Dysphagia following other cerebrovascular disease: Secondary | ICD-10-CM | POA: Diagnosis not present

## 2022-04-02 DIAGNOSIS — R41841 Cognitive communication deficit: Secondary | ICD-10-CM | POA: Diagnosis not present

## 2022-04-02 DIAGNOSIS — I63322 Cerebral infarction due to thrombosis of left anterior cerebral artery: Secondary | ICD-10-CM | POA: Diagnosis not present

## 2022-04-02 DIAGNOSIS — M24521 Contracture, right elbow: Secondary | ICD-10-CM | POA: Diagnosis not present

## 2022-04-02 DIAGNOSIS — G20C Parkinsonism, unspecified: Secondary | ICD-10-CM | POA: Diagnosis not present

## 2022-04-02 DIAGNOSIS — I69891 Dysphagia following other cerebrovascular disease: Secondary | ICD-10-CM | POA: Diagnosis not present

## 2022-04-02 DIAGNOSIS — G8103 Flaccid hemiplegia affecting right nondominant side: Secondary | ICD-10-CM | POA: Diagnosis not present

## 2022-04-03 DIAGNOSIS — I63322 Cerebral infarction due to thrombosis of left anterior cerebral artery: Secondary | ICD-10-CM | POA: Diagnosis not present

## 2022-04-03 DIAGNOSIS — R41841 Cognitive communication deficit: Secondary | ICD-10-CM | POA: Diagnosis not present

## 2022-04-03 DIAGNOSIS — G20C Parkinsonism, unspecified: Secondary | ICD-10-CM | POA: Diagnosis not present

## 2022-04-03 DIAGNOSIS — M24521 Contracture, right elbow: Secondary | ICD-10-CM | POA: Diagnosis not present

## 2022-04-03 DIAGNOSIS — I69891 Dysphagia following other cerebrovascular disease: Secondary | ICD-10-CM | POA: Diagnosis not present

## 2022-04-03 DIAGNOSIS — G8103 Flaccid hemiplegia affecting right nondominant side: Secondary | ICD-10-CM | POA: Diagnosis not present

## 2022-04-04 DIAGNOSIS — R41841 Cognitive communication deficit: Secondary | ICD-10-CM | POA: Diagnosis not present

## 2022-04-04 DIAGNOSIS — G20C Parkinsonism, unspecified: Secondary | ICD-10-CM | POA: Diagnosis not present

## 2022-04-04 DIAGNOSIS — I69891 Dysphagia following other cerebrovascular disease: Secondary | ICD-10-CM | POA: Diagnosis not present

## 2022-04-04 DIAGNOSIS — G8103 Flaccid hemiplegia affecting right nondominant side: Secondary | ICD-10-CM | POA: Diagnosis not present

## 2022-04-04 DIAGNOSIS — M24521 Contracture, right elbow: Secondary | ICD-10-CM | POA: Diagnosis not present

## 2022-04-04 DIAGNOSIS — I63322 Cerebral infarction due to thrombosis of left anterior cerebral artery: Secondary | ICD-10-CM | POA: Diagnosis not present

## 2022-04-07 DIAGNOSIS — I69891 Dysphagia following other cerebrovascular disease: Secondary | ICD-10-CM | POA: Diagnosis not present

## 2022-04-07 DIAGNOSIS — R41841 Cognitive communication deficit: Secondary | ICD-10-CM | POA: Diagnosis not present

## 2022-04-07 DIAGNOSIS — I63322 Cerebral infarction due to thrombosis of left anterior cerebral artery: Secondary | ICD-10-CM | POA: Diagnosis not present

## 2022-04-07 DIAGNOSIS — G20C Parkinsonism, unspecified: Secondary | ICD-10-CM | POA: Diagnosis not present

## 2022-04-07 DIAGNOSIS — M24521 Contracture, right elbow: Secondary | ICD-10-CM | POA: Diagnosis not present

## 2022-04-07 DIAGNOSIS — G8103 Flaccid hemiplegia affecting right nondominant side: Secondary | ICD-10-CM | POA: Diagnosis not present

## 2022-04-08 DIAGNOSIS — M24521 Contracture, right elbow: Secondary | ICD-10-CM | POA: Diagnosis not present

## 2022-04-08 DIAGNOSIS — G20C Parkinsonism, unspecified: Secondary | ICD-10-CM | POA: Diagnosis not present

## 2022-04-08 DIAGNOSIS — G8103 Flaccid hemiplegia affecting right nondominant side: Secondary | ICD-10-CM | POA: Diagnosis not present

## 2022-04-08 DIAGNOSIS — R41841 Cognitive communication deficit: Secondary | ICD-10-CM | POA: Diagnosis not present

## 2022-04-08 DIAGNOSIS — I63322 Cerebral infarction due to thrombosis of left anterior cerebral artery: Secondary | ICD-10-CM | POA: Diagnosis not present

## 2022-04-08 DIAGNOSIS — I69891 Dysphagia following other cerebrovascular disease: Secondary | ICD-10-CM | POA: Diagnosis not present

## 2022-04-09 DIAGNOSIS — R41841 Cognitive communication deficit: Secondary | ICD-10-CM | POA: Diagnosis not present

## 2022-04-09 DIAGNOSIS — M24521 Contracture, right elbow: Secondary | ICD-10-CM | POA: Diagnosis not present

## 2022-04-09 DIAGNOSIS — I69891 Dysphagia following other cerebrovascular disease: Secondary | ICD-10-CM | POA: Diagnosis not present

## 2022-04-09 DIAGNOSIS — I63322 Cerebral infarction due to thrombosis of left anterior cerebral artery: Secondary | ICD-10-CM | POA: Diagnosis not present

## 2022-04-09 DIAGNOSIS — G20C Parkinsonism, unspecified: Secondary | ICD-10-CM | POA: Diagnosis not present

## 2022-04-09 DIAGNOSIS — G8103 Flaccid hemiplegia affecting right nondominant side: Secondary | ICD-10-CM | POA: Diagnosis not present

## 2022-04-10 DIAGNOSIS — G8103 Flaccid hemiplegia affecting right nondominant side: Secondary | ICD-10-CM | POA: Diagnosis not present

## 2022-04-10 DIAGNOSIS — G20C Parkinsonism, unspecified: Secondary | ICD-10-CM | POA: Diagnosis not present

## 2022-04-10 DIAGNOSIS — I63322 Cerebral infarction due to thrombosis of left anterior cerebral artery: Secondary | ICD-10-CM | POA: Diagnosis not present

## 2022-04-10 DIAGNOSIS — R41841 Cognitive communication deficit: Secondary | ICD-10-CM | POA: Diagnosis not present

## 2022-04-10 DIAGNOSIS — M24521 Contracture, right elbow: Secondary | ICD-10-CM | POA: Diagnosis not present

## 2022-04-10 DIAGNOSIS — I69891 Dysphagia following other cerebrovascular disease: Secondary | ICD-10-CM | POA: Diagnosis not present

## 2022-04-11 DIAGNOSIS — R627 Adult failure to thrive: Secondary | ICD-10-CM | POA: Diagnosis not present

## 2022-04-11 DIAGNOSIS — I69320 Aphasia following cerebral infarction: Secondary | ICD-10-CM | POA: Diagnosis not present

## 2022-04-11 DIAGNOSIS — R1319 Other dysphagia: Secondary | ICD-10-CM | POA: Diagnosis not present

## 2022-04-11 DIAGNOSIS — Z8673 Personal history of transient ischemic attack (TIA), and cerebral infarction without residual deficits: Secondary | ICD-10-CM | POA: Diagnosis not present

## 2022-04-11 DIAGNOSIS — Z8616 Personal history of COVID-19: Secondary | ICD-10-CM | POA: Diagnosis not present

## 2022-04-11 DIAGNOSIS — M8580 Other specified disorders of bone density and structure, unspecified site: Secondary | ICD-10-CM | POA: Diagnosis not present

## 2022-04-11 DIAGNOSIS — M170 Bilateral primary osteoarthritis of knee: Secondary | ICD-10-CM | POA: Diagnosis not present

## 2022-04-11 DIAGNOSIS — G8193 Hemiplegia, unspecified affecting right nondominant side: Secondary | ICD-10-CM | POA: Diagnosis not present

## 2022-04-11 DIAGNOSIS — G8103 Flaccid hemiplegia affecting right nondominant side: Secondary | ICD-10-CM | POA: Diagnosis not present

## 2022-04-11 DIAGNOSIS — R1311 Dysphagia, oral phase: Secondary | ICD-10-CM | POA: Diagnosis not present

## 2022-04-11 DIAGNOSIS — K219 Gastro-esophageal reflux disease without esophagitis: Secondary | ICD-10-CM | POA: Diagnosis not present

## 2022-04-11 DIAGNOSIS — E559 Vitamin D deficiency, unspecified: Secondary | ICD-10-CM | POA: Diagnosis not present

## 2022-04-11 DIAGNOSIS — Z736 Limitation of activities due to disability: Secondary | ICD-10-CM | POA: Diagnosis not present

## 2022-04-11 DIAGNOSIS — E785 Hyperlipidemia, unspecified: Secondary | ICD-10-CM | POA: Diagnosis not present

## 2022-04-11 DIAGNOSIS — N390 Urinary tract infection, site not specified: Secondary | ICD-10-CM | POA: Diagnosis not present

## 2022-04-11 DIAGNOSIS — R4701 Aphasia: Secondary | ICD-10-CM | POA: Diagnosis not present

## 2022-04-11 DIAGNOSIS — R41841 Cognitive communication deficit: Secondary | ICD-10-CM | POA: Diagnosis not present

## 2022-04-11 DIAGNOSIS — M6259 Muscle wasting and atrophy, not elsewhere classified, multiple sites: Secondary | ICD-10-CM | POA: Diagnosis not present

## 2022-04-11 DIAGNOSIS — N183 Chronic kidney disease, stage 3 unspecified: Secondary | ICD-10-CM | POA: Diagnosis not present

## 2022-04-11 DIAGNOSIS — Z7409 Other reduced mobility: Secondary | ICD-10-CM | POA: Diagnosis not present

## 2022-04-11 DIAGNOSIS — M24521 Contracture, right elbow: Secondary | ICD-10-CM | POA: Diagnosis not present

## 2022-04-11 DIAGNOSIS — I10 Essential (primary) hypertension: Secondary | ICD-10-CM | POA: Diagnosis not present

## 2022-04-11 DIAGNOSIS — E538 Deficiency of other specified B group vitamins: Secondary | ICD-10-CM | POA: Diagnosis not present

## 2022-04-11 DIAGNOSIS — G20C Parkinsonism, unspecified: Secondary | ICD-10-CM | POA: Diagnosis not present

## 2022-04-11 DIAGNOSIS — I69891 Dysphagia following other cerebrovascular disease: Secondary | ICD-10-CM | POA: Diagnosis not present

## 2022-04-11 DIAGNOSIS — I63322 Cerebral infarction due to thrombosis of left anterior cerebral artery: Secondary | ICD-10-CM | POA: Diagnosis not present

## 2022-04-14 DIAGNOSIS — I69320 Aphasia following cerebral infarction: Secondary | ICD-10-CM | POA: Diagnosis not present

## 2022-04-14 DIAGNOSIS — G8103 Flaccid hemiplegia affecting right nondominant side: Secondary | ICD-10-CM | POA: Diagnosis not present

## 2022-04-14 DIAGNOSIS — G20C Parkinsonism, unspecified: Secondary | ICD-10-CM | POA: Diagnosis not present

## 2022-04-14 DIAGNOSIS — I69891 Dysphagia following other cerebrovascular disease: Secondary | ICD-10-CM | POA: Diagnosis not present

## 2022-04-14 DIAGNOSIS — I63322 Cerebral infarction due to thrombosis of left anterior cerebral artery: Secondary | ICD-10-CM | POA: Diagnosis not present

## 2022-04-14 DIAGNOSIS — M24521 Contracture, right elbow: Secondary | ICD-10-CM | POA: Diagnosis not present

## 2022-04-15 DIAGNOSIS — G20C Parkinsonism, unspecified: Secondary | ICD-10-CM | POA: Diagnosis not present

## 2022-04-15 DIAGNOSIS — I69320 Aphasia following cerebral infarction: Secondary | ICD-10-CM | POA: Diagnosis not present

## 2022-04-15 DIAGNOSIS — I69891 Dysphagia following other cerebrovascular disease: Secondary | ICD-10-CM | POA: Diagnosis not present

## 2022-04-15 DIAGNOSIS — I63322 Cerebral infarction due to thrombosis of left anterior cerebral artery: Secondary | ICD-10-CM | POA: Diagnosis not present

## 2022-04-15 DIAGNOSIS — M24521 Contracture, right elbow: Secondary | ICD-10-CM | POA: Diagnosis not present

## 2022-04-15 DIAGNOSIS — G8103 Flaccid hemiplegia affecting right nondominant side: Secondary | ICD-10-CM | POA: Diagnosis not present

## 2022-04-16 DIAGNOSIS — I69891 Dysphagia following other cerebrovascular disease: Secondary | ICD-10-CM | POA: Diagnosis not present

## 2022-04-16 DIAGNOSIS — M24521 Contracture, right elbow: Secondary | ICD-10-CM | POA: Diagnosis not present

## 2022-04-16 DIAGNOSIS — I63322 Cerebral infarction due to thrombosis of left anterior cerebral artery: Secondary | ICD-10-CM | POA: Diagnosis not present

## 2022-04-16 DIAGNOSIS — G8103 Flaccid hemiplegia affecting right nondominant side: Secondary | ICD-10-CM | POA: Diagnosis not present

## 2022-04-16 DIAGNOSIS — G20C Parkinsonism, unspecified: Secondary | ICD-10-CM | POA: Diagnosis not present

## 2022-04-16 DIAGNOSIS — I69320 Aphasia following cerebral infarction: Secondary | ICD-10-CM | POA: Diagnosis not present

## 2022-04-17 DIAGNOSIS — G20C Parkinsonism, unspecified: Secondary | ICD-10-CM | POA: Diagnosis not present

## 2022-04-17 DIAGNOSIS — M24521 Contracture, right elbow: Secondary | ICD-10-CM | POA: Diagnosis not present

## 2022-04-17 DIAGNOSIS — I63322 Cerebral infarction due to thrombosis of left anterior cerebral artery: Secondary | ICD-10-CM | POA: Diagnosis not present

## 2022-04-17 DIAGNOSIS — I69891 Dysphagia following other cerebrovascular disease: Secondary | ICD-10-CM | POA: Diagnosis not present

## 2022-04-17 DIAGNOSIS — I69320 Aphasia following cerebral infarction: Secondary | ICD-10-CM | POA: Diagnosis not present

## 2022-04-17 DIAGNOSIS — G8103 Flaccid hemiplegia affecting right nondominant side: Secondary | ICD-10-CM | POA: Diagnosis not present

## 2022-04-18 DIAGNOSIS — G20C Parkinsonism, unspecified: Secondary | ICD-10-CM | POA: Diagnosis not present

## 2022-04-18 DIAGNOSIS — G8103 Flaccid hemiplegia affecting right nondominant side: Secondary | ICD-10-CM | POA: Diagnosis not present

## 2022-04-18 DIAGNOSIS — I69320 Aphasia following cerebral infarction: Secondary | ICD-10-CM | POA: Diagnosis not present

## 2022-04-18 DIAGNOSIS — M24521 Contracture, right elbow: Secondary | ICD-10-CM | POA: Diagnosis not present

## 2022-04-18 DIAGNOSIS — I69891 Dysphagia following other cerebrovascular disease: Secondary | ICD-10-CM | POA: Diagnosis not present

## 2022-04-18 DIAGNOSIS — I63322 Cerebral infarction due to thrombosis of left anterior cerebral artery: Secondary | ICD-10-CM | POA: Diagnosis not present

## 2022-04-21 DIAGNOSIS — M24521 Contracture, right elbow: Secondary | ICD-10-CM | POA: Diagnosis not present

## 2022-04-21 DIAGNOSIS — G8103 Flaccid hemiplegia affecting right nondominant side: Secondary | ICD-10-CM | POA: Diagnosis not present

## 2022-04-21 DIAGNOSIS — I69320 Aphasia following cerebral infarction: Secondary | ICD-10-CM | POA: Diagnosis not present

## 2022-04-21 DIAGNOSIS — I63322 Cerebral infarction due to thrombosis of left anterior cerebral artery: Secondary | ICD-10-CM | POA: Diagnosis not present

## 2022-04-21 DIAGNOSIS — G20C Parkinsonism, unspecified: Secondary | ICD-10-CM | POA: Diagnosis not present

## 2022-04-21 DIAGNOSIS — I69891 Dysphagia following other cerebrovascular disease: Secondary | ICD-10-CM | POA: Diagnosis not present

## 2022-04-22 DIAGNOSIS — I69891 Dysphagia following other cerebrovascular disease: Secondary | ICD-10-CM | POA: Diagnosis not present

## 2022-04-22 DIAGNOSIS — G8103 Flaccid hemiplegia affecting right nondominant side: Secondary | ICD-10-CM | POA: Diagnosis not present

## 2022-04-22 DIAGNOSIS — I63322 Cerebral infarction due to thrombosis of left anterior cerebral artery: Secondary | ICD-10-CM | POA: Diagnosis not present

## 2022-04-22 DIAGNOSIS — G20C Parkinsonism, unspecified: Secondary | ICD-10-CM | POA: Diagnosis not present

## 2022-04-22 DIAGNOSIS — M24521 Contracture, right elbow: Secondary | ICD-10-CM | POA: Diagnosis not present

## 2022-04-22 DIAGNOSIS — I69320 Aphasia following cerebral infarction: Secondary | ICD-10-CM | POA: Diagnosis not present

## 2022-04-30 DIAGNOSIS — I69891 Dysphagia following other cerebrovascular disease: Secondary | ICD-10-CM | POA: Diagnosis not present

## 2022-04-30 DIAGNOSIS — G20C Parkinsonism, unspecified: Secondary | ICD-10-CM | POA: Diagnosis not present

## 2022-04-30 DIAGNOSIS — I69320 Aphasia following cerebral infarction: Secondary | ICD-10-CM | POA: Diagnosis not present

## 2022-04-30 DIAGNOSIS — I63322 Cerebral infarction due to thrombosis of left anterior cerebral artery: Secondary | ICD-10-CM | POA: Diagnosis not present

## 2022-04-30 DIAGNOSIS — M24521 Contracture, right elbow: Secondary | ICD-10-CM | POA: Diagnosis not present

## 2022-04-30 DIAGNOSIS — G8103 Flaccid hemiplegia affecting right nondominant side: Secondary | ICD-10-CM | POA: Diagnosis not present

## 2022-05-01 DIAGNOSIS — M24521 Contracture, right elbow: Secondary | ICD-10-CM | POA: Diagnosis not present

## 2022-05-01 DIAGNOSIS — G20C Parkinsonism, unspecified: Secondary | ICD-10-CM | POA: Diagnosis not present

## 2022-05-01 DIAGNOSIS — I69320 Aphasia following cerebral infarction: Secondary | ICD-10-CM | POA: Diagnosis not present

## 2022-05-01 DIAGNOSIS — I69891 Dysphagia following other cerebrovascular disease: Secondary | ICD-10-CM | POA: Diagnosis not present

## 2022-05-01 DIAGNOSIS — G8103 Flaccid hemiplegia affecting right nondominant side: Secondary | ICD-10-CM | POA: Diagnosis not present

## 2022-05-01 DIAGNOSIS — I63322 Cerebral infarction due to thrombosis of left anterior cerebral artery: Secondary | ICD-10-CM | POA: Diagnosis not present

## 2022-05-02 DIAGNOSIS — I69891 Dysphagia following other cerebrovascular disease: Secondary | ICD-10-CM | POA: Diagnosis not present

## 2022-05-02 DIAGNOSIS — I63322 Cerebral infarction due to thrombosis of left anterior cerebral artery: Secondary | ICD-10-CM | POA: Diagnosis not present

## 2022-05-02 DIAGNOSIS — G8103 Flaccid hemiplegia affecting right nondominant side: Secondary | ICD-10-CM | POA: Diagnosis not present

## 2022-05-02 DIAGNOSIS — I69320 Aphasia following cerebral infarction: Secondary | ICD-10-CM | POA: Diagnosis not present

## 2022-05-02 DIAGNOSIS — G20C Parkinsonism, unspecified: Secondary | ICD-10-CM | POA: Diagnosis not present

## 2022-05-02 DIAGNOSIS — M24521 Contracture, right elbow: Secondary | ICD-10-CM | POA: Diagnosis not present

## 2022-05-05 DIAGNOSIS — I69320 Aphasia following cerebral infarction: Secondary | ICD-10-CM | POA: Diagnosis not present

## 2022-05-05 DIAGNOSIS — G20C Parkinsonism, unspecified: Secondary | ICD-10-CM | POA: Diagnosis not present

## 2022-05-05 DIAGNOSIS — I63322 Cerebral infarction due to thrombosis of left anterior cerebral artery: Secondary | ICD-10-CM | POA: Diagnosis not present

## 2022-05-05 DIAGNOSIS — G8103 Flaccid hemiplegia affecting right nondominant side: Secondary | ICD-10-CM | POA: Diagnosis not present

## 2022-05-05 DIAGNOSIS — M24521 Contracture, right elbow: Secondary | ICD-10-CM | POA: Diagnosis not present

## 2022-05-05 DIAGNOSIS — I69891 Dysphagia following other cerebrovascular disease: Secondary | ICD-10-CM | POA: Diagnosis not present

## 2022-05-06 DIAGNOSIS — I63322 Cerebral infarction due to thrombosis of left anterior cerebral artery: Secondary | ICD-10-CM | POA: Diagnosis not present

## 2022-05-06 DIAGNOSIS — I69320 Aphasia following cerebral infarction: Secondary | ICD-10-CM | POA: Diagnosis not present

## 2022-05-06 DIAGNOSIS — I69891 Dysphagia following other cerebrovascular disease: Secondary | ICD-10-CM | POA: Diagnosis not present

## 2022-05-06 DIAGNOSIS — G8103 Flaccid hemiplegia affecting right nondominant side: Secondary | ICD-10-CM | POA: Diagnosis not present

## 2022-05-06 DIAGNOSIS — G20C Parkinsonism, unspecified: Secondary | ICD-10-CM | POA: Diagnosis not present

## 2022-05-06 DIAGNOSIS — H353134 Nonexudative age-related macular degeneration, bilateral, advanced atrophic with subfoveal involvement: Secondary | ICD-10-CM | POA: Diagnosis not present

## 2022-05-06 DIAGNOSIS — G20A1 Parkinson's disease without dyskinesia, without mention of fluctuations: Secondary | ICD-10-CM | POA: Diagnosis not present

## 2022-05-06 DIAGNOSIS — Z961 Presence of intraocular lens: Secondary | ICD-10-CM | POA: Diagnosis not present

## 2022-05-06 DIAGNOSIS — M24521 Contracture, right elbow: Secondary | ICD-10-CM | POA: Diagnosis not present

## 2022-05-07 DIAGNOSIS — I63322 Cerebral infarction due to thrombosis of left anterior cerebral artery: Secondary | ICD-10-CM | POA: Diagnosis not present

## 2022-05-07 DIAGNOSIS — G8103 Flaccid hemiplegia affecting right nondominant side: Secondary | ICD-10-CM | POA: Diagnosis not present

## 2022-05-07 DIAGNOSIS — M24521 Contracture, right elbow: Secondary | ICD-10-CM | POA: Diagnosis not present

## 2022-05-07 DIAGNOSIS — G20C Parkinsonism, unspecified: Secondary | ICD-10-CM | POA: Diagnosis not present

## 2022-05-07 DIAGNOSIS — I69320 Aphasia following cerebral infarction: Secondary | ICD-10-CM | POA: Diagnosis not present

## 2022-05-07 DIAGNOSIS — I69891 Dysphagia following other cerebrovascular disease: Secondary | ICD-10-CM | POA: Diagnosis not present

## 2022-05-08 DIAGNOSIS — I69891 Dysphagia following other cerebrovascular disease: Secondary | ICD-10-CM | POA: Diagnosis not present

## 2022-05-08 DIAGNOSIS — G20C Parkinsonism, unspecified: Secondary | ICD-10-CM | POA: Diagnosis not present

## 2022-05-08 DIAGNOSIS — I69320 Aphasia following cerebral infarction: Secondary | ICD-10-CM | POA: Diagnosis not present

## 2022-05-08 DIAGNOSIS — M24521 Contracture, right elbow: Secondary | ICD-10-CM | POA: Diagnosis not present

## 2022-05-08 DIAGNOSIS — G8103 Flaccid hemiplegia affecting right nondominant side: Secondary | ICD-10-CM | POA: Diagnosis not present

## 2022-05-08 DIAGNOSIS — I63322 Cerebral infarction due to thrombosis of left anterior cerebral artery: Secondary | ICD-10-CM | POA: Diagnosis not present

## 2022-05-09 DIAGNOSIS — M24521 Contracture, right elbow: Secondary | ICD-10-CM | POA: Diagnosis not present

## 2022-05-09 DIAGNOSIS — G20C Parkinsonism, unspecified: Secondary | ICD-10-CM | POA: Diagnosis not present

## 2022-05-09 DIAGNOSIS — I63322 Cerebral infarction due to thrombosis of left anterior cerebral artery: Secondary | ICD-10-CM | POA: Diagnosis not present

## 2022-05-09 DIAGNOSIS — I69320 Aphasia following cerebral infarction: Secondary | ICD-10-CM | POA: Diagnosis not present

## 2022-05-09 DIAGNOSIS — G8103 Flaccid hemiplegia affecting right nondominant side: Secondary | ICD-10-CM | POA: Diagnosis not present

## 2022-05-09 DIAGNOSIS — I69891 Dysphagia following other cerebrovascular disease: Secondary | ICD-10-CM | POA: Diagnosis not present

## 2022-05-12 DIAGNOSIS — Z8616 Personal history of COVID-19: Secondary | ICD-10-CM | POA: Diagnosis not present

## 2022-05-12 DIAGNOSIS — G8103 Flaccid hemiplegia affecting right nondominant side: Secondary | ICD-10-CM | POA: Diagnosis not present

## 2022-05-12 DIAGNOSIS — E559 Vitamin D deficiency, unspecified: Secondary | ICD-10-CM | POA: Diagnosis not present

## 2022-05-12 DIAGNOSIS — Z7409 Other reduced mobility: Secondary | ICD-10-CM | POA: Diagnosis not present

## 2022-05-12 DIAGNOSIS — I69320 Aphasia following cerebral infarction: Secondary | ICD-10-CM | POA: Diagnosis not present

## 2022-05-12 DIAGNOSIS — Z736 Limitation of activities due to disability: Secondary | ICD-10-CM | POA: Diagnosis not present

## 2022-05-12 DIAGNOSIS — I10 Essential (primary) hypertension: Secondary | ICD-10-CM | POA: Diagnosis not present

## 2022-05-12 DIAGNOSIS — M6259 Muscle wasting and atrophy, not elsewhere classified, multiple sites: Secondary | ICD-10-CM | POA: Diagnosis not present

## 2022-05-12 DIAGNOSIS — R1311 Dysphagia, oral phase: Secondary | ICD-10-CM | POA: Diagnosis not present

## 2022-05-12 DIAGNOSIS — M170 Bilateral primary osteoarthritis of knee: Secondary | ICD-10-CM | POA: Diagnosis not present

## 2022-05-12 DIAGNOSIS — I63322 Cerebral infarction due to thrombosis of left anterior cerebral artery: Secondary | ICD-10-CM | POA: Diagnosis not present

## 2022-05-12 DIAGNOSIS — M8580 Other specified disorders of bone density and structure, unspecified site: Secondary | ICD-10-CM | POA: Diagnosis not present

## 2022-05-12 DIAGNOSIS — I69891 Dysphagia following other cerebrovascular disease: Secondary | ICD-10-CM | POA: Diagnosis not present

## 2022-05-12 DIAGNOSIS — R4701 Aphasia: Secondary | ICD-10-CM | POA: Diagnosis not present

## 2022-05-12 DIAGNOSIS — M24521 Contracture, right elbow: Secondary | ICD-10-CM | POA: Diagnosis not present

## 2022-05-12 DIAGNOSIS — R41841 Cognitive communication deficit: Secondary | ICD-10-CM | POA: Diagnosis not present

## 2022-05-12 DIAGNOSIS — R1319 Other dysphagia: Secondary | ICD-10-CM | POA: Diagnosis not present

## 2022-05-12 DIAGNOSIS — G20C Parkinsonism, unspecified: Secondary | ICD-10-CM | POA: Diagnosis not present

## 2022-05-12 DIAGNOSIS — N183 Chronic kidney disease, stage 3 unspecified: Secondary | ICD-10-CM | POA: Diagnosis not present

## 2022-05-12 DIAGNOSIS — R627 Adult failure to thrive: Secondary | ICD-10-CM | POA: Diagnosis not present

## 2022-05-12 DIAGNOSIS — E785 Hyperlipidemia, unspecified: Secondary | ICD-10-CM | POA: Diagnosis not present

## 2022-05-12 DIAGNOSIS — E538 Deficiency of other specified B group vitamins: Secondary | ICD-10-CM | POA: Diagnosis not present

## 2022-05-12 DIAGNOSIS — N390 Urinary tract infection, site not specified: Secondary | ICD-10-CM | POA: Diagnosis not present

## 2022-05-12 DIAGNOSIS — K219 Gastro-esophageal reflux disease without esophagitis: Secondary | ICD-10-CM | POA: Diagnosis not present

## 2022-05-13 DIAGNOSIS — G20C Parkinsonism, unspecified: Secondary | ICD-10-CM | POA: Diagnosis not present

## 2022-05-13 DIAGNOSIS — N183 Chronic kidney disease, stage 3 unspecified: Secondary | ICD-10-CM | POA: Diagnosis not present

## 2022-05-13 DIAGNOSIS — I69891 Dysphagia following other cerebrovascular disease: Secondary | ICD-10-CM | POA: Diagnosis not present

## 2022-05-13 DIAGNOSIS — E785 Hyperlipidemia, unspecified: Secondary | ICD-10-CM | POA: Diagnosis not present

## 2022-05-13 DIAGNOSIS — I69320 Aphasia following cerebral infarction: Secondary | ICD-10-CM | POA: Diagnosis not present

## 2022-05-13 DIAGNOSIS — I63322 Cerebral infarction due to thrombosis of left anterior cerebral artery: Secondary | ICD-10-CM | POA: Diagnosis not present

## 2022-05-13 DIAGNOSIS — I10 Essential (primary) hypertension: Secondary | ICD-10-CM | POA: Diagnosis not present

## 2022-05-13 DIAGNOSIS — G8193 Hemiplegia, unspecified affecting right nondominant side: Secondary | ICD-10-CM | POA: Diagnosis not present

## 2022-05-13 DIAGNOSIS — M24521 Contracture, right elbow: Secondary | ICD-10-CM | POA: Diagnosis not present

## 2022-05-13 DIAGNOSIS — G8103 Flaccid hemiplegia affecting right nondominant side: Secondary | ICD-10-CM | POA: Diagnosis not present

## 2022-05-14 DIAGNOSIS — I69320 Aphasia following cerebral infarction: Secondary | ICD-10-CM | POA: Diagnosis not present

## 2022-05-14 DIAGNOSIS — I63322 Cerebral infarction due to thrombosis of left anterior cerebral artery: Secondary | ICD-10-CM | POA: Diagnosis not present

## 2022-05-14 DIAGNOSIS — M24521 Contracture, right elbow: Secondary | ICD-10-CM | POA: Diagnosis not present

## 2022-05-14 DIAGNOSIS — G8103 Flaccid hemiplegia affecting right nondominant side: Secondary | ICD-10-CM | POA: Diagnosis not present

## 2022-05-14 DIAGNOSIS — G20C Parkinsonism, unspecified: Secondary | ICD-10-CM | POA: Diagnosis not present

## 2022-05-14 DIAGNOSIS — I69891 Dysphagia following other cerebrovascular disease: Secondary | ICD-10-CM | POA: Diagnosis not present

## 2022-05-15 DIAGNOSIS — G8103 Flaccid hemiplegia affecting right nondominant side: Secondary | ICD-10-CM | POA: Diagnosis not present

## 2022-05-15 DIAGNOSIS — I69320 Aphasia following cerebral infarction: Secondary | ICD-10-CM | POA: Diagnosis not present

## 2022-05-15 DIAGNOSIS — I63322 Cerebral infarction due to thrombosis of left anterior cerebral artery: Secondary | ICD-10-CM | POA: Diagnosis not present

## 2022-05-15 DIAGNOSIS — I69891 Dysphagia following other cerebrovascular disease: Secondary | ICD-10-CM | POA: Diagnosis not present

## 2022-05-15 DIAGNOSIS — M24521 Contracture, right elbow: Secondary | ICD-10-CM | POA: Diagnosis not present

## 2022-05-15 DIAGNOSIS — G20C Parkinsonism, unspecified: Secondary | ICD-10-CM | POA: Diagnosis not present

## 2022-05-16 DIAGNOSIS — G8103 Flaccid hemiplegia affecting right nondominant side: Secondary | ICD-10-CM | POA: Diagnosis not present

## 2022-05-16 DIAGNOSIS — I69320 Aphasia following cerebral infarction: Secondary | ICD-10-CM | POA: Diagnosis not present

## 2022-05-16 DIAGNOSIS — I63322 Cerebral infarction due to thrombosis of left anterior cerebral artery: Secondary | ICD-10-CM | POA: Diagnosis not present

## 2022-05-16 DIAGNOSIS — G20C Parkinsonism, unspecified: Secondary | ICD-10-CM | POA: Diagnosis not present

## 2022-05-16 DIAGNOSIS — I69891 Dysphagia following other cerebrovascular disease: Secondary | ICD-10-CM | POA: Diagnosis not present

## 2022-05-16 DIAGNOSIS — M24521 Contracture, right elbow: Secondary | ICD-10-CM | POA: Diagnosis not present

## 2022-05-19 DIAGNOSIS — G20C Parkinsonism, unspecified: Secondary | ICD-10-CM | POA: Diagnosis not present

## 2022-05-19 DIAGNOSIS — I69320 Aphasia following cerebral infarction: Secondary | ICD-10-CM | POA: Diagnosis not present

## 2022-05-19 DIAGNOSIS — M24521 Contracture, right elbow: Secondary | ICD-10-CM | POA: Diagnosis not present

## 2022-05-19 DIAGNOSIS — I63322 Cerebral infarction due to thrombosis of left anterior cerebral artery: Secondary | ICD-10-CM | POA: Diagnosis not present

## 2022-05-19 DIAGNOSIS — I69891 Dysphagia following other cerebrovascular disease: Secondary | ICD-10-CM | POA: Diagnosis not present

## 2022-05-19 DIAGNOSIS — G8103 Flaccid hemiplegia affecting right nondominant side: Secondary | ICD-10-CM | POA: Diagnosis not present

## 2022-05-20 DIAGNOSIS — M24521 Contracture, right elbow: Secondary | ICD-10-CM | POA: Diagnosis not present

## 2022-05-20 DIAGNOSIS — G8103 Flaccid hemiplegia affecting right nondominant side: Secondary | ICD-10-CM | POA: Diagnosis not present

## 2022-05-20 DIAGNOSIS — I69891 Dysphagia following other cerebrovascular disease: Secondary | ICD-10-CM | POA: Diagnosis not present

## 2022-05-20 DIAGNOSIS — I69320 Aphasia following cerebral infarction: Secondary | ICD-10-CM | POA: Diagnosis not present

## 2022-05-20 DIAGNOSIS — G20C Parkinsonism, unspecified: Secondary | ICD-10-CM | POA: Diagnosis not present

## 2022-05-20 DIAGNOSIS — I63322 Cerebral infarction due to thrombosis of left anterior cerebral artery: Secondary | ICD-10-CM | POA: Diagnosis not present

## 2022-05-21 DIAGNOSIS — M24521 Contracture, right elbow: Secondary | ICD-10-CM | POA: Diagnosis not present

## 2022-05-21 DIAGNOSIS — G8103 Flaccid hemiplegia affecting right nondominant side: Secondary | ICD-10-CM | POA: Diagnosis not present

## 2022-05-21 DIAGNOSIS — I63322 Cerebral infarction due to thrombosis of left anterior cerebral artery: Secondary | ICD-10-CM | POA: Diagnosis not present

## 2022-05-21 DIAGNOSIS — I69891 Dysphagia following other cerebrovascular disease: Secondary | ICD-10-CM | POA: Diagnosis not present

## 2022-05-21 DIAGNOSIS — G20C Parkinsonism, unspecified: Secondary | ICD-10-CM | POA: Diagnosis not present

## 2022-05-21 DIAGNOSIS — I69320 Aphasia following cerebral infarction: Secondary | ICD-10-CM | POA: Diagnosis not present

## 2022-05-22 DIAGNOSIS — G20C Parkinsonism, unspecified: Secondary | ICD-10-CM | POA: Diagnosis not present

## 2022-05-22 DIAGNOSIS — I69320 Aphasia following cerebral infarction: Secondary | ICD-10-CM | POA: Diagnosis not present

## 2022-05-22 DIAGNOSIS — G8103 Flaccid hemiplegia affecting right nondominant side: Secondary | ICD-10-CM | POA: Diagnosis not present

## 2022-05-22 DIAGNOSIS — I69891 Dysphagia following other cerebrovascular disease: Secondary | ICD-10-CM | POA: Diagnosis not present

## 2022-05-22 DIAGNOSIS — M24521 Contracture, right elbow: Secondary | ICD-10-CM | POA: Diagnosis not present

## 2022-05-22 DIAGNOSIS — I63322 Cerebral infarction due to thrombosis of left anterior cerebral artery: Secondary | ICD-10-CM | POA: Diagnosis not present

## 2022-05-23 DIAGNOSIS — I69320 Aphasia following cerebral infarction: Secondary | ICD-10-CM | POA: Diagnosis not present

## 2022-05-23 DIAGNOSIS — I69891 Dysphagia following other cerebrovascular disease: Secondary | ICD-10-CM | POA: Diagnosis not present

## 2022-05-23 DIAGNOSIS — G8103 Flaccid hemiplegia affecting right nondominant side: Secondary | ICD-10-CM | POA: Diagnosis not present

## 2022-05-23 DIAGNOSIS — M24521 Contracture, right elbow: Secondary | ICD-10-CM | POA: Diagnosis not present

## 2022-05-23 DIAGNOSIS — G20C Parkinsonism, unspecified: Secondary | ICD-10-CM | POA: Diagnosis not present

## 2022-05-23 DIAGNOSIS — I63322 Cerebral infarction due to thrombosis of left anterior cerebral artery: Secondary | ICD-10-CM | POA: Diagnosis not present

## 2022-05-26 DIAGNOSIS — G20C Parkinsonism, unspecified: Secondary | ICD-10-CM | POA: Diagnosis not present

## 2022-05-26 DIAGNOSIS — G8103 Flaccid hemiplegia affecting right nondominant side: Secondary | ICD-10-CM | POA: Diagnosis not present

## 2022-05-26 DIAGNOSIS — I69320 Aphasia following cerebral infarction: Secondary | ICD-10-CM | POA: Diagnosis not present

## 2022-05-26 DIAGNOSIS — I69891 Dysphagia following other cerebrovascular disease: Secondary | ICD-10-CM | POA: Diagnosis not present

## 2022-05-26 DIAGNOSIS — M24521 Contracture, right elbow: Secondary | ICD-10-CM | POA: Diagnosis not present

## 2022-05-26 DIAGNOSIS — I63322 Cerebral infarction due to thrombosis of left anterior cerebral artery: Secondary | ICD-10-CM | POA: Diagnosis not present

## 2022-05-27 DIAGNOSIS — G20C Parkinsonism, unspecified: Secondary | ICD-10-CM | POA: Diagnosis not present

## 2022-05-27 DIAGNOSIS — I69320 Aphasia following cerebral infarction: Secondary | ICD-10-CM | POA: Diagnosis not present

## 2022-05-27 DIAGNOSIS — I69891 Dysphagia following other cerebrovascular disease: Secondary | ICD-10-CM | POA: Diagnosis not present

## 2022-05-27 DIAGNOSIS — M24521 Contracture, right elbow: Secondary | ICD-10-CM | POA: Diagnosis not present

## 2022-05-27 DIAGNOSIS — G8103 Flaccid hemiplegia affecting right nondominant side: Secondary | ICD-10-CM | POA: Diagnosis not present

## 2022-05-27 DIAGNOSIS — I63322 Cerebral infarction due to thrombosis of left anterior cerebral artery: Secondary | ICD-10-CM | POA: Diagnosis not present

## 2022-05-28 DIAGNOSIS — I69320 Aphasia following cerebral infarction: Secondary | ICD-10-CM | POA: Diagnosis not present

## 2022-05-28 DIAGNOSIS — I69891 Dysphagia following other cerebrovascular disease: Secondary | ICD-10-CM | POA: Diagnosis not present

## 2022-05-28 DIAGNOSIS — G20C Parkinsonism, unspecified: Secondary | ICD-10-CM | POA: Diagnosis not present

## 2022-05-28 DIAGNOSIS — G8103 Flaccid hemiplegia affecting right nondominant side: Secondary | ICD-10-CM | POA: Diagnosis not present

## 2022-05-28 DIAGNOSIS — I63322 Cerebral infarction due to thrombosis of left anterior cerebral artery: Secondary | ICD-10-CM | POA: Diagnosis not present

## 2022-05-28 DIAGNOSIS — M24521 Contracture, right elbow: Secondary | ICD-10-CM | POA: Diagnosis not present

## 2022-05-29 DIAGNOSIS — I63322 Cerebral infarction due to thrombosis of left anterior cerebral artery: Secondary | ICD-10-CM | POA: Diagnosis not present

## 2022-05-29 DIAGNOSIS — G8103 Flaccid hemiplegia affecting right nondominant side: Secondary | ICD-10-CM | POA: Diagnosis not present

## 2022-05-29 DIAGNOSIS — I69891 Dysphagia following other cerebrovascular disease: Secondary | ICD-10-CM | POA: Diagnosis not present

## 2022-05-29 DIAGNOSIS — I69320 Aphasia following cerebral infarction: Secondary | ICD-10-CM | POA: Diagnosis not present

## 2022-05-29 DIAGNOSIS — G20C Parkinsonism, unspecified: Secondary | ICD-10-CM | POA: Diagnosis not present

## 2022-05-29 DIAGNOSIS — M24521 Contracture, right elbow: Secondary | ICD-10-CM | POA: Diagnosis not present

## 2022-05-30 DIAGNOSIS — I69891 Dysphagia following other cerebrovascular disease: Secondary | ICD-10-CM | POA: Diagnosis not present

## 2022-05-30 DIAGNOSIS — M24521 Contracture, right elbow: Secondary | ICD-10-CM | POA: Diagnosis not present

## 2022-05-30 DIAGNOSIS — G8103 Flaccid hemiplegia affecting right nondominant side: Secondary | ICD-10-CM | POA: Diagnosis not present

## 2022-05-30 DIAGNOSIS — I63322 Cerebral infarction due to thrombosis of left anterior cerebral artery: Secondary | ICD-10-CM | POA: Diagnosis not present

## 2022-05-30 DIAGNOSIS — I69320 Aphasia following cerebral infarction: Secondary | ICD-10-CM | POA: Diagnosis not present

## 2022-05-30 DIAGNOSIS — G20C Parkinsonism, unspecified: Secondary | ICD-10-CM | POA: Diagnosis not present

## 2022-06-02 DIAGNOSIS — G8103 Flaccid hemiplegia affecting right nondominant side: Secondary | ICD-10-CM | POA: Diagnosis not present

## 2022-06-02 DIAGNOSIS — G20C Parkinsonism, unspecified: Secondary | ICD-10-CM | POA: Diagnosis not present

## 2022-06-02 DIAGNOSIS — I69891 Dysphagia following other cerebrovascular disease: Secondary | ICD-10-CM | POA: Diagnosis not present

## 2022-06-02 DIAGNOSIS — M24521 Contracture, right elbow: Secondary | ICD-10-CM | POA: Diagnosis not present

## 2022-06-02 DIAGNOSIS — I69320 Aphasia following cerebral infarction: Secondary | ICD-10-CM | POA: Diagnosis not present

## 2022-06-02 DIAGNOSIS — I63322 Cerebral infarction due to thrombosis of left anterior cerebral artery: Secondary | ICD-10-CM | POA: Diagnosis not present

## 2022-06-03 DIAGNOSIS — I69320 Aphasia following cerebral infarction: Secondary | ICD-10-CM | POA: Diagnosis not present

## 2022-06-03 DIAGNOSIS — I63322 Cerebral infarction due to thrombosis of left anterior cerebral artery: Secondary | ICD-10-CM | POA: Diagnosis not present

## 2022-06-03 DIAGNOSIS — G8103 Flaccid hemiplegia affecting right nondominant side: Secondary | ICD-10-CM | POA: Diagnosis not present

## 2022-06-03 DIAGNOSIS — L603 Nail dystrophy: Secondary | ICD-10-CM | POA: Diagnosis not present

## 2022-06-03 DIAGNOSIS — G20C Parkinsonism, unspecified: Secondary | ICD-10-CM | POA: Diagnosis not present

## 2022-06-03 DIAGNOSIS — I739 Peripheral vascular disease, unspecified: Secondary | ICD-10-CM | POA: Diagnosis not present

## 2022-06-03 DIAGNOSIS — I69891 Dysphagia following other cerebrovascular disease: Secondary | ICD-10-CM | POA: Diagnosis not present

## 2022-06-03 DIAGNOSIS — M24521 Contracture, right elbow: Secondary | ICD-10-CM | POA: Diagnosis not present

## 2022-06-03 DIAGNOSIS — B351 Tinea unguium: Secondary | ICD-10-CM | POA: Diagnosis not present

## 2022-06-04 DIAGNOSIS — G20C Parkinsonism, unspecified: Secondary | ICD-10-CM | POA: Diagnosis not present

## 2022-06-04 DIAGNOSIS — I63322 Cerebral infarction due to thrombosis of left anterior cerebral artery: Secondary | ICD-10-CM | POA: Diagnosis not present

## 2022-06-04 DIAGNOSIS — G8103 Flaccid hemiplegia affecting right nondominant side: Secondary | ICD-10-CM | POA: Diagnosis not present

## 2022-06-04 DIAGNOSIS — I69891 Dysphagia following other cerebrovascular disease: Secondary | ICD-10-CM | POA: Diagnosis not present

## 2022-06-04 DIAGNOSIS — M24521 Contracture, right elbow: Secondary | ICD-10-CM | POA: Diagnosis not present

## 2022-06-04 DIAGNOSIS — I69320 Aphasia following cerebral infarction: Secondary | ICD-10-CM | POA: Diagnosis not present

## 2022-06-05 DIAGNOSIS — M24521 Contracture, right elbow: Secondary | ICD-10-CM | POA: Diagnosis not present

## 2022-06-05 DIAGNOSIS — I69320 Aphasia following cerebral infarction: Secondary | ICD-10-CM | POA: Diagnosis not present

## 2022-06-05 DIAGNOSIS — G20C Parkinsonism, unspecified: Secondary | ICD-10-CM | POA: Diagnosis not present

## 2022-06-05 DIAGNOSIS — G8103 Flaccid hemiplegia affecting right nondominant side: Secondary | ICD-10-CM | POA: Diagnosis not present

## 2022-06-05 DIAGNOSIS — I63322 Cerebral infarction due to thrombosis of left anterior cerebral artery: Secondary | ICD-10-CM | POA: Diagnosis not present

## 2022-06-05 DIAGNOSIS — I69891 Dysphagia following other cerebrovascular disease: Secondary | ICD-10-CM | POA: Diagnosis not present

## 2022-06-06 DIAGNOSIS — I69320 Aphasia following cerebral infarction: Secondary | ICD-10-CM | POA: Diagnosis not present

## 2022-06-06 DIAGNOSIS — G20C Parkinsonism, unspecified: Secondary | ICD-10-CM | POA: Diagnosis not present

## 2022-06-06 DIAGNOSIS — G8103 Flaccid hemiplegia affecting right nondominant side: Secondary | ICD-10-CM | POA: Diagnosis not present

## 2022-06-06 DIAGNOSIS — I69891 Dysphagia following other cerebrovascular disease: Secondary | ICD-10-CM | POA: Diagnosis not present

## 2022-06-06 DIAGNOSIS — M24521 Contracture, right elbow: Secondary | ICD-10-CM | POA: Diagnosis not present

## 2022-06-06 DIAGNOSIS — I63322 Cerebral infarction due to thrombosis of left anterior cerebral artery: Secondary | ICD-10-CM | POA: Diagnosis not present

## 2022-06-10 DIAGNOSIS — G20C Parkinsonism, unspecified: Secondary | ICD-10-CM | POA: Diagnosis not present

## 2022-06-10 DIAGNOSIS — I69891 Dysphagia following other cerebrovascular disease: Secondary | ICD-10-CM | POA: Diagnosis not present

## 2022-06-10 DIAGNOSIS — M24521 Contracture, right elbow: Secondary | ICD-10-CM | POA: Diagnosis not present

## 2022-06-10 DIAGNOSIS — G8103 Flaccid hemiplegia affecting right nondominant side: Secondary | ICD-10-CM | POA: Diagnosis not present

## 2022-06-10 DIAGNOSIS — I63322 Cerebral infarction due to thrombosis of left anterior cerebral artery: Secondary | ICD-10-CM | POA: Diagnosis not present

## 2022-06-10 DIAGNOSIS — I69320 Aphasia following cerebral infarction: Secondary | ICD-10-CM | POA: Diagnosis not present

## 2022-06-11 DIAGNOSIS — E559 Vitamin D deficiency, unspecified: Secondary | ICD-10-CM | POA: Diagnosis not present

## 2022-06-11 DIAGNOSIS — G8103 Flaccid hemiplegia affecting right nondominant side: Secondary | ICD-10-CM | POA: Diagnosis not present

## 2022-06-11 DIAGNOSIS — R41841 Cognitive communication deficit: Secondary | ICD-10-CM | POA: Diagnosis not present

## 2022-06-11 DIAGNOSIS — N183 Chronic kidney disease, stage 3 unspecified: Secondary | ICD-10-CM | POA: Diagnosis not present

## 2022-06-11 DIAGNOSIS — R4701 Aphasia: Secondary | ICD-10-CM | POA: Diagnosis not present

## 2022-06-11 DIAGNOSIS — M6259 Muscle wasting and atrophy, not elsewhere classified, multiple sites: Secondary | ICD-10-CM | POA: Diagnosis not present

## 2022-06-11 DIAGNOSIS — R627 Adult failure to thrive: Secondary | ICD-10-CM | POA: Diagnosis not present

## 2022-06-11 DIAGNOSIS — Z8616 Personal history of COVID-19: Secondary | ICD-10-CM | POA: Diagnosis not present

## 2022-06-11 DIAGNOSIS — R1319 Other dysphagia: Secondary | ICD-10-CM | POA: Diagnosis not present

## 2022-06-11 DIAGNOSIS — E785 Hyperlipidemia, unspecified: Secondary | ICD-10-CM | POA: Diagnosis not present

## 2022-06-11 DIAGNOSIS — I69891 Dysphagia following other cerebrovascular disease: Secondary | ICD-10-CM | POA: Diagnosis not present

## 2022-06-11 DIAGNOSIS — M24521 Contracture, right elbow: Secondary | ICD-10-CM | POA: Diagnosis not present

## 2022-06-11 DIAGNOSIS — R1311 Dysphagia, oral phase: Secondary | ICD-10-CM | POA: Diagnosis not present

## 2022-06-11 DIAGNOSIS — K219 Gastro-esophageal reflux disease without esophagitis: Secondary | ICD-10-CM | POA: Diagnosis not present

## 2022-06-11 DIAGNOSIS — I69320 Aphasia following cerebral infarction: Secondary | ICD-10-CM | POA: Diagnosis not present

## 2022-06-11 DIAGNOSIS — N390 Urinary tract infection, site not specified: Secondary | ICD-10-CM | POA: Diagnosis not present

## 2022-06-11 DIAGNOSIS — E538 Deficiency of other specified B group vitamins: Secondary | ICD-10-CM | POA: Diagnosis not present

## 2022-06-11 DIAGNOSIS — M8580 Other specified disorders of bone density and structure, unspecified site: Secondary | ICD-10-CM | POA: Diagnosis not present

## 2022-06-11 DIAGNOSIS — Z7409 Other reduced mobility: Secondary | ICD-10-CM | POA: Diagnosis not present

## 2022-06-11 DIAGNOSIS — I10 Essential (primary) hypertension: Secondary | ICD-10-CM | POA: Diagnosis not present

## 2022-06-11 DIAGNOSIS — G20C Parkinsonism, unspecified: Secondary | ICD-10-CM | POA: Diagnosis not present

## 2022-06-11 DIAGNOSIS — M170 Bilateral primary osteoarthritis of knee: Secondary | ICD-10-CM | POA: Diagnosis not present

## 2022-06-11 DIAGNOSIS — Z736 Limitation of activities due to disability: Secondary | ICD-10-CM | POA: Diagnosis not present

## 2022-06-11 DIAGNOSIS — I63322 Cerebral infarction due to thrombosis of left anterior cerebral artery: Secondary | ICD-10-CM | POA: Diagnosis not present

## 2022-06-12 DIAGNOSIS — M24521 Contracture, right elbow: Secondary | ICD-10-CM | POA: Diagnosis not present

## 2022-06-12 DIAGNOSIS — I63322 Cerebral infarction due to thrombosis of left anterior cerebral artery: Secondary | ICD-10-CM | POA: Diagnosis not present

## 2022-06-12 DIAGNOSIS — G20C Parkinsonism, unspecified: Secondary | ICD-10-CM | POA: Diagnosis not present

## 2022-06-12 DIAGNOSIS — I69891 Dysphagia following other cerebrovascular disease: Secondary | ICD-10-CM | POA: Diagnosis not present

## 2022-06-12 DIAGNOSIS — G8103 Flaccid hemiplegia affecting right nondominant side: Secondary | ICD-10-CM | POA: Diagnosis not present

## 2022-06-12 DIAGNOSIS — I69320 Aphasia following cerebral infarction: Secondary | ICD-10-CM | POA: Diagnosis not present

## 2022-06-13 DIAGNOSIS — I63322 Cerebral infarction due to thrombosis of left anterior cerebral artery: Secondary | ICD-10-CM | POA: Diagnosis not present

## 2022-06-13 DIAGNOSIS — M24521 Contracture, right elbow: Secondary | ICD-10-CM | POA: Diagnosis not present

## 2022-06-13 DIAGNOSIS — I69320 Aphasia following cerebral infarction: Secondary | ICD-10-CM | POA: Diagnosis not present

## 2022-06-13 DIAGNOSIS — G8103 Flaccid hemiplegia affecting right nondominant side: Secondary | ICD-10-CM | POA: Diagnosis not present

## 2022-06-13 DIAGNOSIS — I69891 Dysphagia following other cerebrovascular disease: Secondary | ICD-10-CM | POA: Diagnosis not present

## 2022-06-13 DIAGNOSIS — G20C Parkinsonism, unspecified: Secondary | ICD-10-CM | POA: Diagnosis not present

## 2022-06-16 DIAGNOSIS — R4701 Aphasia: Secondary | ICD-10-CM | POA: Diagnosis not present

## 2022-06-16 DIAGNOSIS — I63322 Cerebral infarction due to thrombosis of left anterior cerebral artery: Secondary | ICD-10-CM | POA: Diagnosis not present

## 2022-06-16 DIAGNOSIS — M24521 Contracture, right elbow: Secondary | ICD-10-CM | POA: Diagnosis not present

## 2022-06-16 DIAGNOSIS — I69891 Dysphagia following other cerebrovascular disease: Secondary | ICD-10-CM | POA: Diagnosis not present

## 2022-06-16 DIAGNOSIS — G8193 Hemiplegia, unspecified affecting right nondominant side: Secondary | ICD-10-CM | POA: Diagnosis not present

## 2022-06-16 DIAGNOSIS — N183 Chronic kidney disease, stage 3 unspecified: Secondary | ICD-10-CM | POA: Diagnosis not present

## 2022-06-16 DIAGNOSIS — I69320 Aphasia following cerebral infarction: Secondary | ICD-10-CM | POA: Diagnosis not present

## 2022-06-16 DIAGNOSIS — G8103 Flaccid hemiplegia affecting right nondominant side: Secondary | ICD-10-CM | POA: Diagnosis not present

## 2022-06-16 DIAGNOSIS — G20C Parkinsonism, unspecified: Secondary | ICD-10-CM | POA: Diagnosis not present

## 2022-06-17 DIAGNOSIS — I63322 Cerebral infarction due to thrombosis of left anterior cerebral artery: Secondary | ICD-10-CM | POA: Diagnosis not present

## 2022-06-17 DIAGNOSIS — G8103 Flaccid hemiplegia affecting right nondominant side: Secondary | ICD-10-CM | POA: Diagnosis not present

## 2022-06-17 DIAGNOSIS — M24521 Contracture, right elbow: Secondary | ICD-10-CM | POA: Diagnosis not present

## 2022-06-17 DIAGNOSIS — I69320 Aphasia following cerebral infarction: Secondary | ICD-10-CM | POA: Diagnosis not present

## 2022-06-17 DIAGNOSIS — G20C Parkinsonism, unspecified: Secondary | ICD-10-CM | POA: Diagnosis not present

## 2022-06-17 DIAGNOSIS — I69891 Dysphagia following other cerebrovascular disease: Secondary | ICD-10-CM | POA: Diagnosis not present

## 2022-06-18 DIAGNOSIS — I69891 Dysphagia following other cerebrovascular disease: Secondary | ICD-10-CM | POA: Diagnosis not present

## 2022-06-18 DIAGNOSIS — G20C Parkinsonism, unspecified: Secondary | ICD-10-CM | POA: Diagnosis not present

## 2022-06-18 DIAGNOSIS — I63322 Cerebral infarction due to thrombosis of left anterior cerebral artery: Secondary | ICD-10-CM | POA: Diagnosis not present

## 2022-06-18 DIAGNOSIS — M24521 Contracture, right elbow: Secondary | ICD-10-CM | POA: Diagnosis not present

## 2022-06-18 DIAGNOSIS — I69320 Aphasia following cerebral infarction: Secondary | ICD-10-CM | POA: Diagnosis not present

## 2022-06-18 DIAGNOSIS — G8103 Flaccid hemiplegia affecting right nondominant side: Secondary | ICD-10-CM | POA: Diagnosis not present

## 2022-06-19 DIAGNOSIS — M24521 Contracture, right elbow: Secondary | ICD-10-CM | POA: Diagnosis not present

## 2022-06-19 DIAGNOSIS — G8103 Flaccid hemiplegia affecting right nondominant side: Secondary | ICD-10-CM | POA: Diagnosis not present

## 2022-06-19 DIAGNOSIS — I69320 Aphasia following cerebral infarction: Secondary | ICD-10-CM | POA: Diagnosis not present

## 2022-06-19 DIAGNOSIS — I63322 Cerebral infarction due to thrombosis of left anterior cerebral artery: Secondary | ICD-10-CM | POA: Diagnosis not present

## 2022-06-19 DIAGNOSIS — G20C Parkinsonism, unspecified: Secondary | ICD-10-CM | POA: Diagnosis not present

## 2022-06-19 DIAGNOSIS — I69891 Dysphagia following other cerebrovascular disease: Secondary | ICD-10-CM | POA: Diagnosis not present

## 2022-06-20 DIAGNOSIS — I69891 Dysphagia following other cerebrovascular disease: Secondary | ICD-10-CM | POA: Diagnosis not present

## 2022-06-20 DIAGNOSIS — I69320 Aphasia following cerebral infarction: Secondary | ICD-10-CM | POA: Diagnosis not present

## 2022-06-20 DIAGNOSIS — G20C Parkinsonism, unspecified: Secondary | ICD-10-CM | POA: Diagnosis not present

## 2022-06-20 DIAGNOSIS — G8103 Flaccid hemiplegia affecting right nondominant side: Secondary | ICD-10-CM | POA: Diagnosis not present

## 2022-06-20 DIAGNOSIS — M24521 Contracture, right elbow: Secondary | ICD-10-CM | POA: Diagnosis not present

## 2022-06-20 DIAGNOSIS — I63322 Cerebral infarction due to thrombosis of left anterior cerebral artery: Secondary | ICD-10-CM | POA: Diagnosis not present

## 2022-06-23 DIAGNOSIS — M24521 Contracture, right elbow: Secondary | ICD-10-CM | POA: Diagnosis not present

## 2022-06-23 DIAGNOSIS — G8103 Flaccid hemiplegia affecting right nondominant side: Secondary | ICD-10-CM | POA: Diagnosis not present

## 2022-06-23 DIAGNOSIS — G20C Parkinsonism, unspecified: Secondary | ICD-10-CM | POA: Diagnosis not present

## 2022-06-23 DIAGNOSIS — I69320 Aphasia following cerebral infarction: Secondary | ICD-10-CM | POA: Diagnosis not present

## 2022-06-23 DIAGNOSIS — I63322 Cerebral infarction due to thrombosis of left anterior cerebral artery: Secondary | ICD-10-CM | POA: Diagnosis not present

## 2022-06-23 DIAGNOSIS — I69891 Dysphagia following other cerebrovascular disease: Secondary | ICD-10-CM | POA: Diagnosis not present

## 2022-06-24 DIAGNOSIS — M24521 Contracture, right elbow: Secondary | ICD-10-CM | POA: Diagnosis not present

## 2022-06-24 DIAGNOSIS — G20C Parkinsonism, unspecified: Secondary | ICD-10-CM | POA: Diagnosis not present

## 2022-06-24 DIAGNOSIS — I69891 Dysphagia following other cerebrovascular disease: Secondary | ICD-10-CM | POA: Diagnosis not present

## 2022-06-24 DIAGNOSIS — I69320 Aphasia following cerebral infarction: Secondary | ICD-10-CM | POA: Diagnosis not present

## 2022-06-24 DIAGNOSIS — G8103 Flaccid hemiplegia affecting right nondominant side: Secondary | ICD-10-CM | POA: Diagnosis not present

## 2022-06-24 DIAGNOSIS — I63322 Cerebral infarction due to thrombosis of left anterior cerebral artery: Secondary | ICD-10-CM | POA: Diagnosis not present

## 2022-06-25 DIAGNOSIS — I63322 Cerebral infarction due to thrombosis of left anterior cerebral artery: Secondary | ICD-10-CM | POA: Diagnosis not present

## 2022-06-25 DIAGNOSIS — I69891 Dysphagia following other cerebrovascular disease: Secondary | ICD-10-CM | POA: Diagnosis not present

## 2022-06-25 DIAGNOSIS — G20C Parkinsonism, unspecified: Secondary | ICD-10-CM | POA: Diagnosis not present

## 2022-06-25 DIAGNOSIS — I69320 Aphasia following cerebral infarction: Secondary | ICD-10-CM | POA: Diagnosis not present

## 2022-06-25 DIAGNOSIS — M24521 Contracture, right elbow: Secondary | ICD-10-CM | POA: Diagnosis not present

## 2022-06-25 DIAGNOSIS — G8103 Flaccid hemiplegia affecting right nondominant side: Secondary | ICD-10-CM | POA: Diagnosis not present

## 2022-06-26 DIAGNOSIS — I69891 Dysphagia following other cerebrovascular disease: Secondary | ICD-10-CM | POA: Diagnosis not present

## 2022-06-26 DIAGNOSIS — I63322 Cerebral infarction due to thrombosis of left anterior cerebral artery: Secondary | ICD-10-CM | POA: Diagnosis not present

## 2022-06-26 DIAGNOSIS — G20C Parkinsonism, unspecified: Secondary | ICD-10-CM | POA: Diagnosis not present

## 2022-06-26 DIAGNOSIS — M24521 Contracture, right elbow: Secondary | ICD-10-CM | POA: Diagnosis not present

## 2022-06-26 DIAGNOSIS — I69320 Aphasia following cerebral infarction: Secondary | ICD-10-CM | POA: Diagnosis not present

## 2022-06-26 DIAGNOSIS — G8103 Flaccid hemiplegia affecting right nondominant side: Secondary | ICD-10-CM | POA: Diagnosis not present

## 2022-06-27 DIAGNOSIS — G8103 Flaccid hemiplegia affecting right nondominant side: Secondary | ICD-10-CM | POA: Diagnosis not present

## 2022-06-27 DIAGNOSIS — I69320 Aphasia following cerebral infarction: Secondary | ICD-10-CM | POA: Diagnosis not present

## 2022-06-27 DIAGNOSIS — M24521 Contracture, right elbow: Secondary | ICD-10-CM | POA: Diagnosis not present

## 2022-06-27 DIAGNOSIS — G20C Parkinsonism, unspecified: Secondary | ICD-10-CM | POA: Diagnosis not present

## 2022-06-27 DIAGNOSIS — I69891 Dysphagia following other cerebrovascular disease: Secondary | ICD-10-CM | POA: Diagnosis not present

## 2022-06-27 DIAGNOSIS — I63322 Cerebral infarction due to thrombosis of left anterior cerebral artery: Secondary | ICD-10-CM | POA: Diagnosis not present

## 2022-06-30 DIAGNOSIS — I69320 Aphasia following cerebral infarction: Secondary | ICD-10-CM | POA: Diagnosis not present

## 2022-06-30 DIAGNOSIS — G8103 Flaccid hemiplegia affecting right nondominant side: Secondary | ICD-10-CM | POA: Diagnosis not present

## 2022-06-30 DIAGNOSIS — M24521 Contracture, right elbow: Secondary | ICD-10-CM | POA: Diagnosis not present

## 2022-06-30 DIAGNOSIS — I69891 Dysphagia following other cerebrovascular disease: Secondary | ICD-10-CM | POA: Diagnosis not present

## 2022-06-30 DIAGNOSIS — G20C Parkinsonism, unspecified: Secondary | ICD-10-CM | POA: Diagnosis not present

## 2022-06-30 DIAGNOSIS — I63322 Cerebral infarction due to thrombosis of left anterior cerebral artery: Secondary | ICD-10-CM | POA: Diagnosis not present

## 2022-07-01 DIAGNOSIS — M24521 Contracture, right elbow: Secondary | ICD-10-CM | POA: Diagnosis not present

## 2022-07-01 DIAGNOSIS — G20C Parkinsonism, unspecified: Secondary | ICD-10-CM | POA: Diagnosis not present

## 2022-07-01 DIAGNOSIS — I69891 Dysphagia following other cerebrovascular disease: Secondary | ICD-10-CM | POA: Diagnosis not present

## 2022-07-01 DIAGNOSIS — I69320 Aphasia following cerebral infarction: Secondary | ICD-10-CM | POA: Diagnosis not present

## 2022-07-01 DIAGNOSIS — I63322 Cerebral infarction due to thrombosis of left anterior cerebral artery: Secondary | ICD-10-CM | POA: Diagnosis not present

## 2022-07-01 DIAGNOSIS — G8103 Flaccid hemiplegia affecting right nondominant side: Secondary | ICD-10-CM | POA: Diagnosis not present

## 2022-07-02 DIAGNOSIS — G8103 Flaccid hemiplegia affecting right nondominant side: Secondary | ICD-10-CM | POA: Diagnosis not present

## 2022-07-02 DIAGNOSIS — I63322 Cerebral infarction due to thrombosis of left anterior cerebral artery: Secondary | ICD-10-CM | POA: Diagnosis not present

## 2022-07-02 DIAGNOSIS — M24521 Contracture, right elbow: Secondary | ICD-10-CM | POA: Diagnosis not present

## 2022-07-02 DIAGNOSIS — I69320 Aphasia following cerebral infarction: Secondary | ICD-10-CM | POA: Diagnosis not present

## 2022-07-02 DIAGNOSIS — I69891 Dysphagia following other cerebrovascular disease: Secondary | ICD-10-CM | POA: Diagnosis not present

## 2022-07-02 DIAGNOSIS — G20C Parkinsonism, unspecified: Secondary | ICD-10-CM | POA: Diagnosis not present

## 2022-07-03 DIAGNOSIS — M24521 Contracture, right elbow: Secondary | ICD-10-CM | POA: Diagnosis not present

## 2022-07-03 DIAGNOSIS — I69320 Aphasia following cerebral infarction: Secondary | ICD-10-CM | POA: Diagnosis not present

## 2022-07-03 DIAGNOSIS — G8103 Flaccid hemiplegia affecting right nondominant side: Secondary | ICD-10-CM | POA: Diagnosis not present

## 2022-07-03 DIAGNOSIS — G20C Parkinsonism, unspecified: Secondary | ICD-10-CM | POA: Diagnosis not present

## 2022-07-03 DIAGNOSIS — I69891 Dysphagia following other cerebrovascular disease: Secondary | ICD-10-CM | POA: Diagnosis not present

## 2022-07-03 DIAGNOSIS — I63322 Cerebral infarction due to thrombosis of left anterior cerebral artery: Secondary | ICD-10-CM | POA: Diagnosis not present

## 2022-07-04 DIAGNOSIS — I69891 Dysphagia following other cerebrovascular disease: Secondary | ICD-10-CM | POA: Diagnosis not present

## 2022-07-04 DIAGNOSIS — I63322 Cerebral infarction due to thrombosis of left anterior cerebral artery: Secondary | ICD-10-CM | POA: Diagnosis not present

## 2022-07-04 DIAGNOSIS — I69320 Aphasia following cerebral infarction: Secondary | ICD-10-CM | POA: Diagnosis not present

## 2022-07-04 DIAGNOSIS — M24521 Contracture, right elbow: Secondary | ICD-10-CM | POA: Diagnosis not present

## 2022-07-04 DIAGNOSIS — G8103 Flaccid hemiplegia affecting right nondominant side: Secondary | ICD-10-CM | POA: Diagnosis not present

## 2022-07-04 DIAGNOSIS — G20C Parkinsonism, unspecified: Secondary | ICD-10-CM | POA: Diagnosis not present

## 2022-07-07 DIAGNOSIS — M24521 Contracture, right elbow: Secondary | ICD-10-CM | POA: Diagnosis not present

## 2022-07-07 DIAGNOSIS — I69320 Aphasia following cerebral infarction: Secondary | ICD-10-CM | POA: Diagnosis not present

## 2022-07-07 DIAGNOSIS — I63322 Cerebral infarction due to thrombosis of left anterior cerebral artery: Secondary | ICD-10-CM | POA: Diagnosis not present

## 2022-07-07 DIAGNOSIS — I69891 Dysphagia following other cerebrovascular disease: Secondary | ICD-10-CM | POA: Diagnosis not present

## 2022-07-07 DIAGNOSIS — G20C Parkinsonism, unspecified: Secondary | ICD-10-CM | POA: Diagnosis not present

## 2022-07-07 DIAGNOSIS — G8103 Flaccid hemiplegia affecting right nondominant side: Secondary | ICD-10-CM | POA: Diagnosis not present

## 2022-07-08 DIAGNOSIS — G8103 Flaccid hemiplegia affecting right nondominant side: Secondary | ICD-10-CM | POA: Diagnosis not present

## 2022-07-08 DIAGNOSIS — G20C Parkinsonism, unspecified: Secondary | ICD-10-CM | POA: Diagnosis not present

## 2022-07-08 DIAGNOSIS — M24521 Contracture, right elbow: Secondary | ICD-10-CM | POA: Diagnosis not present

## 2022-07-08 DIAGNOSIS — I63322 Cerebral infarction due to thrombosis of left anterior cerebral artery: Secondary | ICD-10-CM | POA: Diagnosis not present

## 2022-07-08 DIAGNOSIS — I69891 Dysphagia following other cerebrovascular disease: Secondary | ICD-10-CM | POA: Diagnosis not present

## 2022-07-08 DIAGNOSIS — I69320 Aphasia following cerebral infarction: Secondary | ICD-10-CM | POA: Diagnosis not present

## 2022-07-09 DIAGNOSIS — I69891 Dysphagia following other cerebrovascular disease: Secondary | ICD-10-CM | POA: Diagnosis not present

## 2022-07-09 DIAGNOSIS — G8103 Flaccid hemiplegia affecting right nondominant side: Secondary | ICD-10-CM | POA: Diagnosis not present

## 2022-07-09 DIAGNOSIS — M24521 Contracture, right elbow: Secondary | ICD-10-CM | POA: Diagnosis not present

## 2022-07-09 DIAGNOSIS — I63322 Cerebral infarction due to thrombosis of left anterior cerebral artery: Secondary | ICD-10-CM | POA: Diagnosis not present

## 2022-07-09 DIAGNOSIS — I69320 Aphasia following cerebral infarction: Secondary | ICD-10-CM | POA: Diagnosis not present

## 2022-07-09 DIAGNOSIS — G20C Parkinsonism, unspecified: Secondary | ICD-10-CM | POA: Diagnosis not present

## 2022-07-10 DIAGNOSIS — G20C Parkinsonism, unspecified: Secondary | ICD-10-CM | POA: Diagnosis not present

## 2022-07-10 DIAGNOSIS — G8103 Flaccid hemiplegia affecting right nondominant side: Secondary | ICD-10-CM | POA: Diagnosis not present

## 2022-07-10 DIAGNOSIS — I69891 Dysphagia following other cerebrovascular disease: Secondary | ICD-10-CM | POA: Diagnosis not present

## 2022-07-10 DIAGNOSIS — E119 Type 2 diabetes mellitus without complications: Secondary | ICD-10-CM | POA: Diagnosis not present

## 2022-07-10 DIAGNOSIS — I63322 Cerebral infarction due to thrombosis of left anterior cerebral artery: Secondary | ICD-10-CM | POA: Diagnosis not present

## 2022-07-10 DIAGNOSIS — I69320 Aphasia following cerebral infarction: Secondary | ICD-10-CM | POA: Diagnosis not present

## 2022-07-10 DIAGNOSIS — Z79899 Other long term (current) drug therapy: Secondary | ICD-10-CM | POA: Diagnosis not present

## 2022-07-10 DIAGNOSIS — M24521 Contracture, right elbow: Secondary | ICD-10-CM | POA: Diagnosis not present

## 2022-07-10 DIAGNOSIS — R945 Abnormal results of liver function studies: Secondary | ICD-10-CM | POA: Diagnosis not present

## 2022-07-11 DIAGNOSIS — M24521 Contracture, right elbow: Secondary | ICD-10-CM | POA: Diagnosis not present

## 2022-07-11 DIAGNOSIS — G8103 Flaccid hemiplegia affecting right nondominant side: Secondary | ICD-10-CM | POA: Diagnosis not present

## 2022-07-11 DIAGNOSIS — I69891 Dysphagia following other cerebrovascular disease: Secondary | ICD-10-CM | POA: Diagnosis not present

## 2022-07-11 DIAGNOSIS — I69320 Aphasia following cerebral infarction: Secondary | ICD-10-CM | POA: Diagnosis not present

## 2022-07-11 DIAGNOSIS — I63322 Cerebral infarction due to thrombosis of left anterior cerebral artery: Secondary | ICD-10-CM | POA: Diagnosis not present

## 2022-07-11 DIAGNOSIS — G20C Parkinsonism, unspecified: Secondary | ICD-10-CM | POA: Diagnosis not present

## 2022-07-14 DIAGNOSIS — I69891 Dysphagia following other cerebrovascular disease: Secondary | ICD-10-CM | POA: Diagnosis not present

## 2022-07-14 DIAGNOSIS — I63322 Cerebral infarction due to thrombosis of left anterior cerebral artery: Secondary | ICD-10-CM | POA: Diagnosis not present

## 2022-07-14 DIAGNOSIS — Z736 Limitation of activities due to disability: Secondary | ICD-10-CM | POA: Diagnosis not present

## 2022-07-14 DIAGNOSIS — R627 Adult failure to thrive: Secondary | ICD-10-CM | POA: Diagnosis not present

## 2022-07-14 DIAGNOSIS — E785 Hyperlipidemia, unspecified: Secondary | ICD-10-CM | POA: Diagnosis not present

## 2022-07-14 DIAGNOSIS — R1319 Other dysphagia: Secondary | ICD-10-CM | POA: Diagnosis not present

## 2022-07-14 DIAGNOSIS — E46 Unspecified protein-calorie malnutrition: Secondary | ICD-10-CM | POA: Diagnosis not present

## 2022-07-14 DIAGNOSIS — Z7409 Other reduced mobility: Secondary | ICD-10-CM | POA: Diagnosis not present

## 2022-07-14 DIAGNOSIS — K219 Gastro-esophageal reflux disease without esophagitis: Secondary | ICD-10-CM | POA: Diagnosis not present

## 2022-07-14 DIAGNOSIS — R4701 Aphasia: Secondary | ICD-10-CM | POA: Diagnosis not present

## 2022-07-14 DIAGNOSIS — N390 Urinary tract infection, site not specified: Secondary | ICD-10-CM | POA: Diagnosis not present

## 2022-07-14 DIAGNOSIS — N183 Chronic kidney disease, stage 3 unspecified: Secondary | ICD-10-CM | POA: Diagnosis not present

## 2022-07-14 DIAGNOSIS — I69918 Other symptoms and signs involving cognitive functions following unspecified cerebrovascular disease: Secondary | ICD-10-CM | POA: Diagnosis not present

## 2022-07-14 DIAGNOSIS — E559 Vitamin D deficiency, unspecified: Secondary | ICD-10-CM | POA: Diagnosis not present

## 2022-07-14 DIAGNOSIS — I69320 Aphasia following cerebral infarction: Secondary | ICD-10-CM | POA: Diagnosis not present

## 2022-07-14 DIAGNOSIS — G8193 Hemiplegia, unspecified affecting right nondominant side: Secondary | ICD-10-CM | POA: Diagnosis not present

## 2022-07-14 DIAGNOSIS — M6259 Muscle wasting and atrophy, not elsewhere classified, multiple sites: Secondary | ICD-10-CM | POA: Diagnosis not present

## 2022-07-14 DIAGNOSIS — R1310 Dysphagia, unspecified: Secondary | ICD-10-CM | POA: Diagnosis not present

## 2022-07-14 DIAGNOSIS — M8580 Other specified disorders of bone density and structure, unspecified site: Secondary | ICD-10-CM | POA: Diagnosis not present

## 2022-07-14 DIAGNOSIS — M24562 Contracture, left knee: Secondary | ICD-10-CM | POA: Diagnosis not present

## 2022-07-14 DIAGNOSIS — G20C Parkinsonism, unspecified: Secondary | ICD-10-CM | POA: Diagnosis not present

## 2022-07-14 DIAGNOSIS — M170 Bilateral primary osteoarthritis of knee: Secondary | ICD-10-CM | POA: Diagnosis not present

## 2022-07-14 DIAGNOSIS — M24521 Contracture, right elbow: Secondary | ICD-10-CM | POA: Diagnosis not present

## 2022-07-14 DIAGNOSIS — M24561 Contracture, right knee: Secondary | ICD-10-CM | POA: Diagnosis not present

## 2022-07-14 DIAGNOSIS — I10 Essential (primary) hypertension: Secondary | ICD-10-CM | POA: Diagnosis not present

## 2022-07-15 DIAGNOSIS — M24562 Contracture, left knee: Secondary | ICD-10-CM | POA: Diagnosis not present

## 2022-07-15 DIAGNOSIS — G20C Parkinsonism, unspecified: Secondary | ICD-10-CM | POA: Diagnosis not present

## 2022-07-15 DIAGNOSIS — I63322 Cerebral infarction due to thrombosis of left anterior cerebral artery: Secondary | ICD-10-CM | POA: Diagnosis not present

## 2022-07-15 DIAGNOSIS — I69891 Dysphagia following other cerebrovascular disease: Secondary | ICD-10-CM | POA: Diagnosis not present

## 2022-07-15 DIAGNOSIS — I69320 Aphasia following cerebral infarction: Secondary | ICD-10-CM | POA: Diagnosis not present

## 2022-07-15 DIAGNOSIS — G8193 Hemiplegia, unspecified affecting right nondominant side: Secondary | ICD-10-CM | POA: Diagnosis not present

## 2022-07-16 DIAGNOSIS — I69891 Dysphagia following other cerebrovascular disease: Secondary | ICD-10-CM | POA: Diagnosis not present

## 2022-07-16 DIAGNOSIS — G20C Parkinsonism, unspecified: Secondary | ICD-10-CM | POA: Diagnosis not present

## 2022-07-16 DIAGNOSIS — I63322 Cerebral infarction due to thrombosis of left anterior cerebral artery: Secondary | ICD-10-CM | POA: Diagnosis not present

## 2022-07-16 DIAGNOSIS — I69320 Aphasia following cerebral infarction: Secondary | ICD-10-CM | POA: Diagnosis not present

## 2022-07-16 DIAGNOSIS — G8193 Hemiplegia, unspecified affecting right nondominant side: Secondary | ICD-10-CM | POA: Diagnosis not present

## 2022-07-16 DIAGNOSIS — M24562 Contracture, left knee: Secondary | ICD-10-CM | POA: Diagnosis not present

## 2022-07-17 DIAGNOSIS — M24562 Contracture, left knee: Secondary | ICD-10-CM | POA: Diagnosis not present

## 2022-07-17 DIAGNOSIS — I69891 Dysphagia following other cerebrovascular disease: Secondary | ICD-10-CM | POA: Diagnosis not present

## 2022-07-17 DIAGNOSIS — I69320 Aphasia following cerebral infarction: Secondary | ICD-10-CM | POA: Diagnosis not present

## 2022-07-17 DIAGNOSIS — G8193 Hemiplegia, unspecified affecting right nondominant side: Secondary | ICD-10-CM | POA: Diagnosis not present

## 2022-07-17 DIAGNOSIS — I63322 Cerebral infarction due to thrombosis of left anterior cerebral artery: Secondary | ICD-10-CM | POA: Diagnosis not present

## 2022-07-17 DIAGNOSIS — G20C Parkinsonism, unspecified: Secondary | ICD-10-CM | POA: Diagnosis not present

## 2022-07-18 DIAGNOSIS — G8193 Hemiplegia, unspecified affecting right nondominant side: Secondary | ICD-10-CM | POA: Diagnosis not present

## 2022-07-18 DIAGNOSIS — I63322 Cerebral infarction due to thrombosis of left anterior cerebral artery: Secondary | ICD-10-CM | POA: Diagnosis not present

## 2022-07-18 DIAGNOSIS — I69891 Dysphagia following other cerebrovascular disease: Secondary | ICD-10-CM | POA: Diagnosis not present

## 2022-07-18 DIAGNOSIS — I69320 Aphasia following cerebral infarction: Secondary | ICD-10-CM | POA: Diagnosis not present

## 2022-07-18 DIAGNOSIS — M24562 Contracture, left knee: Secondary | ICD-10-CM | POA: Diagnosis not present

## 2022-07-18 DIAGNOSIS — G20C Parkinsonism, unspecified: Secondary | ICD-10-CM | POA: Diagnosis not present

## 2022-07-21 DIAGNOSIS — I63322 Cerebral infarction due to thrombosis of left anterior cerebral artery: Secondary | ICD-10-CM | POA: Diagnosis not present

## 2022-07-21 DIAGNOSIS — I69891 Dysphagia following other cerebrovascular disease: Secondary | ICD-10-CM | POA: Diagnosis not present

## 2022-07-21 DIAGNOSIS — I69320 Aphasia following cerebral infarction: Secondary | ICD-10-CM | POA: Diagnosis not present

## 2022-07-21 DIAGNOSIS — M24562 Contracture, left knee: Secondary | ICD-10-CM | POA: Diagnosis not present

## 2022-07-21 DIAGNOSIS — E785 Hyperlipidemia, unspecified: Secondary | ICD-10-CM | POA: Diagnosis not present

## 2022-07-21 DIAGNOSIS — G8193 Hemiplegia, unspecified affecting right nondominant side: Secondary | ICD-10-CM | POA: Diagnosis not present

## 2022-07-21 DIAGNOSIS — G20C Parkinsonism, unspecified: Secondary | ICD-10-CM | POA: Diagnosis not present

## 2022-07-21 DIAGNOSIS — I10 Essential (primary) hypertension: Secondary | ICD-10-CM | POA: Diagnosis not present

## 2022-07-21 DIAGNOSIS — E46 Unspecified protein-calorie malnutrition: Secondary | ICD-10-CM | POA: Diagnosis not present

## 2022-07-22 DIAGNOSIS — G20C Parkinsonism, unspecified: Secondary | ICD-10-CM | POA: Diagnosis not present

## 2022-07-22 DIAGNOSIS — I69320 Aphasia following cerebral infarction: Secondary | ICD-10-CM | POA: Diagnosis not present

## 2022-07-22 DIAGNOSIS — I63322 Cerebral infarction due to thrombosis of left anterior cerebral artery: Secondary | ICD-10-CM | POA: Diagnosis not present

## 2022-07-22 DIAGNOSIS — M24562 Contracture, left knee: Secondary | ICD-10-CM | POA: Diagnosis not present

## 2022-07-22 DIAGNOSIS — I69891 Dysphagia following other cerebrovascular disease: Secondary | ICD-10-CM | POA: Diagnosis not present

## 2022-07-22 DIAGNOSIS — G8193 Hemiplegia, unspecified affecting right nondominant side: Secondary | ICD-10-CM | POA: Diagnosis not present

## 2022-07-23 DIAGNOSIS — I63322 Cerebral infarction due to thrombosis of left anterior cerebral artery: Secondary | ICD-10-CM | POA: Diagnosis not present

## 2022-07-23 DIAGNOSIS — I69891 Dysphagia following other cerebrovascular disease: Secondary | ICD-10-CM | POA: Diagnosis not present

## 2022-07-23 DIAGNOSIS — G8193 Hemiplegia, unspecified affecting right nondominant side: Secondary | ICD-10-CM | POA: Diagnosis not present

## 2022-07-23 DIAGNOSIS — I69320 Aphasia following cerebral infarction: Secondary | ICD-10-CM | POA: Diagnosis not present

## 2022-07-23 DIAGNOSIS — M24562 Contracture, left knee: Secondary | ICD-10-CM | POA: Diagnosis not present

## 2022-07-23 DIAGNOSIS — G20C Parkinsonism, unspecified: Secondary | ICD-10-CM | POA: Diagnosis not present

## 2022-07-24 DIAGNOSIS — G20C Parkinsonism, unspecified: Secondary | ICD-10-CM | POA: Diagnosis not present

## 2022-07-24 DIAGNOSIS — I69320 Aphasia following cerebral infarction: Secondary | ICD-10-CM | POA: Diagnosis not present

## 2022-07-24 DIAGNOSIS — I69891 Dysphagia following other cerebrovascular disease: Secondary | ICD-10-CM | POA: Diagnosis not present

## 2022-07-24 DIAGNOSIS — G8193 Hemiplegia, unspecified affecting right nondominant side: Secondary | ICD-10-CM | POA: Diagnosis not present

## 2022-07-24 DIAGNOSIS — M24562 Contracture, left knee: Secondary | ICD-10-CM | POA: Diagnosis not present

## 2022-07-24 DIAGNOSIS — I63322 Cerebral infarction due to thrombosis of left anterior cerebral artery: Secondary | ICD-10-CM | POA: Diagnosis not present

## 2022-07-25 DIAGNOSIS — I69320 Aphasia following cerebral infarction: Secondary | ICD-10-CM | POA: Diagnosis not present

## 2022-07-25 DIAGNOSIS — I69891 Dysphagia following other cerebrovascular disease: Secondary | ICD-10-CM | POA: Diagnosis not present

## 2022-07-25 DIAGNOSIS — I63322 Cerebral infarction due to thrombosis of left anterior cerebral artery: Secondary | ICD-10-CM | POA: Diagnosis not present

## 2022-07-25 DIAGNOSIS — G20C Parkinsonism, unspecified: Secondary | ICD-10-CM | POA: Diagnosis not present

## 2022-07-25 DIAGNOSIS — M24562 Contracture, left knee: Secondary | ICD-10-CM | POA: Diagnosis not present

## 2022-07-25 DIAGNOSIS — G8193 Hemiplegia, unspecified affecting right nondominant side: Secondary | ICD-10-CM | POA: Diagnosis not present

## 2022-07-28 DIAGNOSIS — G20C Parkinsonism, unspecified: Secondary | ICD-10-CM | POA: Diagnosis not present

## 2022-07-28 DIAGNOSIS — M24562 Contracture, left knee: Secondary | ICD-10-CM | POA: Diagnosis not present

## 2022-07-28 DIAGNOSIS — I69891 Dysphagia following other cerebrovascular disease: Secondary | ICD-10-CM | POA: Diagnosis not present

## 2022-07-28 DIAGNOSIS — G8193 Hemiplegia, unspecified affecting right nondominant side: Secondary | ICD-10-CM | POA: Diagnosis not present

## 2022-07-28 DIAGNOSIS — I69320 Aphasia following cerebral infarction: Secondary | ICD-10-CM | POA: Diagnosis not present

## 2022-07-28 DIAGNOSIS — I63322 Cerebral infarction due to thrombosis of left anterior cerebral artery: Secondary | ICD-10-CM | POA: Diagnosis not present

## 2022-07-29 DIAGNOSIS — G8193 Hemiplegia, unspecified affecting right nondominant side: Secondary | ICD-10-CM | POA: Diagnosis not present

## 2022-07-29 DIAGNOSIS — I69891 Dysphagia following other cerebrovascular disease: Secondary | ICD-10-CM | POA: Diagnosis not present

## 2022-07-29 DIAGNOSIS — G20C Parkinsonism, unspecified: Secondary | ICD-10-CM | POA: Diagnosis not present

## 2022-07-29 DIAGNOSIS — I69320 Aphasia following cerebral infarction: Secondary | ICD-10-CM | POA: Diagnosis not present

## 2022-07-29 DIAGNOSIS — M24562 Contracture, left knee: Secondary | ICD-10-CM | POA: Diagnosis not present

## 2022-07-29 DIAGNOSIS — I63322 Cerebral infarction due to thrombosis of left anterior cerebral artery: Secondary | ICD-10-CM | POA: Diagnosis not present

## 2022-07-30 DIAGNOSIS — I69320 Aphasia following cerebral infarction: Secondary | ICD-10-CM | POA: Diagnosis not present

## 2022-07-30 DIAGNOSIS — G8193 Hemiplegia, unspecified affecting right nondominant side: Secondary | ICD-10-CM | POA: Diagnosis not present

## 2022-07-30 DIAGNOSIS — G20C Parkinsonism, unspecified: Secondary | ICD-10-CM | POA: Diagnosis not present

## 2022-07-30 DIAGNOSIS — I69891 Dysphagia following other cerebrovascular disease: Secondary | ICD-10-CM | POA: Diagnosis not present

## 2022-07-30 DIAGNOSIS — M24562 Contracture, left knee: Secondary | ICD-10-CM | POA: Diagnosis not present

## 2022-07-30 DIAGNOSIS — I63322 Cerebral infarction due to thrombosis of left anterior cerebral artery: Secondary | ICD-10-CM | POA: Diagnosis not present

## 2022-07-31 DIAGNOSIS — I69320 Aphasia following cerebral infarction: Secondary | ICD-10-CM | POA: Diagnosis not present

## 2022-07-31 DIAGNOSIS — G8193 Hemiplegia, unspecified affecting right nondominant side: Secondary | ICD-10-CM | POA: Diagnosis not present

## 2022-07-31 DIAGNOSIS — G20C Parkinsonism, unspecified: Secondary | ICD-10-CM | POA: Diagnosis not present

## 2022-07-31 DIAGNOSIS — I69891 Dysphagia following other cerebrovascular disease: Secondary | ICD-10-CM | POA: Diagnosis not present

## 2022-07-31 DIAGNOSIS — M24562 Contracture, left knee: Secondary | ICD-10-CM | POA: Diagnosis not present

## 2022-07-31 DIAGNOSIS — I63322 Cerebral infarction due to thrombosis of left anterior cerebral artery: Secondary | ICD-10-CM | POA: Diagnosis not present

## 2022-08-01 DIAGNOSIS — G20C Parkinsonism, unspecified: Secondary | ICD-10-CM | POA: Diagnosis not present

## 2022-08-01 DIAGNOSIS — M24562 Contracture, left knee: Secondary | ICD-10-CM | POA: Diagnosis not present

## 2022-08-01 DIAGNOSIS — I69320 Aphasia following cerebral infarction: Secondary | ICD-10-CM | POA: Diagnosis not present

## 2022-08-01 DIAGNOSIS — I63322 Cerebral infarction due to thrombosis of left anterior cerebral artery: Secondary | ICD-10-CM | POA: Diagnosis not present

## 2022-08-01 DIAGNOSIS — I69891 Dysphagia following other cerebrovascular disease: Secondary | ICD-10-CM | POA: Diagnosis not present

## 2022-08-01 DIAGNOSIS — G8193 Hemiplegia, unspecified affecting right nondominant side: Secondary | ICD-10-CM | POA: Diagnosis not present

## 2022-08-04 DIAGNOSIS — G8193 Hemiplegia, unspecified affecting right nondominant side: Secondary | ICD-10-CM | POA: Diagnosis not present

## 2022-08-04 DIAGNOSIS — I63322 Cerebral infarction due to thrombosis of left anterior cerebral artery: Secondary | ICD-10-CM | POA: Diagnosis not present

## 2022-08-04 DIAGNOSIS — G20C Parkinsonism, unspecified: Secondary | ICD-10-CM | POA: Diagnosis not present

## 2022-08-04 DIAGNOSIS — M24562 Contracture, left knee: Secondary | ICD-10-CM | POA: Diagnosis not present

## 2022-08-04 DIAGNOSIS — I69891 Dysphagia following other cerebrovascular disease: Secondary | ICD-10-CM | POA: Diagnosis not present

## 2022-08-04 DIAGNOSIS — I69320 Aphasia following cerebral infarction: Secondary | ICD-10-CM | POA: Diagnosis not present

## 2022-08-05 DIAGNOSIS — G20C Parkinsonism, unspecified: Secondary | ICD-10-CM | POA: Diagnosis not present

## 2022-08-05 DIAGNOSIS — G8193 Hemiplegia, unspecified affecting right nondominant side: Secondary | ICD-10-CM | POA: Diagnosis not present

## 2022-08-05 DIAGNOSIS — I69891 Dysphagia following other cerebrovascular disease: Secondary | ICD-10-CM | POA: Diagnosis not present

## 2022-08-05 DIAGNOSIS — I63322 Cerebral infarction due to thrombosis of left anterior cerebral artery: Secondary | ICD-10-CM | POA: Diagnosis not present

## 2022-08-05 DIAGNOSIS — M24562 Contracture, left knee: Secondary | ICD-10-CM | POA: Diagnosis not present

## 2022-08-05 DIAGNOSIS — I69320 Aphasia following cerebral infarction: Secondary | ICD-10-CM | POA: Diagnosis not present

## 2022-08-06 DIAGNOSIS — G20C Parkinsonism, unspecified: Secondary | ICD-10-CM | POA: Diagnosis not present

## 2022-08-06 DIAGNOSIS — I69891 Dysphagia following other cerebrovascular disease: Secondary | ICD-10-CM | POA: Diagnosis not present

## 2022-08-06 DIAGNOSIS — I63322 Cerebral infarction due to thrombosis of left anterior cerebral artery: Secondary | ICD-10-CM | POA: Diagnosis not present

## 2022-08-06 DIAGNOSIS — I69320 Aphasia following cerebral infarction: Secondary | ICD-10-CM | POA: Diagnosis not present

## 2022-08-06 DIAGNOSIS — G8193 Hemiplegia, unspecified affecting right nondominant side: Secondary | ICD-10-CM | POA: Diagnosis not present

## 2022-08-06 DIAGNOSIS — M24562 Contracture, left knee: Secondary | ICD-10-CM | POA: Diagnosis not present

## 2022-08-07 DIAGNOSIS — I69891 Dysphagia following other cerebrovascular disease: Secondary | ICD-10-CM | POA: Diagnosis not present

## 2022-08-07 DIAGNOSIS — M24562 Contracture, left knee: Secondary | ICD-10-CM | POA: Diagnosis not present

## 2022-08-07 DIAGNOSIS — G20C Parkinsonism, unspecified: Secondary | ICD-10-CM | POA: Diagnosis not present

## 2022-08-07 DIAGNOSIS — G8193 Hemiplegia, unspecified affecting right nondominant side: Secondary | ICD-10-CM | POA: Diagnosis not present

## 2022-08-07 DIAGNOSIS — I69320 Aphasia following cerebral infarction: Secondary | ICD-10-CM | POA: Diagnosis not present

## 2022-08-07 DIAGNOSIS — I63322 Cerebral infarction due to thrombosis of left anterior cerebral artery: Secondary | ICD-10-CM | POA: Diagnosis not present

## 2022-08-08 DIAGNOSIS — G8193 Hemiplegia, unspecified affecting right nondominant side: Secondary | ICD-10-CM | POA: Diagnosis not present

## 2022-08-08 DIAGNOSIS — G20C Parkinsonism, unspecified: Secondary | ICD-10-CM | POA: Diagnosis not present

## 2022-08-08 DIAGNOSIS — I69891 Dysphagia following other cerebrovascular disease: Secondary | ICD-10-CM | POA: Diagnosis not present

## 2022-08-08 DIAGNOSIS — I69320 Aphasia following cerebral infarction: Secondary | ICD-10-CM | POA: Diagnosis not present

## 2022-08-08 DIAGNOSIS — M24562 Contracture, left knee: Secondary | ICD-10-CM | POA: Diagnosis not present

## 2022-08-08 DIAGNOSIS — I63322 Cerebral infarction due to thrombosis of left anterior cerebral artery: Secondary | ICD-10-CM | POA: Diagnosis not present

## 2022-08-11 DIAGNOSIS — K219 Gastro-esophageal reflux disease without esophagitis: Secondary | ICD-10-CM | POA: Diagnosis not present

## 2022-08-11 DIAGNOSIS — M24521 Contracture, right elbow: Secondary | ICD-10-CM | POA: Diagnosis not present

## 2022-08-11 DIAGNOSIS — Z7409 Other reduced mobility: Secondary | ICD-10-CM | POA: Diagnosis not present

## 2022-08-11 DIAGNOSIS — M6259 Muscle wasting and atrophy, not elsewhere classified, multiple sites: Secondary | ICD-10-CM | POA: Diagnosis not present

## 2022-08-11 DIAGNOSIS — G8193 Hemiplegia, unspecified affecting right nondominant side: Secondary | ICD-10-CM | POA: Diagnosis not present

## 2022-08-11 DIAGNOSIS — E559 Vitamin D deficiency, unspecified: Secondary | ICD-10-CM | POA: Diagnosis not present

## 2022-08-11 DIAGNOSIS — N390 Urinary tract infection, site not specified: Secondary | ICD-10-CM | POA: Diagnosis not present

## 2022-08-11 DIAGNOSIS — E46 Unspecified protein-calorie malnutrition: Secondary | ICD-10-CM | POA: Diagnosis not present

## 2022-08-11 DIAGNOSIS — M24562 Contracture, left knee: Secondary | ICD-10-CM | POA: Diagnosis not present

## 2022-08-11 DIAGNOSIS — M8580 Other specified disorders of bone density and structure, unspecified site: Secondary | ICD-10-CM | POA: Diagnosis not present

## 2022-08-11 DIAGNOSIS — R627 Adult failure to thrive: Secondary | ICD-10-CM | POA: Diagnosis not present

## 2022-08-11 DIAGNOSIS — G20C Parkinsonism, unspecified: Secondary | ICD-10-CM | POA: Diagnosis not present

## 2022-08-11 DIAGNOSIS — I69918 Other symptoms and signs involving cognitive functions following unspecified cerebrovascular disease: Secondary | ICD-10-CM | POA: Diagnosis not present

## 2022-08-11 DIAGNOSIS — I69320 Aphasia following cerebral infarction: Secondary | ICD-10-CM | POA: Diagnosis not present

## 2022-08-11 DIAGNOSIS — R4701 Aphasia: Secondary | ICD-10-CM | POA: Diagnosis not present

## 2022-08-11 DIAGNOSIS — I10 Essential (primary) hypertension: Secondary | ICD-10-CM | POA: Diagnosis not present

## 2022-08-11 DIAGNOSIS — E785 Hyperlipidemia, unspecified: Secondary | ICD-10-CM | POA: Diagnosis not present

## 2022-08-11 DIAGNOSIS — M24561 Contracture, right knee: Secondary | ICD-10-CM | POA: Diagnosis not present

## 2022-08-11 DIAGNOSIS — R1310 Dysphagia, unspecified: Secondary | ICD-10-CM | POA: Diagnosis not present

## 2022-08-11 DIAGNOSIS — M170 Bilateral primary osteoarthritis of knee: Secondary | ICD-10-CM | POA: Diagnosis not present

## 2022-08-11 DIAGNOSIS — I63322 Cerebral infarction due to thrombosis of left anterior cerebral artery: Secondary | ICD-10-CM | POA: Diagnosis not present

## 2022-08-11 DIAGNOSIS — R1319 Other dysphagia: Secondary | ICD-10-CM | POA: Diagnosis not present

## 2022-08-11 DIAGNOSIS — Z736 Limitation of activities due to disability: Secondary | ICD-10-CM | POA: Diagnosis not present

## 2022-08-11 DIAGNOSIS — N183 Chronic kidney disease, stage 3 unspecified: Secondary | ICD-10-CM | POA: Diagnosis not present

## 2022-08-12 DIAGNOSIS — M24562 Contracture, left knee: Secondary | ICD-10-CM | POA: Diagnosis not present

## 2022-08-12 DIAGNOSIS — I69320 Aphasia following cerebral infarction: Secondary | ICD-10-CM | POA: Diagnosis not present

## 2022-08-12 DIAGNOSIS — G8193 Hemiplegia, unspecified affecting right nondominant side: Secondary | ICD-10-CM | POA: Diagnosis not present

## 2022-08-12 DIAGNOSIS — I63322 Cerebral infarction due to thrombosis of left anterior cerebral artery: Secondary | ICD-10-CM | POA: Diagnosis not present

## 2022-08-12 DIAGNOSIS — G20C Parkinsonism, unspecified: Secondary | ICD-10-CM | POA: Diagnosis not present

## 2022-08-12 DIAGNOSIS — R627 Adult failure to thrive: Secondary | ICD-10-CM | POA: Diagnosis not present

## 2022-08-13 DIAGNOSIS — G20C Parkinsonism, unspecified: Secondary | ICD-10-CM | POA: Diagnosis not present

## 2022-08-13 DIAGNOSIS — I63322 Cerebral infarction due to thrombosis of left anterior cerebral artery: Secondary | ICD-10-CM | POA: Diagnosis not present

## 2022-08-13 DIAGNOSIS — G8193 Hemiplegia, unspecified affecting right nondominant side: Secondary | ICD-10-CM | POA: Diagnosis not present

## 2022-08-13 DIAGNOSIS — R627 Adult failure to thrive: Secondary | ICD-10-CM | POA: Diagnosis not present

## 2022-08-13 DIAGNOSIS — M24562 Contracture, left knee: Secondary | ICD-10-CM | POA: Diagnosis not present

## 2022-08-13 DIAGNOSIS — I69320 Aphasia following cerebral infarction: Secondary | ICD-10-CM | POA: Diagnosis not present

## 2022-08-14 DIAGNOSIS — M24562 Contracture, left knee: Secondary | ICD-10-CM | POA: Diagnosis not present

## 2022-08-14 DIAGNOSIS — R627 Adult failure to thrive: Secondary | ICD-10-CM | POA: Diagnosis not present

## 2022-08-14 DIAGNOSIS — G20C Parkinsonism, unspecified: Secondary | ICD-10-CM | POA: Diagnosis not present

## 2022-08-14 DIAGNOSIS — I63322 Cerebral infarction due to thrombosis of left anterior cerebral artery: Secondary | ICD-10-CM | POA: Diagnosis not present

## 2022-08-14 DIAGNOSIS — G8193 Hemiplegia, unspecified affecting right nondominant side: Secondary | ICD-10-CM | POA: Diagnosis not present

## 2022-08-14 DIAGNOSIS — I69320 Aphasia following cerebral infarction: Secondary | ICD-10-CM | POA: Diagnosis not present

## 2022-08-15 DIAGNOSIS — I69320 Aphasia following cerebral infarction: Secondary | ICD-10-CM | POA: Diagnosis not present

## 2022-08-15 DIAGNOSIS — M24562 Contracture, left knee: Secondary | ICD-10-CM | POA: Diagnosis not present

## 2022-08-15 DIAGNOSIS — G8193 Hemiplegia, unspecified affecting right nondominant side: Secondary | ICD-10-CM | POA: Diagnosis not present

## 2022-08-15 DIAGNOSIS — G20C Parkinsonism, unspecified: Secondary | ICD-10-CM | POA: Diagnosis not present

## 2022-08-15 DIAGNOSIS — R627 Adult failure to thrive: Secondary | ICD-10-CM | POA: Diagnosis not present

## 2022-08-15 DIAGNOSIS — I63322 Cerebral infarction due to thrombosis of left anterior cerebral artery: Secondary | ICD-10-CM | POA: Diagnosis not present

## 2022-08-18 DIAGNOSIS — R627 Adult failure to thrive: Secondary | ICD-10-CM | POA: Diagnosis not present

## 2022-08-18 DIAGNOSIS — M24562 Contracture, left knee: Secondary | ICD-10-CM | POA: Diagnosis not present

## 2022-08-18 DIAGNOSIS — I69320 Aphasia following cerebral infarction: Secondary | ICD-10-CM | POA: Diagnosis not present

## 2022-08-18 DIAGNOSIS — G20C Parkinsonism, unspecified: Secondary | ICD-10-CM | POA: Diagnosis not present

## 2022-08-18 DIAGNOSIS — I63322 Cerebral infarction due to thrombosis of left anterior cerebral artery: Secondary | ICD-10-CM | POA: Diagnosis not present

## 2022-08-18 DIAGNOSIS — G8193 Hemiplegia, unspecified affecting right nondominant side: Secondary | ICD-10-CM | POA: Diagnosis not present

## 2022-08-19 DIAGNOSIS — G8193 Hemiplegia, unspecified affecting right nondominant side: Secondary | ICD-10-CM | POA: Diagnosis not present

## 2022-08-19 DIAGNOSIS — R627 Adult failure to thrive: Secondary | ICD-10-CM | POA: Diagnosis not present

## 2022-08-19 DIAGNOSIS — M24562 Contracture, left knee: Secondary | ICD-10-CM | POA: Diagnosis not present

## 2022-08-19 DIAGNOSIS — G20C Parkinsonism, unspecified: Secondary | ICD-10-CM | POA: Diagnosis not present

## 2022-08-19 DIAGNOSIS — I69320 Aphasia following cerebral infarction: Secondary | ICD-10-CM | POA: Diagnosis not present

## 2022-08-19 DIAGNOSIS — I63322 Cerebral infarction due to thrombosis of left anterior cerebral artery: Secondary | ICD-10-CM | POA: Diagnosis not present

## 2022-08-20 DIAGNOSIS — M24562 Contracture, left knee: Secondary | ICD-10-CM | POA: Diagnosis not present

## 2022-08-20 DIAGNOSIS — G20C Parkinsonism, unspecified: Secondary | ICD-10-CM | POA: Diagnosis not present

## 2022-08-20 DIAGNOSIS — I69320 Aphasia following cerebral infarction: Secondary | ICD-10-CM | POA: Diagnosis not present

## 2022-08-20 DIAGNOSIS — R627 Adult failure to thrive: Secondary | ICD-10-CM | POA: Diagnosis not present

## 2022-08-20 DIAGNOSIS — I63322 Cerebral infarction due to thrombosis of left anterior cerebral artery: Secondary | ICD-10-CM | POA: Diagnosis not present

## 2022-08-20 DIAGNOSIS — G8193 Hemiplegia, unspecified affecting right nondominant side: Secondary | ICD-10-CM | POA: Diagnosis not present

## 2022-08-21 DIAGNOSIS — I69320 Aphasia following cerebral infarction: Secondary | ICD-10-CM | POA: Diagnosis not present

## 2022-08-21 DIAGNOSIS — G8193 Hemiplegia, unspecified affecting right nondominant side: Secondary | ICD-10-CM | POA: Diagnosis not present

## 2022-08-21 DIAGNOSIS — M24562 Contracture, left knee: Secondary | ICD-10-CM | POA: Diagnosis not present

## 2022-08-21 DIAGNOSIS — I63322 Cerebral infarction due to thrombosis of left anterior cerebral artery: Secondary | ICD-10-CM | POA: Diagnosis not present

## 2022-08-21 DIAGNOSIS — G20C Parkinsonism, unspecified: Secondary | ICD-10-CM | POA: Diagnosis not present

## 2022-08-21 DIAGNOSIS — R627 Adult failure to thrive: Secondary | ICD-10-CM | POA: Diagnosis not present

## 2022-08-22 DIAGNOSIS — I63322 Cerebral infarction due to thrombosis of left anterior cerebral artery: Secondary | ICD-10-CM | POA: Diagnosis not present

## 2022-08-22 DIAGNOSIS — I69320 Aphasia following cerebral infarction: Secondary | ICD-10-CM | POA: Diagnosis not present

## 2022-08-22 DIAGNOSIS — R627 Adult failure to thrive: Secondary | ICD-10-CM | POA: Diagnosis not present

## 2022-08-22 DIAGNOSIS — G8193 Hemiplegia, unspecified affecting right nondominant side: Secondary | ICD-10-CM | POA: Diagnosis not present

## 2022-08-22 DIAGNOSIS — G20C Parkinsonism, unspecified: Secondary | ICD-10-CM | POA: Diagnosis not present

## 2022-08-22 DIAGNOSIS — M24562 Contracture, left knee: Secondary | ICD-10-CM | POA: Diagnosis not present

## 2022-08-25 DIAGNOSIS — G8193 Hemiplegia, unspecified affecting right nondominant side: Secondary | ICD-10-CM | POA: Diagnosis not present

## 2022-08-25 DIAGNOSIS — R627 Adult failure to thrive: Secondary | ICD-10-CM | POA: Diagnosis not present

## 2022-08-25 DIAGNOSIS — M24562 Contracture, left knee: Secondary | ICD-10-CM | POA: Diagnosis not present

## 2022-08-25 DIAGNOSIS — G20C Parkinsonism, unspecified: Secondary | ICD-10-CM | POA: Diagnosis not present

## 2022-08-25 DIAGNOSIS — I69320 Aphasia following cerebral infarction: Secondary | ICD-10-CM | POA: Diagnosis not present

## 2022-08-25 DIAGNOSIS — I63322 Cerebral infarction due to thrombosis of left anterior cerebral artery: Secondary | ICD-10-CM | POA: Diagnosis not present

## 2022-08-26 DIAGNOSIS — G8193 Hemiplegia, unspecified affecting right nondominant side: Secondary | ICD-10-CM | POA: Diagnosis not present

## 2022-08-26 DIAGNOSIS — G20C Parkinsonism, unspecified: Secondary | ICD-10-CM | POA: Diagnosis not present

## 2022-08-26 DIAGNOSIS — I69320 Aphasia following cerebral infarction: Secondary | ICD-10-CM | POA: Diagnosis not present

## 2022-08-26 DIAGNOSIS — I63322 Cerebral infarction due to thrombosis of left anterior cerebral artery: Secondary | ICD-10-CM | POA: Diagnosis not present

## 2022-08-26 DIAGNOSIS — M1990 Unspecified osteoarthritis, unspecified site: Secondary | ICD-10-CM | POA: Diagnosis not present

## 2022-08-26 DIAGNOSIS — M24562 Contracture, left knee: Secondary | ICD-10-CM | POA: Diagnosis not present

## 2022-08-26 DIAGNOSIS — E46 Unspecified protein-calorie malnutrition: Secondary | ICD-10-CM | POA: Diagnosis not present

## 2022-08-26 DIAGNOSIS — R627 Adult failure to thrive: Secondary | ICD-10-CM | POA: Diagnosis not present

## 2022-08-27 DIAGNOSIS — I63322 Cerebral infarction due to thrombosis of left anterior cerebral artery: Secondary | ICD-10-CM | POA: Diagnosis not present

## 2022-08-27 DIAGNOSIS — I69320 Aphasia following cerebral infarction: Secondary | ICD-10-CM | POA: Diagnosis not present

## 2022-08-27 DIAGNOSIS — G20C Parkinsonism, unspecified: Secondary | ICD-10-CM | POA: Diagnosis not present

## 2022-08-27 DIAGNOSIS — R627 Adult failure to thrive: Secondary | ICD-10-CM | POA: Diagnosis not present

## 2022-08-27 DIAGNOSIS — G8193 Hemiplegia, unspecified affecting right nondominant side: Secondary | ICD-10-CM | POA: Diagnosis not present

## 2022-08-27 DIAGNOSIS — M24562 Contracture, left knee: Secondary | ICD-10-CM | POA: Diagnosis not present

## 2022-08-27 IMAGING — MR MR HEAD W/O CM
8 of 10 series · 35 of 48 positions shown · non-contrast
Comparison: Head CT earlier same day.  MRI 04/19/2009

CLINICAL DATA: Stroke, follow-up. Last seen normal 5 hours before
admission. Mental status changes. Abnormal head CT.



[Series 3: DWI · axial · 3.0mm · 1.09mm/px · z∈[-35,+114]mm · 9 of 102 slices shown (1 of 4)]
[im 1/102]
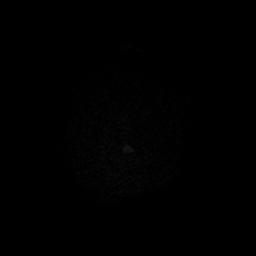
[im 13/102]
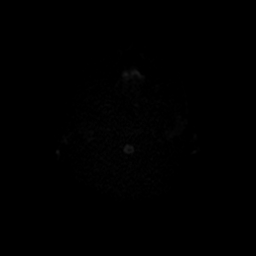
[im 26/102]
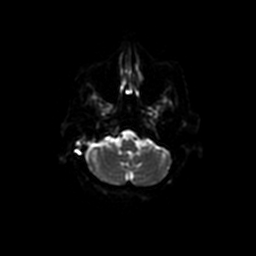
[im 38/102]
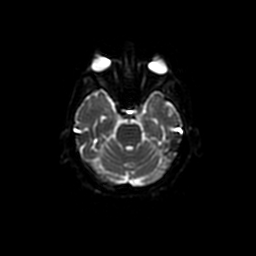
[im 51/102]
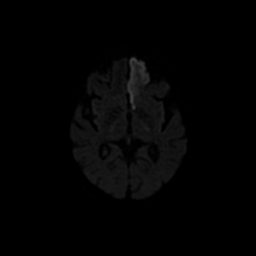
[im 64/102]
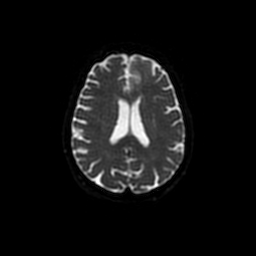
[im 76/102]
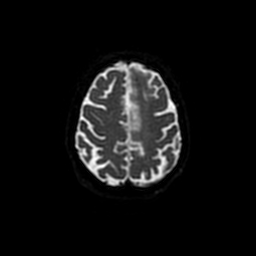
[im 89/102]
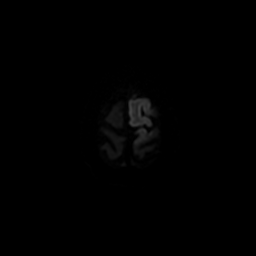
[im 102/102]
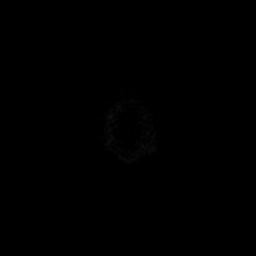

[Series 5: DWI · coronal · 5.0mm · 1.09mm/px · 7 of 72 slices shown (2 of 4)]
[im 1/72]
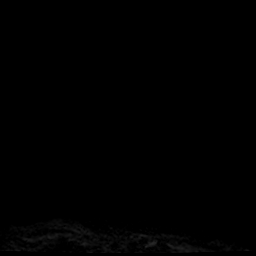
[im 12/72]
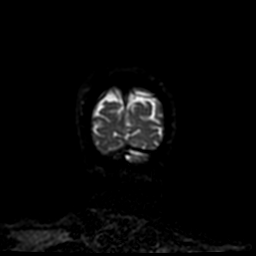
[im 24/72]
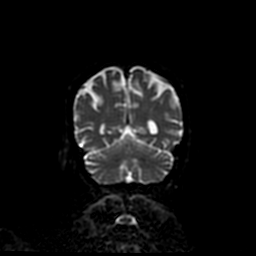
[im 36/72]
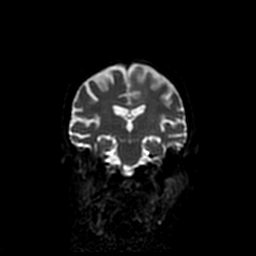
[im 48/72]
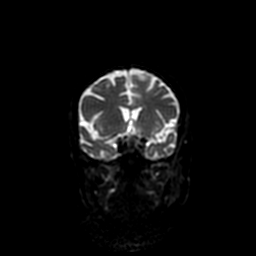
[im 60/72]
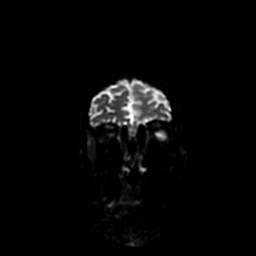
[im 72/72]
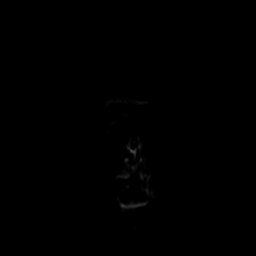

[Series 6: T1 · sagittal · 5.0mm · 0.47mm/px · 1 of 23 slices shown]
[im 1/23]
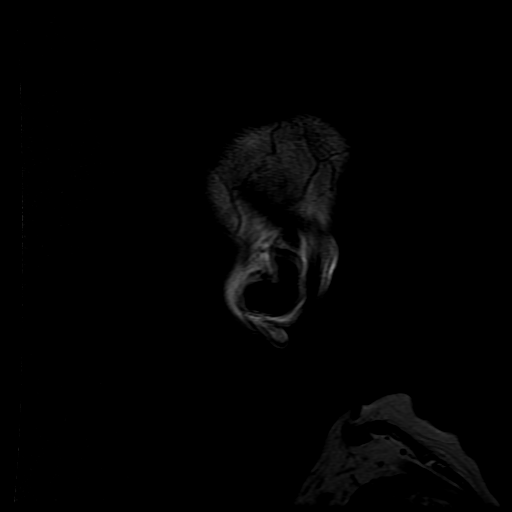

[Series 7: T2 · axial · 5.0mm · 0.43mm/px · z∈[-38,+111]mm · 3 of 26 slices shown (1 of 2)]
[im 1/26]
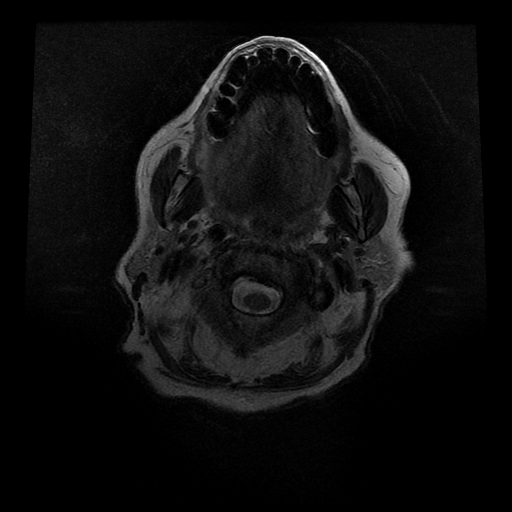
[im 13/26]
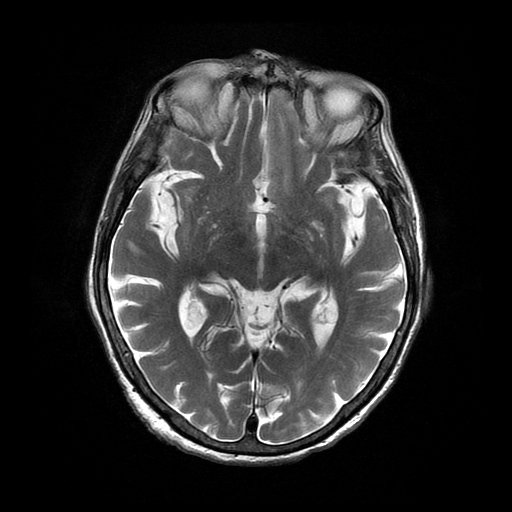
[im 26/26]
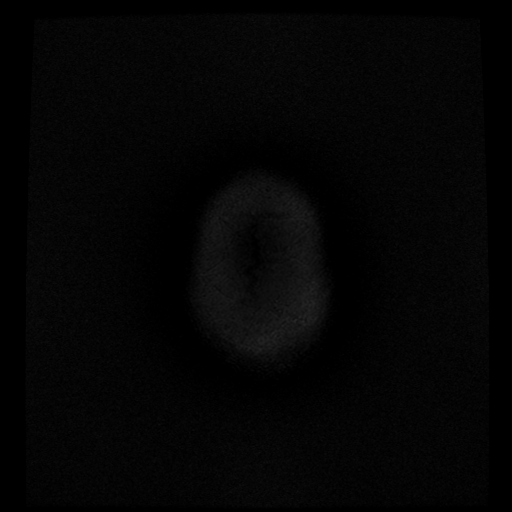

[Series 8: FLAIR · axial · 3.0mm · 0.43mm/px · z∈[-38,+111]mm · 3 of 26 slices shown]
[im 1/26]
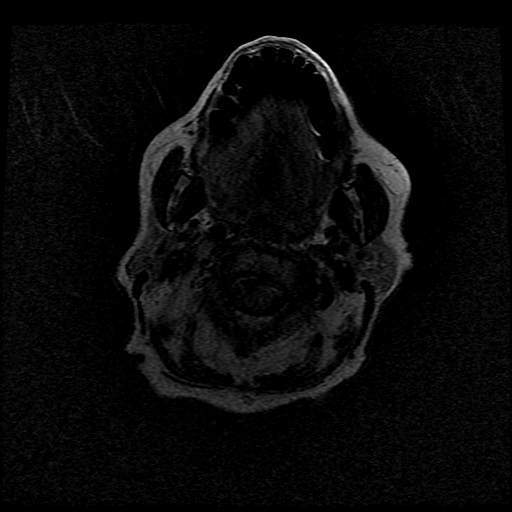
[im 13/26]
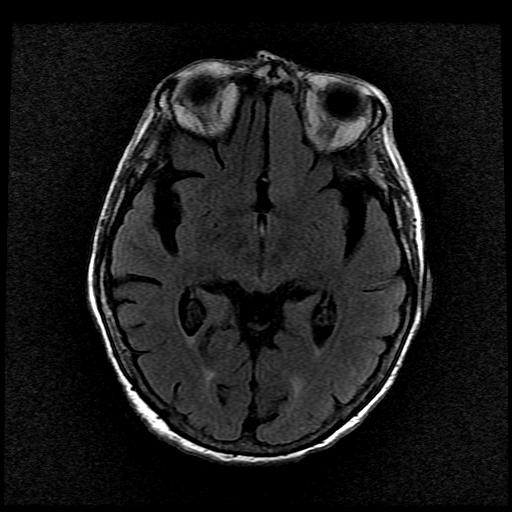
[im 26/26]
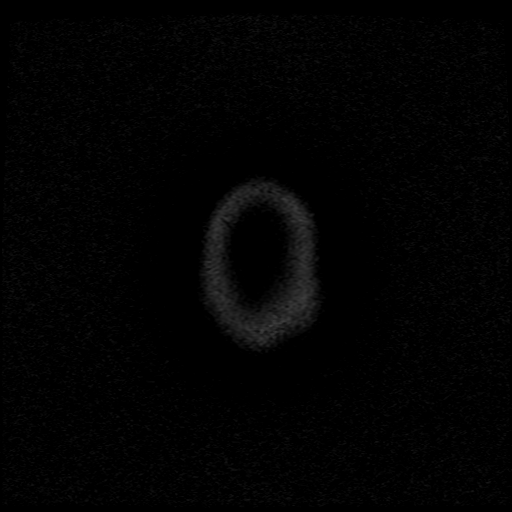

[Series 11: T2 · coronal · 5.0mm · 0.39mm/px · 3 of 28 slices shown (2 of 2)]
[im 1/28]
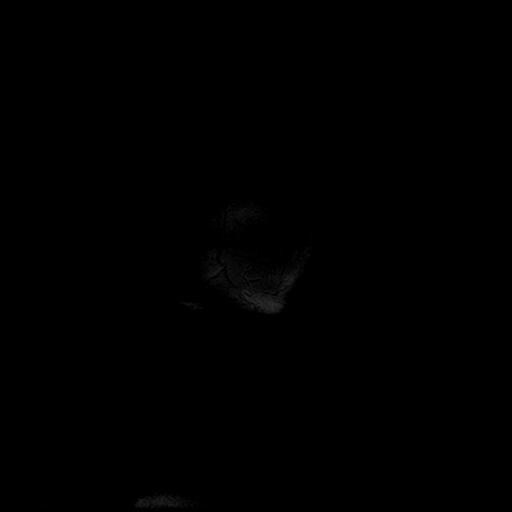
[im 14/28]
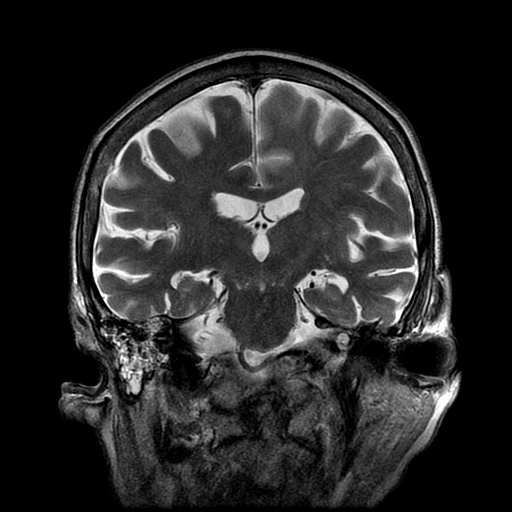
[im 28/28]
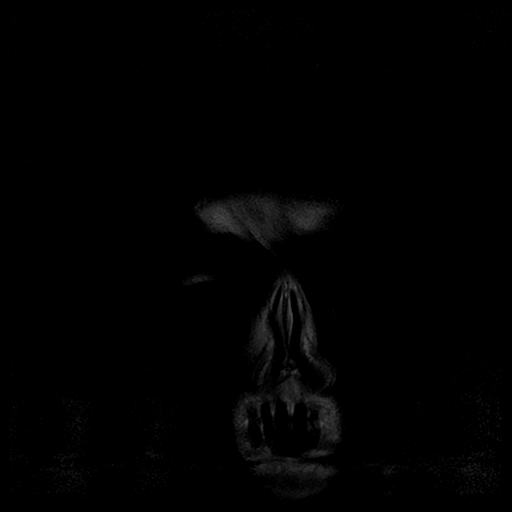

[Series 300: DWI · axial · 3.0mm · 1.09mm/px · z∈[-35,+114]mm · 5 of 51 slices shown (3 of 4)]
[im 1/51]
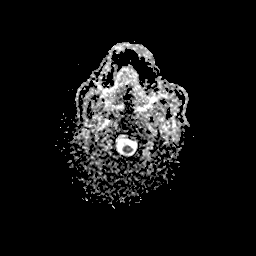
[im 13/51]
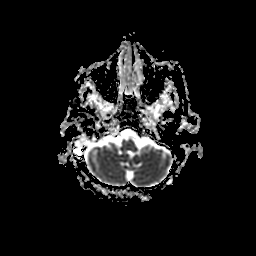
[im 26/51]
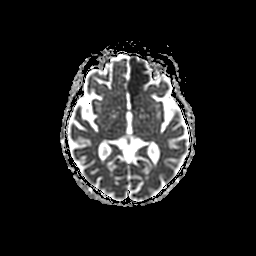
[im 38/51]
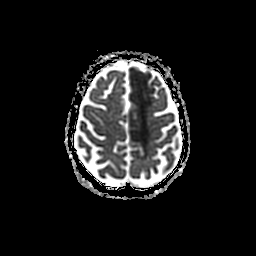
[im 51/51]
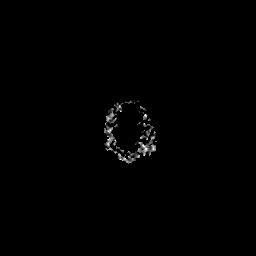

[Series 500: DWI · coronal · 5.0mm · 1.09mm/px · 4 of 36 slices shown (4 of 4)]
[im 1/36]
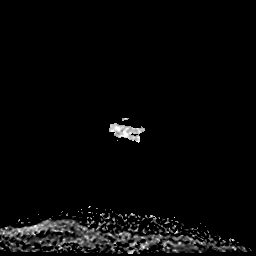
[im 12/36]
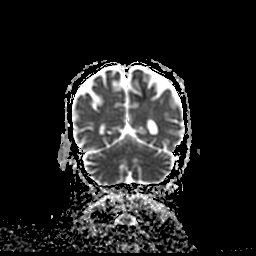
[im 24/36]
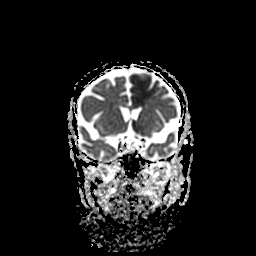
[im 36/36]
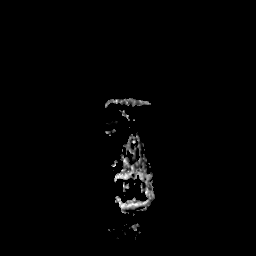

[35 of 48 positions shown; findings below may reference images not displayed]

FINDINGS: MRI HEAD FINDINGS

Brain: Diffusion imaging shows acute infarction of the left frontal
lobe and left genu of the corpus callosum consistent with anterior
cerebral artery distribution infarction. Mild swelling but no
visible hemorrhage. Elsewhere, brainstem and cerebellum are normal.
Cerebral hemispheres are otherwise normal without background
small-vessel disease, extraordinary at this age. No hydrocephalus.
No extra-axial collection.

Vascular: Abnormal flow appearance of the left internal carotid
artery.

Skull and upper cervical spine: Negative

Sinuses/Orbits: Normal

Other: Right mastoid effusion.

MRA HEAD FINDINGS

Right internal carotid artery is patent through the skull base and
siphon regions. Right anterior and middle cerebral vessels are
patent. No flow seen in the left anterior cerebral artery. No
antegrade flow of the ICA through the skull base. Large patent
posterior communicating artery gives supply to the left MCA
territory.

Non dominant right vertebral artery serves PICA and gives a tiny
contribution to the basilar. Dominant left vertebral artery widely
patent to the basilar. No basilar stenosis. Posterior circulation
branch vessels are patent. Patent left PCA as noted.

MRA NECK FINDINGS

Right common carotid artery widely patent to the bifurcation. 20%
stenosis of the internal carotid artery at the bifurcation level but
wide patency beyond that. There is stenosis of the ECA.

Left common carotid artery is occluded at the origin. There is a
patent left external carotid artery.

Both vertebral arteries are patent through the cervical region
showing antegrade flow. The left is dominant.
IMPRESSION: Acute infarction in the left anterior cerebral artery territory.
Mild swelling but no hemorrhage. Otherwise normal appearance of the
brain.

Left internal carotid artery occlusion at its origin. Left external
carotid artery continues to show flow. No flow in the ICA at the
skull base. Blood flow to the left hemisphere occurs through a
patent posterior communicating artery, supplying the left MCA
territory. No flow seen in the left anterior cerebral artery.

## 2022-08-28 DIAGNOSIS — G8193 Hemiplegia, unspecified affecting right nondominant side: Secondary | ICD-10-CM | POA: Diagnosis not present

## 2022-08-28 DIAGNOSIS — I69320 Aphasia following cerebral infarction: Secondary | ICD-10-CM | POA: Diagnosis not present

## 2022-08-28 DIAGNOSIS — G20C Parkinsonism, unspecified: Secondary | ICD-10-CM | POA: Diagnosis not present

## 2022-08-28 DIAGNOSIS — M24562 Contracture, left knee: Secondary | ICD-10-CM | POA: Diagnosis not present

## 2022-08-28 DIAGNOSIS — R627 Adult failure to thrive: Secondary | ICD-10-CM | POA: Diagnosis not present

## 2022-08-28 DIAGNOSIS — I63322 Cerebral infarction due to thrombosis of left anterior cerebral artery: Secondary | ICD-10-CM | POA: Diagnosis not present

## 2022-08-29 DIAGNOSIS — G8193 Hemiplegia, unspecified affecting right nondominant side: Secondary | ICD-10-CM | POA: Diagnosis not present

## 2022-08-29 DIAGNOSIS — R627 Adult failure to thrive: Secondary | ICD-10-CM | POA: Diagnosis not present

## 2022-08-29 DIAGNOSIS — M24562 Contracture, left knee: Secondary | ICD-10-CM | POA: Diagnosis not present

## 2022-08-29 DIAGNOSIS — I63322 Cerebral infarction due to thrombosis of left anterior cerebral artery: Secondary | ICD-10-CM | POA: Diagnosis not present

## 2022-08-29 DIAGNOSIS — I69320 Aphasia following cerebral infarction: Secondary | ICD-10-CM | POA: Diagnosis not present

## 2022-08-29 DIAGNOSIS — G20C Parkinsonism, unspecified: Secondary | ICD-10-CM | POA: Diagnosis not present

## 2022-09-01 DIAGNOSIS — M24562 Contracture, left knee: Secondary | ICD-10-CM | POA: Diagnosis not present

## 2022-09-01 DIAGNOSIS — I63322 Cerebral infarction due to thrombosis of left anterior cerebral artery: Secondary | ICD-10-CM | POA: Diagnosis not present

## 2022-09-01 DIAGNOSIS — R627 Adult failure to thrive: Secondary | ICD-10-CM | POA: Diagnosis not present

## 2022-09-01 DIAGNOSIS — G20C Parkinsonism, unspecified: Secondary | ICD-10-CM | POA: Diagnosis not present

## 2022-09-01 DIAGNOSIS — G8193 Hemiplegia, unspecified affecting right nondominant side: Secondary | ICD-10-CM | POA: Diagnosis not present

## 2022-09-01 DIAGNOSIS — I69320 Aphasia following cerebral infarction: Secondary | ICD-10-CM | POA: Diagnosis not present

## 2022-09-02 DIAGNOSIS — G20C Parkinsonism, unspecified: Secondary | ICD-10-CM | POA: Diagnosis not present

## 2022-09-02 DIAGNOSIS — R627 Adult failure to thrive: Secondary | ICD-10-CM | POA: Diagnosis not present

## 2022-09-02 DIAGNOSIS — I63322 Cerebral infarction due to thrombosis of left anterior cerebral artery: Secondary | ICD-10-CM | POA: Diagnosis not present

## 2022-09-02 DIAGNOSIS — I69320 Aphasia following cerebral infarction: Secondary | ICD-10-CM | POA: Diagnosis not present

## 2022-09-02 DIAGNOSIS — G8193 Hemiplegia, unspecified affecting right nondominant side: Secondary | ICD-10-CM | POA: Diagnosis not present

## 2022-09-02 DIAGNOSIS — M24562 Contracture, left knee: Secondary | ICD-10-CM | POA: Diagnosis not present

## 2022-10-02 DIAGNOSIS — G8193 Hemiplegia, unspecified affecting right nondominant side: Secondary | ICD-10-CM | POA: Diagnosis not present

## 2022-10-02 DIAGNOSIS — G20C Parkinsonism, unspecified: Secondary | ICD-10-CM | POA: Diagnosis not present

## 2022-10-02 DIAGNOSIS — E46 Unspecified protein-calorie malnutrition: Secondary | ICD-10-CM | POA: Diagnosis not present

## 2022-10-02 DIAGNOSIS — R4701 Aphasia: Secondary | ICD-10-CM | POA: Diagnosis not present

## 2022-10-08 DIAGNOSIS — I69320 Aphasia following cerebral infarction: Secondary | ICD-10-CM | POA: Diagnosis not present

## 2022-10-08 DIAGNOSIS — N390 Urinary tract infection, site not specified: Secondary | ICD-10-CM | POA: Diagnosis not present

## 2022-10-08 DIAGNOSIS — G20C Parkinsonism, unspecified: Secondary | ICD-10-CM | POA: Diagnosis not present

## 2022-10-08 DIAGNOSIS — Z7409 Other reduced mobility: Secondary | ICD-10-CM | POA: Diagnosis not present

## 2022-10-08 DIAGNOSIS — R1310 Dysphagia, unspecified: Secondary | ICD-10-CM | POA: Diagnosis not present

## 2022-10-08 DIAGNOSIS — K219 Gastro-esophageal reflux disease without esophagitis: Secondary | ICD-10-CM | POA: Diagnosis not present

## 2022-10-08 DIAGNOSIS — Z736 Limitation of activities due to disability: Secondary | ICD-10-CM | POA: Diagnosis not present

## 2022-10-08 DIAGNOSIS — M24561 Contracture, right knee: Secondary | ICD-10-CM | POA: Diagnosis not present

## 2022-10-08 DIAGNOSIS — R1319 Other dysphagia: Secondary | ICD-10-CM | POA: Diagnosis not present

## 2022-10-08 DIAGNOSIS — I63322 Cerebral infarction due to thrombosis of left anterior cerebral artery: Secondary | ICD-10-CM | POA: Diagnosis not present

## 2022-10-08 DIAGNOSIS — E46 Unspecified protein-calorie malnutrition: Secondary | ICD-10-CM | POA: Diagnosis not present

## 2022-10-08 DIAGNOSIS — N183 Chronic kidney disease, stage 3 unspecified: Secondary | ICD-10-CM | POA: Diagnosis not present

## 2022-10-08 DIAGNOSIS — G8193 Hemiplegia, unspecified affecting right nondominant side: Secondary | ICD-10-CM | POA: Diagnosis not present

## 2022-10-08 DIAGNOSIS — M6259 Muscle wasting and atrophy, not elsewhere classified, multiple sites: Secondary | ICD-10-CM | POA: Diagnosis not present

## 2022-10-08 DIAGNOSIS — E559 Vitamin D deficiency, unspecified: Secondary | ICD-10-CM | POA: Diagnosis not present

## 2022-10-08 DIAGNOSIS — M24521 Contracture, right elbow: Secondary | ICD-10-CM | POA: Diagnosis not present

## 2022-10-08 DIAGNOSIS — I69918 Other symptoms and signs involving cognitive functions following unspecified cerebrovascular disease: Secondary | ICD-10-CM | POA: Diagnosis not present

## 2022-10-08 DIAGNOSIS — I69891 Dysphagia following other cerebrovascular disease: Secondary | ICD-10-CM | POA: Diagnosis not present

## 2022-10-08 DIAGNOSIS — M170 Bilateral primary osteoarthritis of knee: Secondary | ICD-10-CM | POA: Diagnosis not present

## 2022-10-08 DIAGNOSIS — R4701 Aphasia: Secondary | ICD-10-CM | POA: Diagnosis not present

## 2022-10-08 DIAGNOSIS — M8580 Other specified disorders of bone density and structure, unspecified site: Secondary | ICD-10-CM | POA: Diagnosis not present

## 2022-10-08 DIAGNOSIS — E785 Hyperlipidemia, unspecified: Secondary | ICD-10-CM | POA: Diagnosis not present

## 2022-10-08 DIAGNOSIS — I10 Essential (primary) hypertension: Secondary | ICD-10-CM | POA: Diagnosis not present

## 2022-10-08 DIAGNOSIS — R627 Adult failure to thrive: Secondary | ICD-10-CM | POA: Diagnosis not present

## 2022-10-08 DIAGNOSIS — M24562 Contracture, left knee: Secondary | ICD-10-CM | POA: Diagnosis not present

## 2022-10-09 DIAGNOSIS — M24562 Contracture, left knee: Secondary | ICD-10-CM | POA: Diagnosis not present

## 2022-10-09 DIAGNOSIS — I69891 Dysphagia following other cerebrovascular disease: Secondary | ICD-10-CM | POA: Diagnosis not present

## 2022-10-09 DIAGNOSIS — I63322 Cerebral infarction due to thrombosis of left anterior cerebral artery: Secondary | ICD-10-CM | POA: Diagnosis not present

## 2022-10-09 DIAGNOSIS — G20C Parkinsonism, unspecified: Secondary | ICD-10-CM | POA: Diagnosis not present

## 2022-10-09 DIAGNOSIS — G8193 Hemiplegia, unspecified affecting right nondominant side: Secondary | ICD-10-CM | POA: Diagnosis not present

## 2022-10-09 DIAGNOSIS — I69320 Aphasia following cerebral infarction: Secondary | ICD-10-CM | POA: Diagnosis not present

## 2022-10-10 DIAGNOSIS — G8193 Hemiplegia, unspecified affecting right nondominant side: Secondary | ICD-10-CM | POA: Diagnosis not present

## 2022-10-10 DIAGNOSIS — I63322 Cerebral infarction due to thrombosis of left anterior cerebral artery: Secondary | ICD-10-CM | POA: Diagnosis not present

## 2022-10-10 DIAGNOSIS — I69891 Dysphagia following other cerebrovascular disease: Secondary | ICD-10-CM | POA: Diagnosis not present

## 2022-10-10 DIAGNOSIS — I69320 Aphasia following cerebral infarction: Secondary | ICD-10-CM | POA: Diagnosis not present

## 2022-10-10 DIAGNOSIS — G20C Parkinsonism, unspecified: Secondary | ICD-10-CM | POA: Diagnosis not present

## 2022-10-10 DIAGNOSIS — M24562 Contracture, left knee: Secondary | ICD-10-CM | POA: Diagnosis not present

## 2022-10-13 DIAGNOSIS — M8580 Other specified disorders of bone density and structure, unspecified site: Secondary | ICD-10-CM | POA: Diagnosis not present

## 2022-10-13 DIAGNOSIS — M24561 Contracture, right knee: Secondary | ICD-10-CM | POA: Diagnosis not present

## 2022-10-13 DIAGNOSIS — I10 Essential (primary) hypertension: Secondary | ICD-10-CM | POA: Diagnosis not present

## 2022-10-13 DIAGNOSIS — M24562 Contracture, left knee: Secondary | ICD-10-CM | POA: Diagnosis not present

## 2022-10-13 DIAGNOSIS — R4701 Aphasia: Secondary | ICD-10-CM | POA: Diagnosis not present

## 2022-10-13 DIAGNOSIS — M6259 Muscle wasting and atrophy, not elsewhere classified, multiple sites: Secondary | ICD-10-CM | POA: Diagnosis not present

## 2022-10-13 DIAGNOSIS — E559 Vitamin D deficiency, unspecified: Secondary | ICD-10-CM | POA: Diagnosis not present

## 2022-10-13 DIAGNOSIS — G20C Parkinsonism, unspecified: Secondary | ICD-10-CM | POA: Diagnosis not present

## 2022-10-13 DIAGNOSIS — Z7409 Other reduced mobility: Secondary | ICD-10-CM | POA: Diagnosis not present

## 2022-10-13 DIAGNOSIS — M24541 Contracture, right hand: Secondary | ICD-10-CM | POA: Diagnosis not present

## 2022-10-13 DIAGNOSIS — I69320 Aphasia following cerebral infarction: Secondary | ICD-10-CM | POA: Diagnosis not present

## 2022-10-13 DIAGNOSIS — R1310 Dysphagia, unspecified: Secondary | ICD-10-CM | POA: Diagnosis not present

## 2022-10-13 DIAGNOSIS — N183 Chronic kidney disease, stage 3 unspecified: Secondary | ICD-10-CM | POA: Diagnosis not present

## 2022-10-13 DIAGNOSIS — M24521 Contracture, right elbow: Secondary | ICD-10-CM | POA: Diagnosis not present

## 2022-10-13 DIAGNOSIS — R1319 Other dysphagia: Secondary | ICD-10-CM | POA: Diagnosis not present

## 2022-10-13 DIAGNOSIS — M24531 Contracture, right wrist: Secondary | ICD-10-CM | POA: Diagnosis not present

## 2022-10-13 DIAGNOSIS — Z736 Limitation of activities due to disability: Secondary | ICD-10-CM | POA: Diagnosis not present

## 2022-10-13 DIAGNOSIS — I63322 Cerebral infarction due to thrombosis of left anterior cerebral artery: Secondary | ICD-10-CM | POA: Diagnosis not present

## 2022-10-13 DIAGNOSIS — I69918 Other symptoms and signs involving cognitive functions following unspecified cerebrovascular disease: Secondary | ICD-10-CM | POA: Diagnosis not present

## 2022-10-13 DIAGNOSIS — E785 Hyperlipidemia, unspecified: Secondary | ICD-10-CM | POA: Diagnosis not present

## 2022-10-13 DIAGNOSIS — M170 Bilateral primary osteoarthritis of knee: Secondary | ICD-10-CM | POA: Diagnosis not present

## 2022-10-13 DIAGNOSIS — G8193 Hemiplegia, unspecified affecting right nondominant side: Secondary | ICD-10-CM | POA: Diagnosis not present

## 2022-10-13 DIAGNOSIS — K219 Gastro-esophageal reflux disease without esophagitis: Secondary | ICD-10-CM | POA: Diagnosis not present

## 2022-10-13 DIAGNOSIS — I69891 Dysphagia following other cerebrovascular disease: Secondary | ICD-10-CM | POA: Diagnosis not present

## 2022-10-13 DIAGNOSIS — E46 Unspecified protein-calorie malnutrition: Secondary | ICD-10-CM | POA: Diagnosis not present

## 2022-10-14 DIAGNOSIS — I63322 Cerebral infarction due to thrombosis of left anterior cerebral artery: Secondary | ICD-10-CM | POA: Diagnosis not present

## 2022-10-14 DIAGNOSIS — I69320 Aphasia following cerebral infarction: Secondary | ICD-10-CM | POA: Diagnosis not present

## 2022-10-14 DIAGNOSIS — M24562 Contracture, left knee: Secondary | ICD-10-CM | POA: Diagnosis not present

## 2022-10-14 DIAGNOSIS — I69891 Dysphagia following other cerebrovascular disease: Secondary | ICD-10-CM | POA: Diagnosis not present

## 2022-10-14 DIAGNOSIS — G8193 Hemiplegia, unspecified affecting right nondominant side: Secondary | ICD-10-CM | POA: Diagnosis not present

## 2022-10-14 DIAGNOSIS — G20C Parkinsonism, unspecified: Secondary | ICD-10-CM | POA: Diagnosis not present

## 2022-10-15 DIAGNOSIS — G8193 Hemiplegia, unspecified affecting right nondominant side: Secondary | ICD-10-CM | POA: Diagnosis not present

## 2022-10-15 DIAGNOSIS — M24562 Contracture, left knee: Secondary | ICD-10-CM | POA: Diagnosis not present

## 2022-10-15 DIAGNOSIS — I69891 Dysphagia following other cerebrovascular disease: Secondary | ICD-10-CM | POA: Diagnosis not present

## 2022-10-15 DIAGNOSIS — I63322 Cerebral infarction due to thrombosis of left anterior cerebral artery: Secondary | ICD-10-CM | POA: Diagnosis not present

## 2022-10-15 DIAGNOSIS — I69320 Aphasia following cerebral infarction: Secondary | ICD-10-CM | POA: Diagnosis not present

## 2022-10-15 DIAGNOSIS — G20C Parkinsonism, unspecified: Secondary | ICD-10-CM | POA: Diagnosis not present

## 2022-10-16 DIAGNOSIS — I63322 Cerebral infarction due to thrombosis of left anterior cerebral artery: Secondary | ICD-10-CM | POA: Diagnosis not present

## 2022-10-16 DIAGNOSIS — G8193 Hemiplegia, unspecified affecting right nondominant side: Secondary | ICD-10-CM | POA: Diagnosis not present

## 2022-10-16 DIAGNOSIS — M24562 Contracture, left knee: Secondary | ICD-10-CM | POA: Diagnosis not present

## 2022-10-16 DIAGNOSIS — I69891 Dysphagia following other cerebrovascular disease: Secondary | ICD-10-CM | POA: Diagnosis not present

## 2022-10-16 DIAGNOSIS — I69320 Aphasia following cerebral infarction: Secondary | ICD-10-CM | POA: Diagnosis not present

## 2022-10-16 DIAGNOSIS — G20C Parkinsonism, unspecified: Secondary | ICD-10-CM | POA: Diagnosis not present

## 2022-10-17 DIAGNOSIS — I69320 Aphasia following cerebral infarction: Secondary | ICD-10-CM | POA: Diagnosis not present

## 2022-10-17 DIAGNOSIS — I69891 Dysphagia following other cerebrovascular disease: Secondary | ICD-10-CM | POA: Diagnosis not present

## 2022-10-17 DIAGNOSIS — G8193 Hemiplegia, unspecified affecting right nondominant side: Secondary | ICD-10-CM | POA: Diagnosis not present

## 2022-10-17 DIAGNOSIS — G20C Parkinsonism, unspecified: Secondary | ICD-10-CM | POA: Diagnosis not present

## 2022-10-17 DIAGNOSIS — M24562 Contracture, left knee: Secondary | ICD-10-CM | POA: Diagnosis not present

## 2022-10-17 DIAGNOSIS — I63322 Cerebral infarction due to thrombosis of left anterior cerebral artery: Secondary | ICD-10-CM | POA: Diagnosis not present

## 2022-10-20 DIAGNOSIS — I69320 Aphasia following cerebral infarction: Secondary | ICD-10-CM | POA: Diagnosis not present

## 2022-10-20 DIAGNOSIS — I69891 Dysphagia following other cerebrovascular disease: Secondary | ICD-10-CM | POA: Diagnosis not present

## 2022-10-20 DIAGNOSIS — G20C Parkinsonism, unspecified: Secondary | ICD-10-CM | POA: Diagnosis not present

## 2022-10-20 DIAGNOSIS — G8193 Hemiplegia, unspecified affecting right nondominant side: Secondary | ICD-10-CM | POA: Diagnosis not present

## 2022-10-20 DIAGNOSIS — M24562 Contracture, left knee: Secondary | ICD-10-CM | POA: Diagnosis not present

## 2022-10-20 DIAGNOSIS — I63322 Cerebral infarction due to thrombosis of left anterior cerebral artery: Secondary | ICD-10-CM | POA: Diagnosis not present

## 2022-10-21 DIAGNOSIS — M24562 Contracture, left knee: Secondary | ICD-10-CM | POA: Diagnosis not present

## 2022-10-21 DIAGNOSIS — I69320 Aphasia following cerebral infarction: Secondary | ICD-10-CM | POA: Diagnosis not present

## 2022-10-21 DIAGNOSIS — I63322 Cerebral infarction due to thrombosis of left anterior cerebral artery: Secondary | ICD-10-CM | POA: Diagnosis not present

## 2022-10-21 DIAGNOSIS — G20C Parkinsonism, unspecified: Secondary | ICD-10-CM | POA: Diagnosis not present

## 2022-10-21 DIAGNOSIS — G8193 Hemiplegia, unspecified affecting right nondominant side: Secondary | ICD-10-CM | POA: Diagnosis not present

## 2022-10-21 DIAGNOSIS — I69891 Dysphagia following other cerebrovascular disease: Secondary | ICD-10-CM | POA: Diagnosis not present

## 2022-10-22 DIAGNOSIS — G20C Parkinsonism, unspecified: Secondary | ICD-10-CM | POA: Diagnosis not present

## 2022-10-22 DIAGNOSIS — M24562 Contracture, left knee: Secondary | ICD-10-CM | POA: Diagnosis not present

## 2022-10-22 DIAGNOSIS — G8193 Hemiplegia, unspecified affecting right nondominant side: Secondary | ICD-10-CM | POA: Diagnosis not present

## 2022-10-22 DIAGNOSIS — I69320 Aphasia following cerebral infarction: Secondary | ICD-10-CM | POA: Diagnosis not present

## 2022-10-22 DIAGNOSIS — I63322 Cerebral infarction due to thrombosis of left anterior cerebral artery: Secondary | ICD-10-CM | POA: Diagnosis not present

## 2022-10-22 DIAGNOSIS — I69891 Dysphagia following other cerebrovascular disease: Secondary | ICD-10-CM | POA: Diagnosis not present

## 2022-10-23 DIAGNOSIS — I69320 Aphasia following cerebral infarction: Secondary | ICD-10-CM | POA: Diagnosis not present

## 2022-10-23 DIAGNOSIS — M24562 Contracture, left knee: Secondary | ICD-10-CM | POA: Diagnosis not present

## 2022-10-23 DIAGNOSIS — I63322 Cerebral infarction due to thrombosis of left anterior cerebral artery: Secondary | ICD-10-CM | POA: Diagnosis not present

## 2022-10-23 DIAGNOSIS — I69891 Dysphagia following other cerebrovascular disease: Secondary | ICD-10-CM | POA: Diagnosis not present

## 2022-10-23 DIAGNOSIS — G8193 Hemiplegia, unspecified affecting right nondominant side: Secondary | ICD-10-CM | POA: Diagnosis not present

## 2022-10-23 DIAGNOSIS — G20C Parkinsonism, unspecified: Secondary | ICD-10-CM | POA: Diagnosis not present

## 2022-10-24 DIAGNOSIS — I69891 Dysphagia following other cerebrovascular disease: Secondary | ICD-10-CM | POA: Diagnosis not present

## 2022-10-24 DIAGNOSIS — M24562 Contracture, left knee: Secondary | ICD-10-CM | POA: Diagnosis not present

## 2022-10-24 DIAGNOSIS — I69320 Aphasia following cerebral infarction: Secondary | ICD-10-CM | POA: Diagnosis not present

## 2022-10-24 DIAGNOSIS — I63322 Cerebral infarction due to thrombosis of left anterior cerebral artery: Secondary | ICD-10-CM | POA: Diagnosis not present

## 2022-10-24 DIAGNOSIS — G20C Parkinsonism, unspecified: Secondary | ICD-10-CM | POA: Diagnosis not present

## 2022-10-24 DIAGNOSIS — G8193 Hemiplegia, unspecified affecting right nondominant side: Secondary | ICD-10-CM | POA: Diagnosis not present

## 2022-10-27 DIAGNOSIS — I69891 Dysphagia following other cerebrovascular disease: Secondary | ICD-10-CM | POA: Diagnosis not present

## 2022-10-27 DIAGNOSIS — M24562 Contracture, left knee: Secondary | ICD-10-CM | POA: Diagnosis not present

## 2022-10-27 DIAGNOSIS — G20C Parkinsonism, unspecified: Secondary | ICD-10-CM | POA: Diagnosis not present

## 2022-10-27 DIAGNOSIS — I69320 Aphasia following cerebral infarction: Secondary | ICD-10-CM | POA: Diagnosis not present

## 2022-10-27 DIAGNOSIS — I63322 Cerebral infarction due to thrombosis of left anterior cerebral artery: Secondary | ICD-10-CM | POA: Diagnosis not present

## 2022-10-27 DIAGNOSIS — G8193 Hemiplegia, unspecified affecting right nondominant side: Secondary | ICD-10-CM | POA: Diagnosis not present

## 2022-10-28 DIAGNOSIS — M24562 Contracture, left knee: Secondary | ICD-10-CM | POA: Diagnosis not present

## 2022-10-28 DIAGNOSIS — I69891 Dysphagia following other cerebrovascular disease: Secondary | ICD-10-CM | POA: Diagnosis not present

## 2022-10-28 DIAGNOSIS — I69320 Aphasia following cerebral infarction: Secondary | ICD-10-CM | POA: Diagnosis not present

## 2022-10-28 DIAGNOSIS — G8193 Hemiplegia, unspecified affecting right nondominant side: Secondary | ICD-10-CM | POA: Diagnosis not present

## 2022-10-28 DIAGNOSIS — I63322 Cerebral infarction due to thrombosis of left anterior cerebral artery: Secondary | ICD-10-CM | POA: Diagnosis not present

## 2022-10-28 DIAGNOSIS — G20C Parkinsonism, unspecified: Secondary | ICD-10-CM | POA: Diagnosis not present

## 2022-10-29 DIAGNOSIS — M24562 Contracture, left knee: Secondary | ICD-10-CM | POA: Diagnosis not present

## 2022-10-29 DIAGNOSIS — I69891 Dysphagia following other cerebrovascular disease: Secondary | ICD-10-CM | POA: Diagnosis not present

## 2022-10-29 DIAGNOSIS — G20C Parkinsonism, unspecified: Secondary | ICD-10-CM | POA: Diagnosis not present

## 2022-10-29 DIAGNOSIS — G8193 Hemiplegia, unspecified affecting right nondominant side: Secondary | ICD-10-CM | POA: Diagnosis not present

## 2022-10-29 DIAGNOSIS — I69320 Aphasia following cerebral infarction: Secondary | ICD-10-CM | POA: Diagnosis not present

## 2022-10-29 DIAGNOSIS — I63322 Cerebral infarction due to thrombosis of left anterior cerebral artery: Secondary | ICD-10-CM | POA: Diagnosis not present

## 2022-10-30 DIAGNOSIS — I69891 Dysphagia following other cerebrovascular disease: Secondary | ICD-10-CM | POA: Diagnosis not present

## 2022-10-30 DIAGNOSIS — G8193 Hemiplegia, unspecified affecting right nondominant side: Secondary | ICD-10-CM | POA: Diagnosis not present

## 2022-10-30 DIAGNOSIS — G20C Parkinsonism, unspecified: Secondary | ICD-10-CM | POA: Diagnosis not present

## 2022-10-30 DIAGNOSIS — I63322 Cerebral infarction due to thrombosis of left anterior cerebral artery: Secondary | ICD-10-CM | POA: Diagnosis not present

## 2022-10-30 DIAGNOSIS — M24562 Contracture, left knee: Secondary | ICD-10-CM | POA: Diagnosis not present

## 2022-10-30 DIAGNOSIS — I69320 Aphasia following cerebral infarction: Secondary | ICD-10-CM | POA: Diagnosis not present

## 2022-10-31 DIAGNOSIS — I63322 Cerebral infarction due to thrombosis of left anterior cerebral artery: Secondary | ICD-10-CM | POA: Diagnosis not present

## 2022-10-31 DIAGNOSIS — G20C Parkinsonism, unspecified: Secondary | ICD-10-CM | POA: Diagnosis not present

## 2022-10-31 DIAGNOSIS — I69891 Dysphagia following other cerebrovascular disease: Secondary | ICD-10-CM | POA: Diagnosis not present

## 2022-10-31 DIAGNOSIS — G8193 Hemiplegia, unspecified affecting right nondominant side: Secondary | ICD-10-CM | POA: Diagnosis not present

## 2022-10-31 DIAGNOSIS — I69320 Aphasia following cerebral infarction: Secondary | ICD-10-CM | POA: Diagnosis not present

## 2022-10-31 DIAGNOSIS — M24562 Contracture, left knee: Secondary | ICD-10-CM | POA: Diagnosis not present

## 2022-11-03 DIAGNOSIS — G20C Parkinsonism, unspecified: Secondary | ICD-10-CM | POA: Diagnosis not present

## 2022-11-03 DIAGNOSIS — I69891 Dysphagia following other cerebrovascular disease: Secondary | ICD-10-CM | POA: Diagnosis not present

## 2022-11-03 DIAGNOSIS — I63322 Cerebral infarction due to thrombosis of left anterior cerebral artery: Secondary | ICD-10-CM | POA: Diagnosis not present

## 2022-11-03 DIAGNOSIS — G8193 Hemiplegia, unspecified affecting right nondominant side: Secondary | ICD-10-CM | POA: Diagnosis not present

## 2022-11-03 DIAGNOSIS — M24562 Contracture, left knee: Secondary | ICD-10-CM | POA: Diagnosis not present

## 2022-11-03 DIAGNOSIS — I69320 Aphasia following cerebral infarction: Secondary | ICD-10-CM | POA: Diagnosis not present

## 2022-11-04 DIAGNOSIS — G20C Parkinsonism, unspecified: Secondary | ICD-10-CM | POA: Diagnosis not present

## 2022-11-04 DIAGNOSIS — I69320 Aphasia following cerebral infarction: Secondary | ICD-10-CM | POA: Diagnosis not present

## 2022-11-04 DIAGNOSIS — I69891 Dysphagia following other cerebrovascular disease: Secondary | ICD-10-CM | POA: Diagnosis not present

## 2022-11-04 DIAGNOSIS — M24562 Contracture, left knee: Secondary | ICD-10-CM | POA: Diagnosis not present

## 2022-11-04 DIAGNOSIS — G8193 Hemiplegia, unspecified affecting right nondominant side: Secondary | ICD-10-CM | POA: Diagnosis not present

## 2022-11-04 DIAGNOSIS — I63322 Cerebral infarction due to thrombosis of left anterior cerebral artery: Secondary | ICD-10-CM | POA: Diagnosis not present

## 2022-11-05 DIAGNOSIS — M24562 Contracture, left knee: Secondary | ICD-10-CM | POA: Diagnosis not present

## 2022-11-05 DIAGNOSIS — I63322 Cerebral infarction due to thrombosis of left anterior cerebral artery: Secondary | ICD-10-CM | POA: Diagnosis not present

## 2022-11-05 DIAGNOSIS — I69891 Dysphagia following other cerebrovascular disease: Secondary | ICD-10-CM | POA: Diagnosis not present

## 2022-11-05 DIAGNOSIS — G8193 Hemiplegia, unspecified affecting right nondominant side: Secondary | ICD-10-CM | POA: Diagnosis not present

## 2022-11-05 DIAGNOSIS — G20C Parkinsonism, unspecified: Secondary | ICD-10-CM | POA: Diagnosis not present

## 2022-11-05 DIAGNOSIS — I69320 Aphasia following cerebral infarction: Secondary | ICD-10-CM | POA: Diagnosis not present

## 2022-11-06 DIAGNOSIS — I69320 Aphasia following cerebral infarction: Secondary | ICD-10-CM | POA: Diagnosis not present

## 2022-11-06 DIAGNOSIS — I63322 Cerebral infarction due to thrombosis of left anterior cerebral artery: Secondary | ICD-10-CM | POA: Diagnosis not present

## 2022-11-06 DIAGNOSIS — G8193 Hemiplegia, unspecified affecting right nondominant side: Secondary | ICD-10-CM | POA: Diagnosis not present

## 2022-11-06 DIAGNOSIS — I69891 Dysphagia following other cerebrovascular disease: Secondary | ICD-10-CM | POA: Diagnosis not present

## 2022-11-06 DIAGNOSIS — N183 Chronic kidney disease, stage 3 unspecified: Secondary | ICD-10-CM | POA: Diagnosis not present

## 2022-11-06 DIAGNOSIS — M24562 Contracture, left knee: Secondary | ICD-10-CM | POA: Diagnosis not present

## 2022-11-06 DIAGNOSIS — G20C Parkinsonism, unspecified: Secondary | ICD-10-CM | POA: Diagnosis not present

## 2022-11-10 DIAGNOSIS — G20C Parkinsonism, unspecified: Secondary | ICD-10-CM | POA: Diagnosis not present

## 2022-11-10 DIAGNOSIS — M24562 Contracture, left knee: Secondary | ICD-10-CM | POA: Diagnosis not present

## 2022-11-10 DIAGNOSIS — I69320 Aphasia following cerebral infarction: Secondary | ICD-10-CM | POA: Diagnosis not present

## 2022-11-10 DIAGNOSIS — G8193 Hemiplegia, unspecified affecting right nondominant side: Secondary | ICD-10-CM | POA: Diagnosis not present

## 2022-11-10 DIAGNOSIS — I63322 Cerebral infarction due to thrombosis of left anterior cerebral artery: Secondary | ICD-10-CM | POA: Diagnosis not present

## 2022-11-10 DIAGNOSIS — I69891 Dysphagia following other cerebrovascular disease: Secondary | ICD-10-CM | POA: Diagnosis not present

## 2022-11-11 DIAGNOSIS — M24562 Contracture, left knee: Secondary | ICD-10-CM | POA: Diagnosis not present

## 2022-11-11 DIAGNOSIS — G20C Parkinsonism, unspecified: Secondary | ICD-10-CM | POA: Diagnosis not present

## 2022-11-11 DIAGNOSIS — M6259 Muscle wasting and atrophy, not elsewhere classified, multiple sites: Secondary | ICD-10-CM | POA: Diagnosis not present

## 2022-11-11 DIAGNOSIS — R1310 Dysphagia, unspecified: Secondary | ICD-10-CM | POA: Diagnosis not present

## 2022-11-11 DIAGNOSIS — Z7409 Other reduced mobility: Secondary | ICD-10-CM | POA: Diagnosis not present

## 2022-11-11 DIAGNOSIS — M24541 Contracture, right hand: Secondary | ICD-10-CM | POA: Diagnosis not present

## 2022-11-11 DIAGNOSIS — R4701 Aphasia: Secondary | ICD-10-CM | POA: Diagnosis not present

## 2022-11-11 DIAGNOSIS — R1319 Other dysphagia: Secondary | ICD-10-CM | POA: Diagnosis not present

## 2022-11-11 DIAGNOSIS — I63322 Cerebral infarction due to thrombosis of left anterior cerebral artery: Secondary | ICD-10-CM | POA: Diagnosis not present

## 2022-11-11 DIAGNOSIS — Z736 Limitation of activities due to disability: Secondary | ICD-10-CM | POA: Diagnosis not present

## 2022-11-11 DIAGNOSIS — Z23 Encounter for immunization: Secondary | ICD-10-CM | POA: Diagnosis not present

## 2022-11-11 DIAGNOSIS — E559 Vitamin D deficiency, unspecified: Secondary | ICD-10-CM | POA: Diagnosis not present

## 2022-11-11 DIAGNOSIS — N183 Chronic kidney disease, stage 3 unspecified: Secondary | ICD-10-CM | POA: Diagnosis not present

## 2022-11-11 DIAGNOSIS — K219 Gastro-esophageal reflux disease without esophagitis: Secondary | ICD-10-CM | POA: Diagnosis not present

## 2022-11-11 DIAGNOSIS — M8580 Other specified disorders of bone density and structure, unspecified site: Secondary | ICD-10-CM | POA: Diagnosis not present

## 2022-11-11 DIAGNOSIS — M170 Bilateral primary osteoarthritis of knee: Secondary | ICD-10-CM | POA: Diagnosis not present

## 2022-11-11 DIAGNOSIS — I69320 Aphasia following cerebral infarction: Secondary | ICD-10-CM | POA: Diagnosis not present

## 2022-11-11 DIAGNOSIS — E46 Unspecified protein-calorie malnutrition: Secondary | ICD-10-CM | POA: Diagnosis not present

## 2022-11-11 DIAGNOSIS — M24521 Contracture, right elbow: Secondary | ICD-10-CM | POA: Diagnosis not present

## 2022-11-11 DIAGNOSIS — M24561 Contracture, right knee: Secondary | ICD-10-CM | POA: Diagnosis not present

## 2022-11-11 DIAGNOSIS — I69918 Other symptoms and signs involving cognitive functions following unspecified cerebrovascular disease: Secondary | ICD-10-CM | POA: Diagnosis not present

## 2022-11-11 DIAGNOSIS — G8193 Hemiplegia, unspecified affecting right nondominant side: Secondary | ICD-10-CM | POA: Diagnosis not present

## 2022-11-11 DIAGNOSIS — E785 Hyperlipidemia, unspecified: Secondary | ICD-10-CM | POA: Diagnosis not present

## 2022-11-11 DIAGNOSIS — M24531 Contracture, right wrist: Secondary | ICD-10-CM | POA: Diagnosis not present

## 2022-11-12 DIAGNOSIS — I63322 Cerebral infarction due to thrombosis of left anterior cerebral artery: Secondary | ICD-10-CM | POA: Diagnosis not present

## 2022-11-12 DIAGNOSIS — I69918 Other symptoms and signs involving cognitive functions following unspecified cerebrovascular disease: Secondary | ICD-10-CM | POA: Diagnosis not present

## 2022-11-12 DIAGNOSIS — M24562 Contracture, left knee: Secondary | ICD-10-CM | POA: Diagnosis not present

## 2022-11-12 DIAGNOSIS — G20C Parkinsonism, unspecified: Secondary | ICD-10-CM | POA: Diagnosis not present

## 2022-11-12 DIAGNOSIS — I69320 Aphasia following cerebral infarction: Secondary | ICD-10-CM | POA: Diagnosis not present

## 2022-11-12 DIAGNOSIS — I10 Essential (primary) hypertension: Secondary | ICD-10-CM | POA: Diagnosis not present

## 2022-11-12 DIAGNOSIS — G8193 Hemiplegia, unspecified affecting right nondominant side: Secondary | ICD-10-CM | POA: Diagnosis not present

## 2022-11-13 DIAGNOSIS — I69320 Aphasia following cerebral infarction: Secondary | ICD-10-CM | POA: Diagnosis not present

## 2022-11-13 DIAGNOSIS — G8193 Hemiplegia, unspecified affecting right nondominant side: Secondary | ICD-10-CM | POA: Diagnosis not present

## 2022-11-13 DIAGNOSIS — M24562 Contracture, left knee: Secondary | ICD-10-CM | POA: Diagnosis not present

## 2022-11-13 DIAGNOSIS — G20C Parkinsonism, unspecified: Secondary | ICD-10-CM | POA: Diagnosis not present

## 2022-11-13 DIAGNOSIS — I69918 Other symptoms and signs involving cognitive functions following unspecified cerebrovascular disease: Secondary | ICD-10-CM | POA: Diagnosis not present

## 2022-11-13 DIAGNOSIS — I63322 Cerebral infarction due to thrombosis of left anterior cerebral artery: Secondary | ICD-10-CM | POA: Diagnosis not present

## 2022-11-14 DIAGNOSIS — I69918 Other symptoms and signs involving cognitive functions following unspecified cerebrovascular disease: Secondary | ICD-10-CM | POA: Diagnosis not present

## 2022-11-14 DIAGNOSIS — M24562 Contracture, left knee: Secondary | ICD-10-CM | POA: Diagnosis not present

## 2022-11-14 DIAGNOSIS — I63322 Cerebral infarction due to thrombosis of left anterior cerebral artery: Secondary | ICD-10-CM | POA: Diagnosis not present

## 2022-11-14 DIAGNOSIS — G8193 Hemiplegia, unspecified affecting right nondominant side: Secondary | ICD-10-CM | POA: Diagnosis not present

## 2022-11-14 DIAGNOSIS — I69320 Aphasia following cerebral infarction: Secondary | ICD-10-CM | POA: Diagnosis not present

## 2022-11-14 DIAGNOSIS — G20C Parkinsonism, unspecified: Secondary | ICD-10-CM | POA: Diagnosis not present

## 2022-11-17 DIAGNOSIS — G8193 Hemiplegia, unspecified affecting right nondominant side: Secondary | ICD-10-CM | POA: Diagnosis not present

## 2022-11-17 DIAGNOSIS — G20C Parkinsonism, unspecified: Secondary | ICD-10-CM | POA: Diagnosis not present

## 2022-11-17 DIAGNOSIS — M24562 Contracture, left knee: Secondary | ICD-10-CM | POA: Diagnosis not present

## 2022-11-17 DIAGNOSIS — I69918 Other symptoms and signs involving cognitive functions following unspecified cerebrovascular disease: Secondary | ICD-10-CM | POA: Diagnosis not present

## 2022-11-17 DIAGNOSIS — I63322 Cerebral infarction due to thrombosis of left anterior cerebral artery: Secondary | ICD-10-CM | POA: Diagnosis not present

## 2022-11-17 DIAGNOSIS — I69320 Aphasia following cerebral infarction: Secondary | ICD-10-CM | POA: Diagnosis not present

## 2022-11-18 DIAGNOSIS — I69918 Other symptoms and signs involving cognitive functions following unspecified cerebrovascular disease: Secondary | ICD-10-CM | POA: Diagnosis not present

## 2022-11-18 DIAGNOSIS — G20C Parkinsonism, unspecified: Secondary | ICD-10-CM | POA: Diagnosis not present

## 2022-11-18 DIAGNOSIS — M24562 Contracture, left knee: Secondary | ICD-10-CM | POA: Diagnosis not present

## 2022-11-18 DIAGNOSIS — G8193 Hemiplegia, unspecified affecting right nondominant side: Secondary | ICD-10-CM | POA: Diagnosis not present

## 2022-11-18 DIAGNOSIS — I63322 Cerebral infarction due to thrombosis of left anterior cerebral artery: Secondary | ICD-10-CM | POA: Diagnosis not present

## 2022-11-18 DIAGNOSIS — I69320 Aphasia following cerebral infarction: Secondary | ICD-10-CM | POA: Diagnosis not present

## 2022-11-19 DIAGNOSIS — M24562 Contracture, left knee: Secondary | ICD-10-CM | POA: Diagnosis not present

## 2022-11-19 DIAGNOSIS — G20C Parkinsonism, unspecified: Secondary | ICD-10-CM | POA: Diagnosis not present

## 2022-11-19 DIAGNOSIS — I69320 Aphasia following cerebral infarction: Secondary | ICD-10-CM | POA: Diagnosis not present

## 2022-11-19 DIAGNOSIS — I63322 Cerebral infarction due to thrombosis of left anterior cerebral artery: Secondary | ICD-10-CM | POA: Diagnosis not present

## 2022-11-19 DIAGNOSIS — G8193 Hemiplegia, unspecified affecting right nondominant side: Secondary | ICD-10-CM | POA: Diagnosis not present

## 2022-11-19 DIAGNOSIS — I69918 Other symptoms and signs involving cognitive functions following unspecified cerebrovascular disease: Secondary | ICD-10-CM | POA: Diagnosis not present

## 2022-11-20 DIAGNOSIS — G20C Parkinsonism, unspecified: Secondary | ICD-10-CM | POA: Diagnosis not present

## 2022-11-20 DIAGNOSIS — G8193 Hemiplegia, unspecified affecting right nondominant side: Secondary | ICD-10-CM | POA: Diagnosis not present

## 2022-11-20 DIAGNOSIS — M24562 Contracture, left knee: Secondary | ICD-10-CM | POA: Diagnosis not present

## 2022-11-20 DIAGNOSIS — I69320 Aphasia following cerebral infarction: Secondary | ICD-10-CM | POA: Diagnosis not present

## 2022-11-20 DIAGNOSIS — I63322 Cerebral infarction due to thrombosis of left anterior cerebral artery: Secondary | ICD-10-CM | POA: Diagnosis not present

## 2022-11-20 DIAGNOSIS — I69918 Other symptoms and signs involving cognitive functions following unspecified cerebrovascular disease: Secondary | ICD-10-CM | POA: Diagnosis not present

## 2022-11-21 DIAGNOSIS — M24562 Contracture, left knee: Secondary | ICD-10-CM | POA: Diagnosis not present

## 2022-11-21 DIAGNOSIS — I69320 Aphasia following cerebral infarction: Secondary | ICD-10-CM | POA: Diagnosis not present

## 2022-11-21 DIAGNOSIS — I69918 Other symptoms and signs involving cognitive functions following unspecified cerebrovascular disease: Secondary | ICD-10-CM | POA: Diagnosis not present

## 2022-11-21 DIAGNOSIS — I63322 Cerebral infarction due to thrombosis of left anterior cerebral artery: Secondary | ICD-10-CM | POA: Diagnosis not present

## 2022-11-21 DIAGNOSIS — G20C Parkinsonism, unspecified: Secondary | ICD-10-CM | POA: Diagnosis not present

## 2022-11-21 DIAGNOSIS — G8193 Hemiplegia, unspecified affecting right nondominant side: Secondary | ICD-10-CM | POA: Diagnosis not present

## 2022-11-25 DIAGNOSIS — I63322 Cerebral infarction due to thrombosis of left anterior cerebral artery: Secondary | ICD-10-CM | POA: Diagnosis not present

## 2022-11-25 DIAGNOSIS — H353134 Nonexudative age-related macular degeneration, bilateral, advanced atrophic with subfoveal involvement: Secondary | ICD-10-CM | POA: Diagnosis not present

## 2022-11-25 DIAGNOSIS — M24562 Contracture, left knee: Secondary | ICD-10-CM | POA: Diagnosis not present

## 2022-11-25 DIAGNOSIS — G20C Parkinsonism, unspecified: Secondary | ICD-10-CM | POA: Diagnosis not present

## 2022-11-25 DIAGNOSIS — Z961 Presence of intraocular lens: Secondary | ICD-10-CM | POA: Diagnosis not present

## 2022-11-25 DIAGNOSIS — G20A1 Parkinson's disease without dyskinesia, without mention of fluctuations: Secondary | ICD-10-CM | POA: Diagnosis not present

## 2022-11-25 DIAGNOSIS — G8193 Hemiplegia, unspecified affecting right nondominant side: Secondary | ICD-10-CM | POA: Diagnosis not present

## 2022-11-25 DIAGNOSIS — I69320 Aphasia following cerebral infarction: Secondary | ICD-10-CM | POA: Diagnosis not present

## 2022-11-25 DIAGNOSIS — I69918 Other symptoms and signs involving cognitive functions following unspecified cerebrovascular disease: Secondary | ICD-10-CM | POA: Diagnosis not present

## 2022-11-27 DIAGNOSIS — L84 Corns and callosities: Secondary | ICD-10-CM | POA: Diagnosis not present

## 2022-11-27 DIAGNOSIS — L603 Nail dystrophy: Secondary | ICD-10-CM | POA: Diagnosis not present

## 2022-11-27 DIAGNOSIS — L602 Onychogryphosis: Secondary | ICD-10-CM | POA: Diagnosis not present

## 2022-11-27 DIAGNOSIS — I739 Peripheral vascular disease, unspecified: Secondary | ICD-10-CM | POA: Diagnosis not present

## 2022-12-11 DIAGNOSIS — M24562 Contracture, left knee: Secondary | ICD-10-CM | POA: Diagnosis not present

## 2022-12-11 DIAGNOSIS — G20C Parkinsonism, unspecified: Secondary | ICD-10-CM | POA: Diagnosis not present

## 2022-12-11 DIAGNOSIS — I69918 Other symptoms and signs involving cognitive functions following unspecified cerebrovascular disease: Secondary | ICD-10-CM | POA: Diagnosis not present

## 2022-12-11 DIAGNOSIS — I69320 Aphasia following cerebral infarction: Secondary | ICD-10-CM | POA: Diagnosis not present

## 2022-12-11 DIAGNOSIS — N183 Chronic kidney disease, stage 3 unspecified: Secondary | ICD-10-CM | POA: Diagnosis not present

## 2022-12-11 DIAGNOSIS — I63322 Cerebral infarction due to thrombosis of left anterior cerebral artery: Secondary | ICD-10-CM | POA: Diagnosis not present

## 2022-12-11 DIAGNOSIS — G8193 Hemiplegia, unspecified affecting right nondominant side: Secondary | ICD-10-CM | POA: Diagnosis not present

## 2022-12-11 DIAGNOSIS — E46 Unspecified protein-calorie malnutrition: Secondary | ICD-10-CM | POA: Diagnosis not present

## 2022-12-12 DIAGNOSIS — E785 Hyperlipidemia, unspecified: Secondary | ICD-10-CM | POA: Diagnosis not present

## 2022-12-12 DIAGNOSIS — G8193 Hemiplegia, unspecified affecting right nondominant side: Secondary | ICD-10-CM | POA: Diagnosis not present

## 2022-12-12 DIAGNOSIS — M24531 Contracture, right wrist: Secondary | ICD-10-CM | POA: Diagnosis not present

## 2022-12-12 DIAGNOSIS — K219 Gastro-esophageal reflux disease without esophagitis: Secondary | ICD-10-CM | POA: Diagnosis not present

## 2022-12-12 DIAGNOSIS — I63322 Cerebral infarction due to thrombosis of left anterior cerebral artery: Secondary | ICD-10-CM | POA: Diagnosis not present

## 2022-12-12 DIAGNOSIS — E559 Vitamin D deficiency, unspecified: Secondary | ICD-10-CM | POA: Diagnosis not present

## 2022-12-12 DIAGNOSIS — R1310 Dysphagia, unspecified: Secondary | ICD-10-CM | POA: Diagnosis not present

## 2022-12-12 DIAGNOSIS — M170 Bilateral primary osteoarthritis of knee: Secondary | ICD-10-CM | POA: Diagnosis not present

## 2022-12-12 DIAGNOSIS — M8580 Other specified disorders of bone density and structure, unspecified site: Secondary | ICD-10-CM | POA: Diagnosis not present

## 2022-12-12 DIAGNOSIS — I69320 Aphasia following cerebral infarction: Secondary | ICD-10-CM | POA: Diagnosis not present

## 2022-12-12 DIAGNOSIS — E46 Unspecified protein-calorie malnutrition: Secondary | ICD-10-CM | POA: Diagnosis not present

## 2022-12-12 DIAGNOSIS — M24541 Contracture, right hand: Secondary | ICD-10-CM | POA: Diagnosis not present

## 2022-12-12 DIAGNOSIS — M24521 Contracture, right elbow: Secondary | ICD-10-CM | POA: Diagnosis not present

## 2022-12-12 DIAGNOSIS — Z7409 Other reduced mobility: Secondary | ICD-10-CM | POA: Diagnosis not present

## 2022-12-12 DIAGNOSIS — N183 Chronic kidney disease, stage 3 unspecified: Secondary | ICD-10-CM | POA: Diagnosis not present

## 2022-12-12 DIAGNOSIS — I69891 Dysphagia following other cerebrovascular disease: Secondary | ICD-10-CM | POA: Diagnosis not present

## 2022-12-12 DIAGNOSIS — I10 Essential (primary) hypertension: Secondary | ICD-10-CM | POA: Diagnosis not present

## 2022-12-12 DIAGNOSIS — R1319 Other dysphagia: Secondary | ICD-10-CM | POA: Diagnosis not present

## 2022-12-12 DIAGNOSIS — Z736 Limitation of activities due to disability: Secondary | ICD-10-CM | POA: Diagnosis not present

## 2022-12-12 DIAGNOSIS — G20C Parkinsonism, unspecified: Secondary | ICD-10-CM | POA: Diagnosis not present

## 2022-12-12 DIAGNOSIS — M6259 Muscle wasting and atrophy, not elsewhere classified, multiple sites: Secondary | ICD-10-CM | POA: Diagnosis not present

## 2022-12-12 DIAGNOSIS — M24561 Contracture, right knee: Secondary | ICD-10-CM | POA: Diagnosis not present

## 2022-12-12 DIAGNOSIS — R4701 Aphasia: Secondary | ICD-10-CM | POA: Diagnosis not present

## 2022-12-12 DIAGNOSIS — M24562 Contracture, left knee: Secondary | ICD-10-CM | POA: Diagnosis not present

## 2022-12-12 DIAGNOSIS — I69918 Other symptoms and signs involving cognitive functions following unspecified cerebrovascular disease: Secondary | ICD-10-CM | POA: Diagnosis not present

## 2022-12-15 DIAGNOSIS — G8193 Hemiplegia, unspecified affecting right nondominant side: Secondary | ICD-10-CM | POA: Diagnosis not present

## 2022-12-15 DIAGNOSIS — I69891 Dysphagia following other cerebrovascular disease: Secondary | ICD-10-CM | POA: Diagnosis not present

## 2022-12-15 DIAGNOSIS — I69320 Aphasia following cerebral infarction: Secondary | ICD-10-CM | POA: Diagnosis not present

## 2022-12-15 DIAGNOSIS — M24562 Contracture, left knee: Secondary | ICD-10-CM | POA: Diagnosis not present

## 2022-12-15 DIAGNOSIS — I63322 Cerebral infarction due to thrombosis of left anterior cerebral artery: Secondary | ICD-10-CM | POA: Diagnosis not present

## 2022-12-15 DIAGNOSIS — G20C Parkinsonism, unspecified: Secondary | ICD-10-CM | POA: Diagnosis not present

## 2022-12-16 DIAGNOSIS — M24562 Contracture, left knee: Secondary | ICD-10-CM | POA: Diagnosis not present

## 2022-12-16 DIAGNOSIS — G8193 Hemiplegia, unspecified affecting right nondominant side: Secondary | ICD-10-CM | POA: Diagnosis not present

## 2022-12-16 DIAGNOSIS — G20C Parkinsonism, unspecified: Secondary | ICD-10-CM | POA: Diagnosis not present

## 2022-12-16 DIAGNOSIS — I69891 Dysphagia following other cerebrovascular disease: Secondary | ICD-10-CM | POA: Diagnosis not present

## 2022-12-16 DIAGNOSIS — I63322 Cerebral infarction due to thrombosis of left anterior cerebral artery: Secondary | ICD-10-CM | POA: Diagnosis not present

## 2022-12-16 DIAGNOSIS — I69320 Aphasia following cerebral infarction: Secondary | ICD-10-CM | POA: Diagnosis not present

## 2022-12-17 DIAGNOSIS — I69320 Aphasia following cerebral infarction: Secondary | ICD-10-CM | POA: Diagnosis not present

## 2022-12-17 DIAGNOSIS — M24562 Contracture, left knee: Secondary | ICD-10-CM | POA: Diagnosis not present

## 2022-12-17 DIAGNOSIS — I69891 Dysphagia following other cerebrovascular disease: Secondary | ICD-10-CM | POA: Diagnosis not present

## 2022-12-17 DIAGNOSIS — G8193 Hemiplegia, unspecified affecting right nondominant side: Secondary | ICD-10-CM | POA: Diagnosis not present

## 2022-12-17 DIAGNOSIS — G20C Parkinsonism, unspecified: Secondary | ICD-10-CM | POA: Diagnosis not present

## 2022-12-17 DIAGNOSIS — I63322 Cerebral infarction due to thrombosis of left anterior cerebral artery: Secondary | ICD-10-CM | POA: Diagnosis not present

## 2022-12-18 DIAGNOSIS — I69320 Aphasia following cerebral infarction: Secondary | ICD-10-CM | POA: Diagnosis not present

## 2022-12-18 DIAGNOSIS — M24562 Contracture, left knee: Secondary | ICD-10-CM | POA: Diagnosis not present

## 2022-12-18 DIAGNOSIS — G8193 Hemiplegia, unspecified affecting right nondominant side: Secondary | ICD-10-CM | POA: Diagnosis not present

## 2022-12-18 DIAGNOSIS — I69891 Dysphagia following other cerebrovascular disease: Secondary | ICD-10-CM | POA: Diagnosis not present

## 2022-12-18 DIAGNOSIS — I63322 Cerebral infarction due to thrombosis of left anterior cerebral artery: Secondary | ICD-10-CM | POA: Diagnosis not present

## 2022-12-18 DIAGNOSIS — G20C Parkinsonism, unspecified: Secondary | ICD-10-CM | POA: Diagnosis not present

## 2022-12-19 DIAGNOSIS — G8193 Hemiplegia, unspecified affecting right nondominant side: Secondary | ICD-10-CM | POA: Diagnosis not present

## 2022-12-19 DIAGNOSIS — I69320 Aphasia following cerebral infarction: Secondary | ICD-10-CM | POA: Diagnosis not present

## 2022-12-19 DIAGNOSIS — M24562 Contracture, left knee: Secondary | ICD-10-CM | POA: Diagnosis not present

## 2022-12-19 DIAGNOSIS — I69891 Dysphagia following other cerebrovascular disease: Secondary | ICD-10-CM | POA: Diagnosis not present

## 2022-12-19 DIAGNOSIS — I63322 Cerebral infarction due to thrombosis of left anterior cerebral artery: Secondary | ICD-10-CM | POA: Diagnosis not present

## 2022-12-19 DIAGNOSIS — G20C Parkinsonism, unspecified: Secondary | ICD-10-CM | POA: Diagnosis not present

## 2022-12-22 DIAGNOSIS — I69320 Aphasia following cerebral infarction: Secondary | ICD-10-CM | POA: Diagnosis not present

## 2022-12-22 DIAGNOSIS — G20C Parkinsonism, unspecified: Secondary | ICD-10-CM | POA: Diagnosis not present

## 2022-12-22 DIAGNOSIS — I63322 Cerebral infarction due to thrombosis of left anterior cerebral artery: Secondary | ICD-10-CM | POA: Diagnosis not present

## 2022-12-22 DIAGNOSIS — I69891 Dysphagia following other cerebrovascular disease: Secondary | ICD-10-CM | POA: Diagnosis not present

## 2022-12-22 DIAGNOSIS — M24562 Contracture, left knee: Secondary | ICD-10-CM | POA: Diagnosis not present

## 2022-12-22 DIAGNOSIS — G8193 Hemiplegia, unspecified affecting right nondominant side: Secondary | ICD-10-CM | POA: Diagnosis not present

## 2022-12-23 DIAGNOSIS — G8193 Hemiplegia, unspecified affecting right nondominant side: Secondary | ICD-10-CM | POA: Diagnosis not present

## 2022-12-23 DIAGNOSIS — I69891 Dysphagia following other cerebrovascular disease: Secondary | ICD-10-CM | POA: Diagnosis not present

## 2022-12-23 DIAGNOSIS — I63322 Cerebral infarction due to thrombosis of left anterior cerebral artery: Secondary | ICD-10-CM | POA: Diagnosis not present

## 2022-12-23 DIAGNOSIS — I69320 Aphasia following cerebral infarction: Secondary | ICD-10-CM | POA: Diagnosis not present

## 2022-12-23 DIAGNOSIS — G20C Parkinsonism, unspecified: Secondary | ICD-10-CM | POA: Diagnosis not present

## 2022-12-23 DIAGNOSIS — M24562 Contracture, left knee: Secondary | ICD-10-CM | POA: Diagnosis not present

## 2022-12-24 DIAGNOSIS — I69891 Dysphagia following other cerebrovascular disease: Secondary | ICD-10-CM | POA: Diagnosis not present

## 2022-12-24 DIAGNOSIS — I69320 Aphasia following cerebral infarction: Secondary | ICD-10-CM | POA: Diagnosis not present

## 2022-12-24 DIAGNOSIS — I63322 Cerebral infarction due to thrombosis of left anterior cerebral artery: Secondary | ICD-10-CM | POA: Diagnosis not present

## 2022-12-24 DIAGNOSIS — G8193 Hemiplegia, unspecified affecting right nondominant side: Secondary | ICD-10-CM | POA: Diagnosis not present

## 2022-12-24 DIAGNOSIS — G20C Parkinsonism, unspecified: Secondary | ICD-10-CM | POA: Diagnosis not present

## 2022-12-24 DIAGNOSIS — M24562 Contracture, left knee: Secondary | ICD-10-CM | POA: Diagnosis not present

## 2022-12-25 DIAGNOSIS — I63322 Cerebral infarction due to thrombosis of left anterior cerebral artery: Secondary | ICD-10-CM | POA: Diagnosis not present

## 2022-12-25 DIAGNOSIS — G20C Parkinsonism, unspecified: Secondary | ICD-10-CM | POA: Diagnosis not present

## 2022-12-25 DIAGNOSIS — I69320 Aphasia following cerebral infarction: Secondary | ICD-10-CM | POA: Diagnosis not present

## 2022-12-25 DIAGNOSIS — M24562 Contracture, left knee: Secondary | ICD-10-CM | POA: Diagnosis not present

## 2022-12-25 DIAGNOSIS — G8193 Hemiplegia, unspecified affecting right nondominant side: Secondary | ICD-10-CM | POA: Diagnosis not present

## 2022-12-25 DIAGNOSIS — I69891 Dysphagia following other cerebrovascular disease: Secondary | ICD-10-CM | POA: Diagnosis not present

## 2022-12-26 DIAGNOSIS — I63322 Cerebral infarction due to thrombosis of left anterior cerebral artery: Secondary | ICD-10-CM | POA: Diagnosis not present

## 2022-12-26 DIAGNOSIS — M24562 Contracture, left knee: Secondary | ICD-10-CM | POA: Diagnosis not present

## 2022-12-26 DIAGNOSIS — G20C Parkinsonism, unspecified: Secondary | ICD-10-CM | POA: Diagnosis not present

## 2022-12-26 DIAGNOSIS — G8193 Hemiplegia, unspecified affecting right nondominant side: Secondary | ICD-10-CM | POA: Diagnosis not present

## 2022-12-26 DIAGNOSIS — I69320 Aphasia following cerebral infarction: Secondary | ICD-10-CM | POA: Diagnosis not present

## 2022-12-26 DIAGNOSIS — I69891 Dysphagia following other cerebrovascular disease: Secondary | ICD-10-CM | POA: Diagnosis not present

## 2022-12-29 DIAGNOSIS — G8193 Hemiplegia, unspecified affecting right nondominant side: Secondary | ICD-10-CM | POA: Diagnosis not present

## 2022-12-29 DIAGNOSIS — G20C Parkinsonism, unspecified: Secondary | ICD-10-CM | POA: Diagnosis not present

## 2022-12-29 DIAGNOSIS — I63322 Cerebral infarction due to thrombosis of left anterior cerebral artery: Secondary | ICD-10-CM | POA: Diagnosis not present

## 2022-12-29 DIAGNOSIS — I69320 Aphasia following cerebral infarction: Secondary | ICD-10-CM | POA: Diagnosis not present

## 2022-12-29 DIAGNOSIS — M24562 Contracture, left knee: Secondary | ICD-10-CM | POA: Diagnosis not present

## 2022-12-29 DIAGNOSIS — I69891 Dysphagia following other cerebrovascular disease: Secondary | ICD-10-CM | POA: Diagnosis not present

## 2022-12-30 DIAGNOSIS — I69320 Aphasia following cerebral infarction: Secondary | ICD-10-CM | POA: Diagnosis not present

## 2022-12-30 DIAGNOSIS — I69891 Dysphagia following other cerebrovascular disease: Secondary | ICD-10-CM | POA: Diagnosis not present

## 2022-12-30 DIAGNOSIS — G20C Parkinsonism, unspecified: Secondary | ICD-10-CM | POA: Diagnosis not present

## 2022-12-30 DIAGNOSIS — M24562 Contracture, left knee: Secondary | ICD-10-CM | POA: Diagnosis not present

## 2022-12-30 DIAGNOSIS — I63322 Cerebral infarction due to thrombosis of left anterior cerebral artery: Secondary | ICD-10-CM | POA: Diagnosis not present

## 2022-12-30 DIAGNOSIS — G8193 Hemiplegia, unspecified affecting right nondominant side: Secondary | ICD-10-CM | POA: Diagnosis not present

## 2022-12-31 DIAGNOSIS — I69891 Dysphagia following other cerebrovascular disease: Secondary | ICD-10-CM | POA: Diagnosis not present

## 2022-12-31 DIAGNOSIS — G20C Parkinsonism, unspecified: Secondary | ICD-10-CM | POA: Diagnosis not present

## 2022-12-31 DIAGNOSIS — M24562 Contracture, left knee: Secondary | ICD-10-CM | POA: Diagnosis not present

## 2022-12-31 DIAGNOSIS — I63322 Cerebral infarction due to thrombosis of left anterior cerebral artery: Secondary | ICD-10-CM | POA: Diagnosis not present

## 2022-12-31 DIAGNOSIS — G8193 Hemiplegia, unspecified affecting right nondominant side: Secondary | ICD-10-CM | POA: Diagnosis not present

## 2022-12-31 DIAGNOSIS — I69320 Aphasia following cerebral infarction: Secondary | ICD-10-CM | POA: Diagnosis not present

## 2022-12-31 DIAGNOSIS — N390 Urinary tract infection, site not specified: Secondary | ICD-10-CM | POA: Diagnosis not present

## 2023-01-01 DIAGNOSIS — M24562 Contracture, left knee: Secondary | ICD-10-CM | POA: Diagnosis not present

## 2023-01-01 DIAGNOSIS — I69320 Aphasia following cerebral infarction: Secondary | ICD-10-CM | POA: Diagnosis not present

## 2023-01-01 DIAGNOSIS — G8193 Hemiplegia, unspecified affecting right nondominant side: Secondary | ICD-10-CM | POA: Diagnosis not present

## 2023-01-01 DIAGNOSIS — I63322 Cerebral infarction due to thrombosis of left anterior cerebral artery: Secondary | ICD-10-CM | POA: Diagnosis not present

## 2023-01-01 DIAGNOSIS — I69891 Dysphagia following other cerebrovascular disease: Secondary | ICD-10-CM | POA: Diagnosis not present

## 2023-01-01 DIAGNOSIS — G20C Parkinsonism, unspecified: Secondary | ICD-10-CM | POA: Diagnosis not present

## 2023-01-02 DIAGNOSIS — M24562 Contracture, left knee: Secondary | ICD-10-CM | POA: Diagnosis not present

## 2023-01-02 DIAGNOSIS — G20C Parkinsonism, unspecified: Secondary | ICD-10-CM | POA: Diagnosis not present

## 2023-01-02 DIAGNOSIS — I63322 Cerebral infarction due to thrombosis of left anterior cerebral artery: Secondary | ICD-10-CM | POA: Diagnosis not present

## 2023-01-02 DIAGNOSIS — I69320 Aphasia following cerebral infarction: Secondary | ICD-10-CM | POA: Diagnosis not present

## 2023-01-02 DIAGNOSIS — I69891 Dysphagia following other cerebrovascular disease: Secondary | ICD-10-CM | POA: Diagnosis not present

## 2023-01-02 DIAGNOSIS — G8193 Hemiplegia, unspecified affecting right nondominant side: Secondary | ICD-10-CM | POA: Diagnosis not present

## 2023-01-04 DIAGNOSIS — G8193 Hemiplegia, unspecified affecting right nondominant side: Secondary | ICD-10-CM | POA: Diagnosis not present

## 2023-01-04 DIAGNOSIS — G20C Parkinsonism, unspecified: Secondary | ICD-10-CM | POA: Diagnosis not present

## 2023-01-04 DIAGNOSIS — I69891 Dysphagia following other cerebrovascular disease: Secondary | ICD-10-CM | POA: Diagnosis not present

## 2023-01-04 DIAGNOSIS — I69320 Aphasia following cerebral infarction: Secondary | ICD-10-CM | POA: Diagnosis not present

## 2023-01-04 DIAGNOSIS — I63322 Cerebral infarction due to thrombosis of left anterior cerebral artery: Secondary | ICD-10-CM | POA: Diagnosis not present

## 2023-01-04 DIAGNOSIS — M24562 Contracture, left knee: Secondary | ICD-10-CM | POA: Diagnosis not present

## 2023-01-05 DIAGNOSIS — G20C Parkinsonism, unspecified: Secondary | ICD-10-CM | POA: Diagnosis not present

## 2023-01-05 DIAGNOSIS — M24562 Contracture, left knee: Secondary | ICD-10-CM | POA: Diagnosis not present

## 2023-01-05 DIAGNOSIS — I63322 Cerebral infarction due to thrombosis of left anterior cerebral artery: Secondary | ICD-10-CM | POA: Diagnosis not present

## 2023-01-05 DIAGNOSIS — G8193 Hemiplegia, unspecified affecting right nondominant side: Secondary | ICD-10-CM | POA: Diagnosis not present

## 2023-01-05 DIAGNOSIS — I69320 Aphasia following cerebral infarction: Secondary | ICD-10-CM | POA: Diagnosis not present

## 2023-01-05 DIAGNOSIS — I69891 Dysphagia following other cerebrovascular disease: Secondary | ICD-10-CM | POA: Diagnosis not present

## 2023-01-06 DIAGNOSIS — I69891 Dysphagia following other cerebrovascular disease: Secondary | ICD-10-CM | POA: Diagnosis not present

## 2023-01-06 DIAGNOSIS — I63322 Cerebral infarction due to thrombosis of left anterior cerebral artery: Secondary | ICD-10-CM | POA: Diagnosis not present

## 2023-01-06 DIAGNOSIS — I69320 Aphasia following cerebral infarction: Secondary | ICD-10-CM | POA: Diagnosis not present

## 2023-01-06 DIAGNOSIS — G8193 Hemiplegia, unspecified affecting right nondominant side: Secondary | ICD-10-CM | POA: Diagnosis not present

## 2023-01-06 DIAGNOSIS — G20C Parkinsonism, unspecified: Secondary | ICD-10-CM | POA: Diagnosis not present

## 2023-01-06 DIAGNOSIS — M24562 Contracture, left knee: Secondary | ICD-10-CM | POA: Diagnosis not present

## 2023-01-08 DIAGNOSIS — I69891 Dysphagia following other cerebrovascular disease: Secondary | ICD-10-CM | POA: Diagnosis not present

## 2023-01-08 DIAGNOSIS — G8193 Hemiplegia, unspecified affecting right nondominant side: Secondary | ICD-10-CM | POA: Diagnosis not present

## 2023-01-08 DIAGNOSIS — M24562 Contracture, left knee: Secondary | ICD-10-CM | POA: Diagnosis not present

## 2023-01-08 DIAGNOSIS — G20C Parkinsonism, unspecified: Secondary | ICD-10-CM | POA: Diagnosis not present

## 2023-01-08 DIAGNOSIS — I63322 Cerebral infarction due to thrombosis of left anterior cerebral artery: Secondary | ICD-10-CM | POA: Diagnosis not present

## 2023-01-08 DIAGNOSIS — I69320 Aphasia following cerebral infarction: Secondary | ICD-10-CM | POA: Diagnosis not present

## 2023-01-10 DIAGNOSIS — I69891 Dysphagia following other cerebrovascular disease: Secondary | ICD-10-CM | POA: Diagnosis not present

## 2023-01-10 DIAGNOSIS — G20C Parkinsonism, unspecified: Secondary | ICD-10-CM | POA: Diagnosis not present

## 2023-01-10 DIAGNOSIS — I63322 Cerebral infarction due to thrombosis of left anterior cerebral artery: Secondary | ICD-10-CM | POA: Diagnosis not present

## 2023-01-10 DIAGNOSIS — I69320 Aphasia following cerebral infarction: Secondary | ICD-10-CM | POA: Diagnosis not present

## 2023-01-10 DIAGNOSIS — M24562 Contracture, left knee: Secondary | ICD-10-CM | POA: Diagnosis not present

## 2023-01-10 DIAGNOSIS — G8193 Hemiplegia, unspecified affecting right nondominant side: Secondary | ICD-10-CM | POA: Diagnosis not present

## 2023-01-12 DIAGNOSIS — Z7409 Other reduced mobility: Secondary | ICD-10-CM | POA: Diagnosis not present

## 2023-01-12 DIAGNOSIS — M24541 Contracture, right hand: Secondary | ICD-10-CM | POA: Diagnosis not present

## 2023-01-12 DIAGNOSIS — I10 Essential (primary) hypertension: Secondary | ICD-10-CM | POA: Diagnosis not present

## 2023-01-12 DIAGNOSIS — G8193 Hemiplegia, unspecified affecting right nondominant side: Secondary | ICD-10-CM | POA: Diagnosis not present

## 2023-01-12 DIAGNOSIS — E785 Hyperlipidemia, unspecified: Secondary | ICD-10-CM | POA: Diagnosis not present

## 2023-01-12 DIAGNOSIS — R1319 Other dysphagia: Secondary | ICD-10-CM | POA: Diagnosis not present

## 2023-01-12 DIAGNOSIS — I63322 Cerebral infarction due to thrombosis of left anterior cerebral artery: Secondary | ICD-10-CM | POA: Diagnosis not present

## 2023-01-12 DIAGNOSIS — R1310 Dysphagia, unspecified: Secondary | ICD-10-CM | POA: Diagnosis not present

## 2023-01-12 DIAGNOSIS — I69918 Other symptoms and signs involving cognitive functions following unspecified cerebrovascular disease: Secondary | ICD-10-CM | POA: Diagnosis not present

## 2023-01-12 DIAGNOSIS — R4701 Aphasia: Secondary | ICD-10-CM | POA: Diagnosis not present

## 2023-01-12 DIAGNOSIS — M24562 Contracture, left knee: Secondary | ICD-10-CM | POA: Diagnosis not present

## 2023-01-12 DIAGNOSIS — I69320 Aphasia following cerebral infarction: Secondary | ICD-10-CM | POA: Diagnosis not present

## 2023-01-12 DIAGNOSIS — N183 Chronic kidney disease, stage 3 unspecified: Secondary | ICD-10-CM | POA: Diagnosis not present

## 2023-01-12 DIAGNOSIS — I69891 Dysphagia following other cerebrovascular disease: Secondary | ICD-10-CM | POA: Diagnosis not present

## 2023-01-12 DIAGNOSIS — M24521 Contracture, right elbow: Secondary | ICD-10-CM | POA: Diagnosis not present

## 2023-01-12 DIAGNOSIS — M24531 Contracture, right wrist: Secondary | ICD-10-CM | POA: Diagnosis not present

## 2023-01-12 DIAGNOSIS — M24561 Contracture, right knee: Secondary | ICD-10-CM | POA: Diagnosis not present

## 2023-01-12 DIAGNOSIS — Z736 Limitation of activities due to disability: Secondary | ICD-10-CM | POA: Diagnosis not present

## 2023-01-12 DIAGNOSIS — M170 Bilateral primary osteoarthritis of knee: Secondary | ICD-10-CM | POA: Diagnosis not present

## 2023-01-12 DIAGNOSIS — M8580 Other specified disorders of bone density and structure, unspecified site: Secondary | ICD-10-CM | POA: Diagnosis not present

## 2023-01-12 DIAGNOSIS — E46 Unspecified protein-calorie malnutrition: Secondary | ICD-10-CM | POA: Diagnosis not present

## 2023-01-12 DIAGNOSIS — K219 Gastro-esophageal reflux disease without esophagitis: Secondary | ICD-10-CM | POA: Diagnosis not present

## 2023-01-12 DIAGNOSIS — E559 Vitamin D deficiency, unspecified: Secondary | ICD-10-CM | POA: Diagnosis not present

## 2023-01-12 DIAGNOSIS — G20C Parkinsonism, unspecified: Secondary | ICD-10-CM | POA: Diagnosis not present

## 2023-01-12 DIAGNOSIS — M6259 Muscle wasting and atrophy, not elsewhere classified, multiple sites: Secondary | ICD-10-CM | POA: Diagnosis not present

## 2023-01-13 DIAGNOSIS — I63322 Cerebral infarction due to thrombosis of left anterior cerebral artery: Secondary | ICD-10-CM | POA: Diagnosis not present

## 2023-01-13 DIAGNOSIS — I69891 Dysphagia following other cerebrovascular disease: Secondary | ICD-10-CM | POA: Diagnosis not present

## 2023-01-13 DIAGNOSIS — M24562 Contracture, left knee: Secondary | ICD-10-CM | POA: Diagnosis not present

## 2023-01-13 DIAGNOSIS — I69320 Aphasia following cerebral infarction: Secondary | ICD-10-CM | POA: Diagnosis not present

## 2023-01-13 DIAGNOSIS — G8193 Hemiplegia, unspecified affecting right nondominant side: Secondary | ICD-10-CM | POA: Diagnosis not present

## 2023-01-13 DIAGNOSIS — G20C Parkinsonism, unspecified: Secondary | ICD-10-CM | POA: Diagnosis not present

## 2023-01-14 DIAGNOSIS — N183 Chronic kidney disease, stage 3 unspecified: Secondary | ICD-10-CM | POA: Diagnosis not present

## 2023-01-14 DIAGNOSIS — I63322 Cerebral infarction due to thrombosis of left anterior cerebral artery: Secondary | ICD-10-CM | POA: Diagnosis not present

## 2023-01-14 DIAGNOSIS — G20C Parkinsonism, unspecified: Secondary | ICD-10-CM | POA: Diagnosis not present

## 2023-01-14 DIAGNOSIS — M24562 Contracture, left knee: Secondary | ICD-10-CM | POA: Diagnosis not present

## 2023-01-14 DIAGNOSIS — G8193 Hemiplegia, unspecified affecting right nondominant side: Secondary | ICD-10-CM | POA: Diagnosis not present

## 2023-01-14 DIAGNOSIS — I69891 Dysphagia following other cerebrovascular disease: Secondary | ICD-10-CM | POA: Diagnosis not present

## 2023-01-14 DIAGNOSIS — E46 Unspecified protein-calorie malnutrition: Secondary | ICD-10-CM | POA: Diagnosis not present

## 2023-01-14 DIAGNOSIS — I69320 Aphasia following cerebral infarction: Secondary | ICD-10-CM | POA: Diagnosis not present

## 2023-01-15 DIAGNOSIS — M24562 Contracture, left knee: Secondary | ICD-10-CM | POA: Diagnosis not present

## 2023-01-15 DIAGNOSIS — I63322 Cerebral infarction due to thrombosis of left anterior cerebral artery: Secondary | ICD-10-CM | POA: Diagnosis not present

## 2023-01-15 DIAGNOSIS — G8193 Hemiplegia, unspecified affecting right nondominant side: Secondary | ICD-10-CM | POA: Diagnosis not present

## 2023-01-15 DIAGNOSIS — I69891 Dysphagia following other cerebrovascular disease: Secondary | ICD-10-CM | POA: Diagnosis not present

## 2023-01-15 DIAGNOSIS — I69320 Aphasia following cerebral infarction: Secondary | ICD-10-CM | POA: Diagnosis not present

## 2023-01-15 DIAGNOSIS — G20C Parkinsonism, unspecified: Secondary | ICD-10-CM | POA: Diagnosis not present

## 2023-01-16 DIAGNOSIS — G8193 Hemiplegia, unspecified affecting right nondominant side: Secondary | ICD-10-CM | POA: Diagnosis not present

## 2023-01-16 DIAGNOSIS — I63322 Cerebral infarction due to thrombosis of left anterior cerebral artery: Secondary | ICD-10-CM | POA: Diagnosis not present

## 2023-01-16 DIAGNOSIS — G20C Parkinsonism, unspecified: Secondary | ICD-10-CM | POA: Diagnosis not present

## 2023-01-16 DIAGNOSIS — M24562 Contracture, left knee: Secondary | ICD-10-CM | POA: Diagnosis not present

## 2023-01-16 DIAGNOSIS — I69891 Dysphagia following other cerebrovascular disease: Secondary | ICD-10-CM | POA: Diagnosis not present

## 2023-01-16 DIAGNOSIS — I69320 Aphasia following cerebral infarction: Secondary | ICD-10-CM | POA: Diagnosis not present

## 2023-01-19 DIAGNOSIS — I69891 Dysphagia following other cerebrovascular disease: Secondary | ICD-10-CM | POA: Diagnosis not present

## 2023-01-19 DIAGNOSIS — I63322 Cerebral infarction due to thrombosis of left anterior cerebral artery: Secondary | ICD-10-CM | POA: Diagnosis not present

## 2023-01-19 DIAGNOSIS — M24562 Contracture, left knee: Secondary | ICD-10-CM | POA: Diagnosis not present

## 2023-01-19 DIAGNOSIS — G8193 Hemiplegia, unspecified affecting right nondominant side: Secondary | ICD-10-CM | POA: Diagnosis not present

## 2023-01-19 DIAGNOSIS — G20C Parkinsonism, unspecified: Secondary | ICD-10-CM | POA: Diagnosis not present

## 2023-01-19 DIAGNOSIS — I69320 Aphasia following cerebral infarction: Secondary | ICD-10-CM | POA: Diagnosis not present

## 2023-01-26 DIAGNOSIS — R140 Abdominal distension (gaseous): Secondary | ICD-10-CM | POA: Diagnosis not present

## 2023-01-26 DIAGNOSIS — R109 Unspecified abdominal pain: Secondary | ICD-10-CM | POA: Diagnosis not present

## 2023-01-27 DIAGNOSIS — K5900 Constipation, unspecified: Secondary | ICD-10-CM | POA: Diagnosis not present

## 2023-01-27 DIAGNOSIS — S22000A Wedge compression fracture of unspecified thoracic vertebra, initial encounter for closed fracture: Secondary | ICD-10-CM | POA: Diagnosis not present

## 2023-01-27 DIAGNOSIS — M810 Age-related osteoporosis without current pathological fracture: Secondary | ICD-10-CM | POA: Diagnosis not present

## 2023-02-03 DIAGNOSIS — D649 Anemia, unspecified: Secondary | ICD-10-CM | POA: Diagnosis not present
